# Patient Record
Sex: Female | Born: 1955 | Race: Black or African American | Hispanic: No | Marital: Single | Smoking: Never smoker
Health system: Southern US, Community
[De-identification: ages and names within clinical notes are randomized; demographics above are authoritative.]

## PROBLEM LIST (undated history)

## (undated) DIAGNOSIS — I10 Essential (primary) hypertension: Secondary | ICD-10-CM

## (undated) DIAGNOSIS — H409 Unspecified glaucoma: Secondary | ICD-10-CM

## (undated) DIAGNOSIS — F419 Anxiety disorder, unspecified: Secondary | ICD-10-CM

## (undated) DIAGNOSIS — T7840XA Allergy, unspecified, initial encounter: Secondary | ICD-10-CM

## (undated) DIAGNOSIS — M1990 Unspecified osteoarthritis, unspecified site: Secondary | ICD-10-CM

## (undated) HISTORY — DX: Allergy, unspecified, initial encounter: T78.40XA

## (undated) HISTORY — DX: Unspecified glaucoma: H40.9

## (undated) HISTORY — DX: Anxiety disorder, unspecified: F41.9

## (undated) HISTORY — DX: Essential (primary) hypertension: I10

## (undated) HISTORY — PX: LAPAROSCOPY: SHX197

## (undated) HISTORY — PX: DILATION AND CURETTAGE OF UTERUS: SHX78

---

## 1979-08-15 HISTORY — PX: OTHER SURGICAL HISTORY: SHX169

## 1999-06-29 ENCOUNTER — Emergency Department (HOSPITAL_COMMUNITY): Admission: EM | Admit: 1999-06-29 | Discharge: 1999-06-30 | Payer: Self-pay

## 1999-06-30 ENCOUNTER — Encounter: Payer: Self-pay | Admitting: Emergency Medicine

## 1999-06-30 ENCOUNTER — Ambulatory Visit (HOSPITAL_COMMUNITY): Admission: RE | Admit: 1999-06-30 | Discharge: 1999-06-30 | Payer: Self-pay | Admitting: Emergency Medicine

## 1999-07-09 ENCOUNTER — Other Ambulatory Visit: Admission: RE | Admit: 1999-07-09 | Discharge: 1999-07-09 | Payer: Self-pay | Admitting: Obstetrics & Gynecology

## 2000-04-15 ENCOUNTER — Emergency Department (HOSPITAL_COMMUNITY): Admission: EM | Admit: 2000-04-15 | Discharge: 2000-04-15 | Payer: Self-pay | Admitting: Emergency Medicine

## 2000-04-15 ENCOUNTER — Encounter: Payer: Self-pay | Admitting: Emergency Medicine

## 2000-04-24 ENCOUNTER — Encounter: Payer: Self-pay | Admitting: Emergency Medicine

## 2000-04-24 ENCOUNTER — Emergency Department (HOSPITAL_COMMUNITY): Admission: EM | Admit: 2000-04-24 | Discharge: 2000-04-24 | Payer: Self-pay | Admitting: Emergency Medicine

## 2000-09-20 ENCOUNTER — Other Ambulatory Visit: Admission: RE | Admit: 2000-09-20 | Discharge: 2000-09-20 | Payer: Self-pay | Admitting: Gynecology

## 2000-10-27 ENCOUNTER — Emergency Department (HOSPITAL_COMMUNITY): Admission: EM | Admit: 2000-10-27 | Discharge: 2000-10-27 | Payer: Self-pay | Admitting: Emergency Medicine

## 2000-10-29 ENCOUNTER — Encounter: Payer: Self-pay | Admitting: Family Medicine

## 2000-10-29 ENCOUNTER — Ambulatory Visit (HOSPITAL_COMMUNITY): Admission: RE | Admit: 2000-10-29 | Discharge: 2000-10-29 | Payer: Self-pay | Admitting: Family Medicine

## 2000-11-24 ENCOUNTER — Encounter: Payer: Self-pay | Admitting: Family Medicine

## 2000-11-24 ENCOUNTER — Ambulatory Visit (HOSPITAL_COMMUNITY): Admission: RE | Admit: 2000-11-24 | Discharge: 2000-11-24 | Payer: Self-pay | Admitting: Family Medicine

## 2001-10-30 ENCOUNTER — Encounter: Payer: Self-pay | Admitting: Emergency Medicine

## 2001-10-30 ENCOUNTER — Emergency Department (HOSPITAL_COMMUNITY): Admission: EM | Admit: 2001-10-30 | Discharge: 2001-10-30 | Payer: Self-pay | Admitting: Emergency Medicine

## 2001-11-01 ENCOUNTER — Other Ambulatory Visit: Admission: RE | Admit: 2001-11-01 | Discharge: 2001-11-01 | Payer: Self-pay | Admitting: Obstetrics & Gynecology

## 2001-11-17 ENCOUNTER — Emergency Department (HOSPITAL_COMMUNITY): Admission: EM | Admit: 2001-11-17 | Discharge: 2001-11-17 | Payer: Self-pay | Admitting: Emergency Medicine

## 2003-01-02 ENCOUNTER — Other Ambulatory Visit: Admission: RE | Admit: 2003-01-02 | Discharge: 2003-01-02 | Payer: Self-pay | Admitting: Obstetrics & Gynecology

## 2003-03-29 ENCOUNTER — Emergency Department (HOSPITAL_COMMUNITY): Admission: EM | Admit: 2003-03-29 | Discharge: 2003-03-29 | Payer: Self-pay | Admitting: Emergency Medicine

## 2004-01-18 ENCOUNTER — Other Ambulatory Visit: Admission: RE | Admit: 2004-01-18 | Discharge: 2004-01-18 | Payer: Self-pay | Admitting: Obstetrics & Gynecology

## 2005-01-20 ENCOUNTER — Other Ambulatory Visit: Admission: RE | Admit: 2005-01-20 | Discharge: 2005-01-20 | Payer: Self-pay | Admitting: Obstetrics & Gynecology

## 2006-03-02 ENCOUNTER — Other Ambulatory Visit: Admission: RE | Admit: 2006-03-02 | Discharge: 2006-03-02 | Payer: Self-pay | Admitting: Obstetrics & Gynecology

## 2007-05-23 ENCOUNTER — Emergency Department (HOSPITAL_COMMUNITY): Admission: EM | Admit: 2007-05-23 | Discharge: 2007-05-23 | Payer: Self-pay | Admitting: Emergency Medicine

## 2008-07-19 ENCOUNTER — Inpatient Hospital Stay (HOSPITAL_COMMUNITY): Admission: EM | Admit: 2008-07-19 | Discharge: 2008-07-21 | Payer: Self-pay | Admitting: Emergency Medicine

## 2008-07-26 ENCOUNTER — Ambulatory Visit: Admission: RE | Admit: 2008-07-26 | Discharge: 2008-07-26 | Payer: Self-pay | Admitting: Critical Care Medicine

## 2008-07-26 ENCOUNTER — Ambulatory Visit: Payer: Self-pay | Admitting: Vascular Surgery

## 2008-08-23 ENCOUNTER — Ambulatory Visit (HOSPITAL_COMMUNITY): Admission: RE | Admit: 2008-08-23 | Discharge: 2008-08-23 | Payer: Self-pay | Admitting: Critical Care Medicine

## 2010-04-26 ENCOUNTER — Emergency Department (HOSPITAL_COMMUNITY): Admission: EM | Admit: 2010-04-26 | Discharge: 2010-04-26 | Payer: Self-pay | Admitting: Emergency Medicine

## 2010-07-11 ENCOUNTER — Observation Stay (HOSPITAL_COMMUNITY): Admission: EM | Admit: 2010-07-11 | Discharge: 2010-07-12 | Payer: Self-pay | Admitting: Emergency Medicine

## 2010-07-11 ENCOUNTER — Encounter (INDEPENDENT_AMBULATORY_CARE_PROVIDER_SITE_OTHER): Payer: Self-pay | Admitting: Internal Medicine

## 2010-07-11 ENCOUNTER — Ambulatory Visit: Payer: Self-pay | Admitting: Cardiovascular Disease

## 2011-02-28 LAB — URINALYSIS, ROUTINE W REFLEX MICROSCOPIC
Bilirubin Urine: NEGATIVE
Glucose, UA: NEGATIVE mg/dL
Hgb urine dipstick: NEGATIVE
Ketones, ur: NEGATIVE mg/dL
Nitrite: NEGATIVE
Specific Gravity, Urine: 1.009 (ref 1.005–1.030)
Urobilinogen, UA: 1 mg/dL (ref 0.0–1.0)
pH: 5.5 (ref 5.0–8.0)

## 2011-02-28 LAB — CBC
Hemoglobin: 12.7 g/dL (ref 12.0–15.0)
MCH: 28.2 pg (ref 26.0–34.0)
MCHC: 34.4 g/dL (ref 30.0–36.0)
MCV: 81.9 fL (ref 78.0–100.0)
RBC: 4.51 MIL/uL (ref 3.87–5.11)

## 2011-02-28 LAB — BASIC METABOLIC PANEL
BUN: 8 mg/dL (ref 6–23)
CO2: 29 mEq/L (ref 19–32)
GFR calc Af Amer: >60 mL/min (ref >60)
Glucose, Bld: 90 mg/dL (ref 70–99)

## 2011-02-28 LAB — COMPREHENSIVE METABOLIC PANEL
Albumin: 4.3 g/dL (ref 3.5–5.2)
CO2: 24 mEq/L (ref 19–32)
Calcium: 9.3 mg/dL (ref 8.4–10.5)
Chloride: 101 mEq/L (ref 96–112)
GFR calc Af Amer: >60 mL/min (ref >60)
Potassium: 3.2 mEq/L — ABNORMAL LOW (ref 3.5–5.1)

## 2011-02-28 LAB — DIFFERENTIAL
Basophils Relative: 1 % (ref 0–1)
Eosinophils Absolute: 0.4 10*3/uL (ref 0.0–0.7)
Eosinophils Relative: 7 % — ABNORMAL HIGH (ref 0–5)
Lymphocytes Relative: 56 % — ABNORMAL HIGH (ref 12–46)

## 2011-02-28 LAB — LIPID PANEL: LDL Cholesterol: 142 mg/dL — ABNORMAL HIGH (ref 0–99)

## 2011-02-28 LAB — POCT CARDIAC MARKERS
Myoglobin, poc: 71.5 ng/mL (ref 12–200)
Troponin i, poc: <0.05 ng/mL (ref 0.00–0.09)

## 2011-02-28 LAB — CK TOTAL AND CKMB (NOT AT ARMC)
Relative Index: 1 (ref 0.0–2.5)
Total CK: 191 U/L — ABNORMAL HIGH (ref 7–177)

## 2011-02-28 LAB — RAPID URINE DRUG SCREEN, HOSP PERFORMED
Amphetamines: NOT DETECTED
Barbiturates: NOT DETECTED
Cocaine: NOT DETECTED

## 2011-02-28 LAB — CARDIAC PANEL(CRET KIN+CKTOT+MB+TROPI): Relative Index: 0.8 (ref 0.0–2.5)

## 2011-03-05 ENCOUNTER — Encounter (INDEPENDENT_AMBULATORY_CARE_PROVIDER_SITE_OTHER): Payer: Self-pay | Admitting: *Deleted

## 2011-03-12 NOTE — Letter (Signed)
Summary: Referral - not able to see patient  [facility]  [address] [place], [place] [address]   Phone: [phone]  Fax: [phone]    March 05, 2011   Re:   Nicole Reyes DOB:  Sep 25, 1956 MRN:   [id number]    Dear Dr. Elvina Sidle:  Thank you for your kind referral of the above patient.  We have attempted to schedule the recommended procedure Colonoscopy but have not been able to schedule because:  _X__ The patient was not available by phone and/or has not returned our calls.  ___ The patient declined to schedule the procedure at this time.  We appreciate the referral and hope that we will have the opportunity to treat this patient in the future.    Sincerely,  [facility] [phone]

## 2011-04-28 NOTE — Discharge Summary (Signed)
NAME:  Nicole Reyes, Nicole Reyes          ACCOUNT NO.:  [id number]   MEDICAL RECORD NO.:  [id number]          PATIENT TYPE:  OBV   LOCATION:  5511                         FACILITY:  [facility]   PHYSICIAN:  Hollice Espy, M.D.DATE OF BIRTH:  11-20-1956   DATE OF ADMISSION:  07/19/2008  DATE OF DISCHARGE:  07/21/2008                               DISCHARGE SUMMARY   PRIMARY CARE PHYSICIAN:  Elvera Lennox, PA, over at [facility].   DISCHARGE DIAGNOSES:  1. Pyelonephritis.  2. Acute renal failure, now resolved.  3. Nausea and vomiting, now resolved.  4. History of hypertension.  5. History of depression.  6. Incidentally found hydrosalpinx.   DISCHARGE MEDICATIONS:  1. Ceftin 500 mg p.o. b.i.d. x10 days.  2. Zofran 4 mg p.o. q.6 h. p.r.n.  3. Dilaudid 1 mg p.o. q.4 h. p.r.n. total number 30.  4. The patient will resume her Cymbalta 20 mg p.o. daily.  5. She is advised in terms of her hydrochlorothiazide 20 mg p.o. daily      and lisinopril 20 mg p.o. daily to hold on these medications and      resume on July 23, 2008.   DISCHARGE DIET:  Low-sodium diet.   ACTIVITIES:  Increase activity slowly.  She is advised not to return  back to work until sometime after no earlier than July 25, 2008.   HOSPITAL COURSE:  The patient is a 55 year old African American female  with past medical history of hypertension and depression who presented  with several days of back pain and nausea and vomiting.  When she first  presented to the emergency room, she was found have a white count of 20.  CT scan showed an incidentally found hydrosalpinx, which her OB/GYN felt  a nonspecific finding, but she had a very significant UTI and signs of  pyelonephritis by CT scan.  The patient was started on IV Rocephin and  IV fluids.  She was found to also be an acute renal failure.  By  hospital day #2, the patient was feeling markedly better.  Her renal  failure had resolved.  However, her white  count actually increased up to  25.  Because she was clinically feeling better, I felt that this was  more of a delayed response.  A repeat CBC approximately 12 hours later  showed a mild decrease in her white count from 25 down to 24 and again  she remained afebrile.  By July 21, 2008, the patient was feeling much  better.  Her white count had come down to 16.  She had a brief episode  of back pain early, which was treated with pain medication.  She  currently is feeling her self.  She is able to ambulate comfortably and  would like to go home.  Given her rapid improvement and the fact that  she is felt to be stable, I felt that she is appropriate to be  discharged.  In addition, because of her blood pressure, her medications  have been held.  Her blood pressure has been around 110 without any  medications and I am advising  her to go home, resume a normal diet, and  to resume her antihypertensives after several days.  Plan will be for  the patient to go home on Ceftin for 5 mg p.o. b.i.d. x10 more  days for a total of 13 days of therapy.  The patient's again activity  slowly increased and I am advising her to not resume back works for the  next 3-4 days.  She will follow up with her PCP, Elvera Lennox at [facility]  after she has completed her antibiotic course.  The patient's overall  disposition is improved and she is being discharged to home.      Hollice Espy, M.D.  Electronically Signed     SKK/MEDQ  D:  07/21/2008  T:  07/22/2008  Job:  [id number]   cc:   Elvera Lennox

## 2011-04-28 NOTE — H&P (Signed)
NAME:  Nicole Reyes, Nicole Reyes          ACCOUNT NO.:  [id number]   MEDICAL RECORD NO.:  [id number]          PATIENT TYPE:  OBV   LOCATION:  5511                         FACILITY:  [facility]   PHYSICIAN:  Hollice Espy, M.D.DATE OF BIRTH:  Jul 26, 1956   DATE OF ADMISSION:  07/19/2008  DATE OF DISCHARGE:                              HISTORY & PHYSICAL   ATTENDING PHYSICIAN:  Virginia Rochester, MD   PRIMARY CARE PHYSICIAN:  Hyacinth Meeker, PAC at [place] [facility]   CHIEF COMPLAINT:  Back pain, nausea, vomiting.   HISTORY OF PRESENT ILLNESS:  The patient is a 55 year old African  American female with past medical history of hypertension, depression  and asthma who for the past several days has had problems with worsening  lower back pain, some dysuria and is having severe nausea, vomiting.  It  got to the point where she could not take it anymore and came into the  emergency room for further evaluation. In the emergency room, the  patient was found by blood work to have a white count of 20.6 with a 93%  shift.  She also has a very large urinary tract infection.  She also is  slightly dehydrated with a BUN of 14 and creatinine of 1.2.  A  transvaginal ultrasound was done which showed a probable left  hydrosalpinx and uterine fibroids, but the CT of the abdomen, pelvis  noted left ureter ectasis to the level of ureteral orifice without  definite calculus, related to recent stone passage versus  pyelonephritis. ER spoke with the patient's OB/GYN on call and they  likely said that given her age that the left hydrosalpinx is probably an  incidental finding and her findings can be more consistent with a  pyelonephritis. Currently, the patient was given medication for pain and  nausea, IV fluids and a dose of IV Rocephin.  She is feeling a little  bit better.  She denies any headaches, vision changes or dysphagia.  She  has some mild nausea, but this is improved. No chest pain or shortness  of breath or palpitations.  No wheeze, cough and she denies any  abdominal pain, but does note some lower back pain.  She was complaining  of some dysuria earlier. No focal extremity numbness, weakness or pain  and other overall she feels quite fatigued.  Review of systems otherwise  negative.   PAST MEDICAL HISTORY:  1. Hypertension.  2. Asthma.  3. Depression.   MEDICATIONS:  1. Citalopram.  2. Hydrochlorothiazide.  3. Lisinopril.  4. Advair.  5. Albuterol.  6. Ziac.   ALLERGIES:  NO KNOWN DRUG ALLERGIES.   SOCIAL HISTORY:  No tobacco, alcohol or drug use.   FAMILY HISTORY:  Noncontributory.   PHYSICAL EXAM:  The patient's vitals on admission are temperature 97.3,  heart rate 97, blood pressure 113/62, respirations 18, O2 sat 97% on  room air.  GENERAL: She is alert and x3.  No apparent distress.  HEENT: Normocephalic, atraumatic.  Mucous membranes slightly dry.  She  has no carotid bruits.  HEART:  Regular rate and rhythm, S1-S2.  LUNGS:  Clear to auscultation  bilaterally.  ABDOMEN:  Soft, obese, nontender, positive bowel sounds.  EXTREMITIES:  Show no clubbing, cyanosis or edema.   LAB WORK:  CT and transvaginal ultrasound are as per HPI.  White count  20.6, H&H 13.7 and 41, MCV 81, platelet count 221, 93% shift.  UA notes  large hemoglobin and 100 protein, nitrite positive, large leukocyte  esterase with too numerous to count white cells, 11 to 20 red cells,  many bacteria and few clue cells seen on wet prep.   ASSESSMENT/PLAN:  1. Pyelonephritis.  Will treat with IV Rocephin, pain and nausea      medications p.r.n.  2. Asthma.  Will treat with p.r.n. albuterol.  3. Hypertension.  Hold meds secondary to pyelonephritis and infection      and dehydration.      Hollice Espy, M.D.  Electronically Signed     SKK/MEDQ  D:  07/19/2008  T:  07/19/2008  Job:  [id number]   cc:   Hyacinth Meeker, PAC

## 2011-05-21 ENCOUNTER — Encounter: Payer: Self-pay | Admitting: Internal Medicine

## 2011-06-15 ENCOUNTER — Ambulatory Visit (AMBULATORY_SURGERY_CENTER): Payer: PRIVATE HEALTH INSURANCE | Admitting: *Deleted

## 2011-06-15 VITALS — Ht 65.0 in | Wt 223.4 lb

## 2011-06-15 DIAGNOSIS — Z1211 Encounter for screening for malignant neoplasm of colon: Secondary | ICD-10-CM

## 2011-06-15 MED ORDER — PEG-KCL-NACL-NASULF-NA ASC-C 100 G PO SOLR: ORAL | Status: DC

## 2011-06-29 ENCOUNTER — Ambulatory Visit (AMBULATORY_SURGERY_CENTER): Payer: PRIVATE HEALTH INSURANCE | Admitting: Internal Medicine

## 2011-06-29 ENCOUNTER — Encounter: Payer: Self-pay | Admitting: Internal Medicine

## 2011-06-29 VITALS — BP 142/91 | HR 64 | Temp 98.4°F | Resp 21 | Ht 66.0 in | Wt 215.0 lb

## 2011-06-29 DIAGNOSIS — Z1211 Encounter for screening for malignant neoplasm of colon: Secondary | ICD-10-CM

## 2011-06-29 HISTORY — PX: COLONOSCOPY: SHX174

## 2011-06-29 MED ORDER — SODIUM CHLORIDE 0.9 % IV SOLN: 500.0000 mL | INTRAVENOUS | Status: DC

## 2011-06-29 NOTE — Patient Instructions (Signed)
Your screening colonoscopy was normal. Your next routine preventive colonoscopy should be in about 10 years, or 2022.  In addition to repeating colonoscopy, changing health habits may reduce your risk of having colon or rectal  polyps and possibly, colorectal cancer. You may lower your risk of future polyps and colorectal cancer by adopting healthy habits such as not smoking or using tobacco (if you do), being physically active, losing weight (if overweight), and eating a diet which includes fruits and vegetables and limits red meat.

## 2011-06-30 ENCOUNTER — Telehealth: Payer: Self-pay

## 2011-06-30 NOTE — Telephone Encounter (Signed)
I left a message on her hm a.m. To call if any questions or concerns. MAW

## 2011-09-11 LAB — DIFFERENTIAL
Basophils Absolute: 0
Basophils Relative: 0
Eosinophils Absolute: 0.1
Lymphs Abs: 1
Neutrophils Relative %: 93 — ABNORMAL HIGH

## 2011-09-11 LAB — CBC
Hemoglobin: 12.5
Hemoglobin: 13.1
MCHC: 33.3
MCHC: 33.8
MCV: 81.4
MCV: 82.6
Platelets: 157
Platelets: 221
RBC: 4.49
RBC: 4.59
RBC: 4.75
RDW: 13.4
RDW: 13.5
RDW: 13.5
WBC: 20.6 — ABNORMAL HIGH

## 2011-09-11 LAB — GC/CHLAMYDIA PROBE AMP, GENITAL: GC Probe Amp, Genital: NEGATIVE

## 2011-09-11 LAB — BASIC METABOLIC PANEL
Calcium: 9.1
GFR calc Af Amer: 59 — ABNORMAL LOW
GFR calc non Af Amer: 49 — ABNORMAL LOW
Sodium: 142

## 2011-09-11 LAB — URINALYSIS, ROUTINE W REFLEX MICROSCOPIC
Protein, ur: 100 — AB
Urobilinogen, UA: 0.2

## 2011-09-11 LAB — POCT I-STAT, CHEM 8
BUN: 14
Hemoglobin: 14.6
Sodium: 142
TCO2: 29

## 2011-09-11 LAB — COMPREHENSIVE METABOLIC PANEL
ALT: 22
Alkaline Phosphatase: 104
CO2: 28
Calcium: 9
Chloride: 106
GFR calc non Af Amer: 51 — ABNORMAL LOW
Glucose, Bld: 124 — ABNORMAL HIGH
Sodium: 140
Total Bilirubin: 0.9

## 2011-09-11 LAB — POCT PREGNANCY, URINE: Preg Test, Ur: NEGATIVE

## 2011-09-11 LAB — URINE MICROSCOPIC-ADD ON

## 2011-09-11 LAB — PROTIME-INR: Prothrombin Time: 13.9

## 2011-09-11 LAB — WET PREP, GENITAL

## 2012-01-06 ENCOUNTER — Ambulatory Visit (INDEPENDENT_AMBULATORY_CARE_PROVIDER_SITE_OTHER): Payer: BC Managed Care – PPO

## 2012-01-06 DIAGNOSIS — J158 Pneumonia due to other specified bacteria: Secondary | ICD-10-CM

## 2012-01-06 DIAGNOSIS — J45902 Unspecified asthma with status asthmaticus: Secondary | ICD-10-CM

## 2012-04-23 ENCOUNTER — Other Ambulatory Visit: Payer: Self-pay | Admitting: Physician Assistant

## 2012-05-02 ENCOUNTER — Other Ambulatory Visit: Payer: Self-pay | Admitting: Physician Assistant

## 2012-05-05 ENCOUNTER — Other Ambulatory Visit: Payer: Self-pay | Admitting: Physician Assistant

## 2012-06-06 ENCOUNTER — Ambulatory Visit (INDEPENDENT_AMBULATORY_CARE_PROVIDER_SITE_OTHER): Payer: BC Managed Care – PPO | Admitting: Family Medicine

## 2012-06-06 VITALS — BP 153/81 | HR 76 | Temp 98.2°F | Resp 16 | Ht 65.75 in | Wt 204.4 lb

## 2012-06-06 DIAGNOSIS — I10 Essential (primary) hypertension: Secondary | ICD-10-CM

## 2012-06-06 DIAGNOSIS — J45909 Unspecified asthma, uncomplicated: Secondary | ICD-10-CM

## 2012-06-06 DIAGNOSIS — Z0000 Encounter for general adult medical examination without abnormal findings: Secondary | ICD-10-CM

## 2012-06-06 LAB — POCT CBC
HCT, POC: 41 % (ref 37.7–47.9)
Hemoglobin: 13.4 g/dL (ref 12.2–16.2)
Lymph, poc: 2.3 (ref 0.6–3.4)
MCH, POC: 26.6 pg — AB (ref 27–31.2)
MCHC: 32.7 g/dL (ref 31.8–35.4)
MCV: 81.6 fL (ref 80–97)
RBC: 5.03 M/uL (ref 4.04–5.48)
WBC: 5.3 10*3/uL (ref 4.6–10.2)

## 2012-06-06 MED ORDER — AMLODIPINE BESYLATE 10 MG PO TABS: 10.0000 mg | ORAL_TABLET | Freq: Every day | ORAL | Status: DC

## 2012-06-06 MED ORDER — BECLOMETHASONE DIPROPIONATE 80 MCG/ACT IN AERS: 1.0000 | INHALATION_SPRAY | RESPIRATORY_TRACT | Status: DC | PRN

## 2012-06-06 MED ORDER — ALBUTEROL SULFATE HFA 108 (90 BASE) MCG/ACT IN AERS: INHALATION_SPRAY | RESPIRATORY_TRACT | Status: DC

## 2012-06-06 MED ORDER — PREDNISONE 20 MG PO TABS: ORAL_TABLET | ORAL | Status: DC

## 2012-06-06 NOTE — Progress Notes (Signed)
Patient Name: Nicole Reyes Date of Birth: 20-Mar-1956 Medical Record Number: [id number] Gender: female Date of Encounter: 06/06/2012  History of Present Illness:  Nicole Reyes is a 56 y.o. very pleasant female patient who presents with the following:  Here today for a PE and also to address her asthma and HTN.   Her asthma is flared up right now; it has been worse for about a week.  She has not been on a maintenence inhaler for about 3 weeks, she also ran out of her ventolin about a week ago.  She has not had a fever.  Nicole Reyes has had trouble paying for her inhaled steroids and so she stopped taking it.   When she is well she "rarely" needs her albuterol inhaler.   She has been out of her norvasc for about 2 days- states that her BP was "fine" when she was on the medication.   Her OB-Gyn does her paps and breast exam. Normal colonoscopy last year.  Mammogram yearly as well- looking ok.    TDaP UTD.    There is no problem list on file for this patient.  Past Medical History  Diagnosis Date  . Asthma   . Anxiety   . Hypertension   . Allergy    Past Surgical History  Procedure Date  . Laparoscopy     REMOVED SCARE TISSUE FROM OVARIES  . Dilation and curettage of uterus   . Colonoscopy&endoscopy 1980's    PT UNSURE PLACE&MD WHO DID EXAMS=NORMAL  . Colonoscopy 06/29/2011    Normal   History  Substance Use Topics  . Smoking status: Never Smoker   . Smokeless tobacco: Never Used  . Alcohol Use: No   Family History  Problem Relation Age of Onset  . Colon cancer Neg Hx    No Known Allergies  Medication list has been reviewed and updated.  Prior to Admission medications   Medication Sig Start Date End Date Taking? Authorizing Provider  ALPRAZolam Prudy Feeler) 0.25 MG tablet Take 1 tablet by mouth as needed. 05/29/11  Yes Historical Provider, MD  amLODipine (NORVASC) 10 MG tablet TAKE 1 TABLET DAILY AS DIRECTED. 05/05/12  Yes Chelle S Jeffery, PA-C  aspirin 81  MG tablet Take 81 mg by mouth daily.     Yes Historical Provider, MD  fluticasone (FLONASE) 50 MCG/ACT nasal spray Inhale 1 Inhaler into the lungs as needed. 06/14/11  Yes Historical Provider, MD  VENTOLIN HFA 108 (90 BASE) MCG/ACT inhaler Inhale 2 Inhalers into the lungs 4 (four) times daily as needed. 06/14/11  Yes Historical Provider, MD  zolpidem (AMBIEN) 5 MG tablet Take 1 tablet by mouth At bedtime as needed. 05/29/11  Yes Historical Provider, MD  HYDROcodone-homatropine [place]) 5-1.5 MG/5ML syrup  06/23/11   Historical Provider, MD    Review of Systems:  As per HPI- otherwise negative.   Physical Examination: Filed Vitals:   06/06/12 1310  BP: 153/81  Pulse: 76  Temp: 98.2 F (36.8 C)  Resp: 16   Filed Vitals:   06/06/12 1310  Height: 5' 5.75" (1.67 m)  Weight: 204 lb 6.4 oz (92.715 kg)   Body mass index is 33.24 kg/(m^2). Ideal Body Weight: Weight in (lb) to have BMI = 25: 153.4   GEN: WDWN, NAD, Non-toxic, A & O x 3, obese HEENT: Atraumatic, Normocephalic. Neck supple. No masses, No LAD.  TM and oropharynx wnl, PEERL, EOMI Ears and Nose: No external deformity. CV: RRR, No M/G/R. No JVD. No thrill. No  extra heart sounds. PULM: CTA B, no crackles, rhonchi. No retractions. No resp. distress. No accessory muscle use. Minimal wheezes bilaterlaly.  ABD: S, NT, ND, +BS. No rebound. No HSM. EXTR: No c/c/e NEURO Normal gait.  PSYCH: Normally interactive. Conversant. Not depressed or anxious appearing.  Calm demeanor.    Assessment and Plan: 1. Physical exam, annual  POCT CBC, Comprehensive metabolic panel, TSH, Lipid panel  2. Asthma  albuterol (VENTOLIN HFA) 108 (90 BASE) MCG/ACT inhaler, beclomethasone (QVAR) 80 MCG/ACT inhaler, predniSONE (DELTASONE) 20 MG tablet  3. HTN (hypertension)  amLODipine (NORVASC) 10 MG tablet   Await labs.  Health maintenance services are UTD.   Asthma; likely needs to be on an inhaled steriod chronically.  Rx Qvar 80 for her- also gave coupon.   Will treat with a 3 day prednisone burst for current asthma flare up.   Refilled Norvasc she will restart this medication Nicole Detwiler, MD

## 2012-06-07 ENCOUNTER — Encounter: Payer: Self-pay | Admitting: Family Medicine

## 2012-06-07 LAB — COMPREHENSIVE METABOLIC PANEL WITH GFR
ALT: 23 U/L (ref 0–35)
AST: 31 U/L (ref 0–37)
Albumin: 4.6 g/dL (ref 3.5–5.2)
Alkaline Phosphatase: 145 U/L — ABNORMAL HIGH (ref 39–117)
BUN: 10 mg/dL (ref 6–23)
CO2: 28 meq/L (ref 19–32)
Calcium: 9.8 mg/dL (ref 8.4–10.5)
Chloride: 108 meq/L (ref 96–112)
Creat: 0.93 mg/dL (ref 0.50–1.10)
Glucose, Bld: 89 mg/dL (ref 70–99)
Potassium: 4.3 meq/L (ref 3.5–5.3)
Sodium: 142 meq/L (ref 135–145)
Total Bilirubin: 0.7 mg/dL (ref 0.3–1.2)
Total Protein: 7.5 g/dL (ref 6.0–8.3)

## 2012-06-07 LAB — LIPID PANEL
Cholesterol: 190 mg/dL (ref 0–200)
HDL: 47 mg/dL (ref >39)
Total CHOL/HDL Ratio: 4 Ratio

## 2012-06-07 LAB — TSH: TSH: 1.395 u[IU]/mL (ref 0.350–4.500)

## 2012-06-07 NOTE — Addendum Note (Signed)
Addended by: Abbe Amsterdam C on: 06/07/2012 08:39 PM   Modules accepted: Orders

## 2012-12-17 ENCOUNTER — Telehealth: Payer: Self-pay

## 2012-12-17 NOTE — Telephone Encounter (Signed)
Pt called needing something to be called into pharmacy for cough.  [facility] [address]  Best # for pt [phone]. Please let pt know if can be done.

## 2012-12-19 ENCOUNTER — Ambulatory Visit (INDEPENDENT_AMBULATORY_CARE_PROVIDER_SITE_OTHER): Payer: PRIVATE HEALTH INSURANCE | Admitting: Family Medicine

## 2012-12-19 ENCOUNTER — Ambulatory Visit: Payer: PRIVATE HEALTH INSURANCE

## 2012-12-19 VITALS — BP 138/89 | HR 86 | Temp 98.0°F | Resp 16 | Ht 66.0 in | Wt 208.0 lb

## 2012-12-19 DIAGNOSIS — J4540 Moderate persistent asthma, uncomplicated: Secondary | ICD-10-CM | POA: Insufficient documentation

## 2012-12-19 DIAGNOSIS — R059 Cough, unspecified: Secondary | ICD-10-CM

## 2012-12-19 DIAGNOSIS — R05 Cough: Secondary | ICD-10-CM

## 2012-12-19 DIAGNOSIS — R52 Pain, unspecified: Secondary | ICD-10-CM

## 2012-12-19 DIAGNOSIS — I10 Essential (primary) hypertension: Secondary | ICD-10-CM | POA: Insufficient documentation

## 2012-12-19 DIAGNOSIS — J111 Influenza due to unidentified influenza virus with other respiratory manifestations: Secondary | ICD-10-CM

## 2012-12-19 DIAGNOSIS — R509 Fever, unspecified: Secondary | ICD-10-CM

## 2012-12-19 DIAGNOSIS — J45909 Unspecified asthma, uncomplicated: Secondary | ICD-10-CM

## 2012-12-19 DIAGNOSIS — R55 Syncope and collapse: Secondary | ICD-10-CM

## 2012-12-19 DIAGNOSIS — R110 Nausea: Secondary | ICD-10-CM

## 2012-12-19 LAB — POCT CBC
Hemoglobin: 13.3 g/dL (ref 12.2–16.2)
Lymph, poc: 2 (ref 0.6–3.4)
MCH, POC: 25.9 pg — AB (ref 27–31.2)
MCHC: 31.3 g/dL — AB (ref 31.8–35.4)
MID (cbc): 0.4 (ref 0–0.9)
MPV: 9.4 fL (ref 0–99.8)
POC Granulocyte: 1.6 — AB (ref 2–6.9)
POC MID %: 10.1 %M (ref 0–12)
Platelet Count, POC: 316 10*3/uL (ref 142–424)
RBC: 5.14 M/uL (ref 4.04–5.48)
WBC: 4 10*3/uL — AB (ref 4.6–10.2)

## 2012-12-19 LAB — GLUCOSE, POCT (MANUAL RESULT ENTRY): POC Glucose: 86 mg/dl (ref 70–99)

## 2012-12-19 MED ORDER — ONDANSETRON HCL 8 MG PO TABS: 8.0000 mg | ORAL_TABLET | Freq: Three times a day (TID) | ORAL | Status: DC | PRN

## 2012-12-19 MED ORDER — OSELTAMIVIR PHOSPHATE 75 MG PO CAPS: 75.0000 mg | ORAL_CAPSULE | Freq: Two times a day (BID) | ORAL | Status: DC

## 2012-12-19 MED ORDER — ONDANSETRON 4 MG PO TBDP
8.0000 mg | ORAL_TABLET | Freq: Once | ORAL | Status: AC
  Administered 2012-12-19: 8 mg via ORAL

## 2012-12-19 NOTE — Progress Notes (Signed)
[facility] [address] [place] [address] [phone]  Date:  12/19/2012   Name:  Nicole Reyes   DOB:  04-30-1956   MRN:  [id number]  PCP:  Elvina Sidle, MD    Chief Complaint: Cough, Generalized Body Aches, Fever and Nausea   History of Present Illness:  Nicole Reyes is a 57 y.o. very pleasant female patient who presents with the following:  About one month ago she had a cold that lasted for a few weeks.  She got better, but last week (on Thursday) she had return of cough, chills, body aches, HA.  Last night she coughed a lot and felt that she could not breathe well.  She got up this morning and wanted to go to work but was not sure if she could make it. She laid down in bed and noted onset of right sided/ substernal CP.  She thought it might be heartburn and started to stand up to get some medication- she then passed out for an undetermined amount of time.  She lives alone and is not sure how long she was out.  She woke up on the floor of her room. She hurt her lip when she fell but does not think she otherwise injured her head.  This occurred around 4:45 this morning.    She has been using some aspirin for aches and pains recently.  She does not have any CP at this time.    She has not taken any medication yet this morning and has not eaten either.  She feels nauseated but has not vomited.    She is not aware of any other significant injuries from her fall.    She has a history of HTN and asthma.    She has never passed out in the past.    She has been drinking water over the last couple of days not but eating anything.  She drove herself here today  There is no problem list on file for this patient.   Past Medical History  Diagnosis Date  . Asthma   . Anxiety   . Hypertension   . Allergy     Past Surgical History  Procedure Date  . Laparoscopy     REMOVED SCARE TISSUE FROM OVARIES  . Dilation and curettage of uterus   .  Colonoscopy&endoscopy 1980's    PT UNSURE PLACE&MD WHO DID EXAMS=NORMAL  . Colonoscopy 06/29/2011    Normal    History  Substance Use Topics  . Smoking status: Never Smoker   . Smokeless tobacco: Never Used  . Alcohol Use: No    Family History  Problem Relation Age of Onset  . Colon cancer Neg Hx   . Alcohol abuse Father   . Hypertension Mother   . Asthma Mother   . Hypertension Sister   . Hypertension Brother   . Hypertension Sister   . Hypertension Sister   . Hypertension Sister   . Hypertension Brother     No Known Allergies  Medication list has been reviewed and updated.  Current Outpatient Prescriptions on File Prior to Visit  Medication Sig Dispense Refill  . amLODipine (NORVASC) 10 MG tablet Take 1 tablet (10 mg total) by mouth daily.  30 tablet  11  . aspirin 81 MG tablet Take 81 mg by mouth daily.        Marland Kitchen albuterol (VENTOLIN HFA) 108 (90 BASE) MCG/ACT inhaler Inhale 2 puffs every 4 or 6 hours as needed  18 g  11  . beclomethasone (QVAR) 80 MCG/ACT inhaler Inhale 1 puff into the lungs as needed.  1 Inhaler  12  . fluticasone (FLONASE) 50 MCG/ACT nasal spray Inhale 1 Inhaler into the lungs as needed.      Marland Kitchen HYDROcodone-homatropine (HYCODAN) 5-1.5 MG/5ML syrup       . predniSONE (DELTASONE) 20 MG tablet Take 2 pills daily for 3 days  6 tablet  0   Current Facility-Administered Medications on File Prior to Visit  Medication Dose Route Frequency Provider Last Rate Last Dose  . 0.9 %  sodium chloride infusion  500 mL Intravenous Continuous Iva Boop, MD        Review of Systems:  As per HPI- otherwise negative.  Physical Examination: Filed Vitals:   12/19/12 1409  BP: 138/89  Pulse: 86  Temp: 98 F (36.7 C)  Resp: 16   Filed Vitals:   12/19/12 1409  Height: 5\' 6"  (1.676 m)  Weight: 208 lb (94.348 kg)   Body mass index is 33.57 kg/(m^2). Ideal Body Weight: Weight in (lb) to have BMI = 25: 154.6   GEN: WDWN, NAD, Non-toxic, A & O x 3,  overweight HEENT: Atraumatic, Normocephalic. Neck supple. No masses, No LAD. Bilateral TM wnl, oropharynx normal.  PEERL,EOMI.   Ears and Nose: No external deformity. CV: RRR, No M/G/R. No JVD. No thrill. No extra heart sounds. PULM: CTA B, no wheezes, crackles, rhonchi. No retractions. No resp. distress. No accessory muscle use. ABD: S, ND, +BS. No rebound. No HSM. She has mild epigastric tenderness  EXTR: No c/c/e NEURO Normal gait.  PSYCH: Normally interactive. Conversant. Not depressed or anxious appearing.  Calm demeanor.   [facility] reading (PRIMARY) by  Dr. Patsy Lager. CXR: no active disease, but she does have a peri- cardiac fat pad  CHEST - 2 VIEW  Comparison: None.  Findings: Cardiomediastinal silhouette appears normal. No acute pulmonary disease is noted. Bony thorax is intact.  IMPRESSION: No acute cardiopulmonary abnormality seen.  EKG: NSR, rate 74 BPM  Results for orders placed in visit on 12/19/12  POCT CBC      Component Value Range   WBC 4.0 (*) 4.6 - 10.2 K/uL   Lymph, poc 2.0  0.6 - 3.4   POC LYMPH PERCENT 49.4  10 - 50 %L   MID (cbc) 0.4  0 - 0.9   POC MID % 10.1  0 - 12 %M   POC Granulocyte 1.6 (*) 2 - 6.9   Granulocyte percent 40.5  37 - 80 %G   RBC 5.14  4.04 - 5.48 M/uL   Hemoglobin 13.3  12.2 - 16.2 g/dL   HCT, POC 40.9  81.1 - 47.9 %   MCV 82.6  80 - 97 fL   MCH, POC 25.9 (*) 27 - 31.2 pg   MCHC 31.3 (*) 31.8 - 35.4 g/dL   RDW, POC 91.4     Platelet Count, POC 316  142 - 424 K/uL   MPV 9.4  0 - 99.8 fL  GLUCOSE, POCT (MANUAL RESULT ENTRY)      Component Value Range   POC Glucose 86  70 - 99 mg/dl  POCT INFLUENZA A/B      Component Value Range   Influenza A, POC Positive     Influenza B, POC Negative      Assessment and Plan: 1. Syncope  EKG 12-Lead, POCT CBC, POCT glucose (manual entry), POCT Influenza A/B, ondansetron (ZOFRAN-ODT) disintegrating tablet 8 mg  2.  Cough  DG Chest 2 View, POCT CBC, POCT Influenza A/B  3. Fever  POCT CBC, POCT  Influenza A/B  4. Body aches  POCT CBC, POCT Influenza A/B, ondansetron (ZOFRAN-ODT) disintegrating tablet 8 mg  5. Flu  oseltamivir (TAMIFLU) 75 MG capsule  6. Asthma    7. HTN (hypertension)     Nicole Reyes is ill today with the flu.  However, she also had an episode of CP and then passed out earlier today.  Discussed the likelihood that this was due to GERD and influenza.  However, we cannot be 100% certain that there was not a cardiac issue.  She would like to have further evaluation at the ED.  She plans to have a friend drive her to the ED.  Declines EMS transport  Meds ordered this encounter  Medications  . oseltamivir (TAMIFLU) 75 MG capsule    Sig: Take 1 capsule (75 mg total) by mouth 2 (two) times daily.    Dispense:  10 capsule    Refill:  0  . ondansetron (ZOFRAN-ODT) disintegrating tablet 8 mg    Sig:   . ondansetron (ZOFRAN) 8 MG tablet    Sig: Take 1 tablet (8 mg total) by mouth every 8 (eight) hours as needed for nausea.    Dispense:  15 tablet    Refill:  0     Coretta Leisey, MD

## 2012-12-19 NOTE — Patient Instructions (Addendum)
You have the flu.  Rest and drink plenty of fluids.    Please proceed to the ED of your choice for further evaluation.  You may want to try [facility]  [address], [place], [place] [address]   After you are released from the hospital be sure to take it easy.  Get up slowly from sitting or standing. Have a friend or family member check in on you.  If you are not better in the next 24 hours or if you get worse please seek additional care.  It seems that you were having heart burn- purchase an OTC acid reducer such as pepcid or prilosec. However, if you have return of chest pain seek help right away.

## 2012-12-19 NOTE — Telephone Encounter (Signed)
Called pt, she has to come in for cough medicine. LMOM to let her know.

## 2013-01-22 ENCOUNTER — Telehealth: Payer: Self-pay

## 2013-01-22 NOTE — Telephone Encounter (Signed)
Spoke with pt, she states she has been having this cough and she hears herself wheezing at night and in the day. She coughs all day long especially at night. She has taken nightime cough syrup and mucinex without any relief. She was here and was diagnosed with the flu. She was given Tamiflu but no cough medication. Can we Rx her something. Please advise.

## 2013-01-22 NOTE — Telephone Encounter (Signed)
Pt is needing to talk with someone about her cough and would like to know if something could be called in   Best number [phone]

## 2013-01-23 NOTE — Telephone Encounter (Signed)
Pt was seen over 1 month ago for flu, she needs to RTC if still having symptoms

## 2013-01-24 ENCOUNTER — Ambulatory Visit (INDEPENDENT_AMBULATORY_CARE_PROVIDER_SITE_OTHER): Payer: PRIVATE HEALTH INSURANCE | Admitting: Emergency Medicine

## 2013-01-24 ENCOUNTER — Ambulatory Visit: Payer: PRIVATE HEALTH INSURANCE

## 2013-01-24 VITALS — BP 155/85 | HR 80 | Temp 98.1°F | Resp 16 | Ht 65.0 in | Wt 211.0 lb

## 2013-01-24 DIAGNOSIS — R05 Cough: Secondary | ICD-10-CM

## 2013-01-24 DIAGNOSIS — J45901 Unspecified asthma with (acute) exacerbation: Secondary | ICD-10-CM

## 2013-01-24 MED ORDER — IPRATROPIUM BROMIDE 0.02 % IN SOLN
0.5000 mg | Freq: Once | RESPIRATORY_TRACT | Status: AC
  Administered 2013-01-24: 0.5 mg via RESPIRATORY_TRACT

## 2013-01-24 MED ORDER — ALBUTEROL SULFATE (2.5 MG/3ML) 0.083% IN NEBU
2.5000 mg | INHALATION_SOLUTION | Freq: Once | RESPIRATORY_TRACT | Status: AC
  Administered 2013-01-24: 2.5 mg via RESPIRATORY_TRACT

## 2013-01-24 MED ORDER — ALBUTEROL SULFATE (2.5 MG/3ML) 0.083% IN NEBU: 2.5000 mg | INHALATION_SOLUTION | RESPIRATORY_TRACT | Status: DC | PRN

## 2013-01-24 NOTE — Progress Notes (Signed)
[facility] [address] [place] [address] [phone]  Date:  01/24/2013   Name:  Nicole Reyes   DOB:  23-Jan-1956   MRN:  [id number]  PCP:  Elvina Sidle, MD    Chief Complaint: Cough and Wheezing   History of Present Illness:  Nicole Reyes is a 57 y.o. very pleasant female patient who presents with the following:  Ill with influenza.  Recovered but has residual cough that is productive for mucoid sputum. Occasionally blood tinged.  Moderate wheezing.  No shortness of breath.  No nasal drainage or congestion.  No nausea or vomiting.  No stool change or rash.  No improvement with OTC meds.  Cough present for two weeks.  Patient Active Problem List  Diagnosis  . Asthma  . HTN (hypertension)    Past Medical History  Diagnosis Date  . Asthma   . Anxiety   . Hypertension   . Allergy     Past Surgical History  Procedure Laterality Date  . Laparoscopy      REMOVED SCARE TISSUE FROM OVARIES  . Dilation and curettage of uterus    . Colonoscopy&endoscopy  1980's    PT UNSURE PLACE&MD WHO DID EXAMS=NORMAL  . Colonoscopy  06/29/2011    Normal    History  Substance Use Topics  . Smoking status: Never Smoker   . Smokeless tobacco: Never Used  . Alcohol Use: No    Family History  Problem Relation Age of Onset  . Colon cancer Neg Hx   . Alcohol abuse Father   . Hypertension Mother   . Asthma Mother   . Hypertension Sister   . Hypertension Brother   . Hypertension Sister   . Hypertension Sister   . Hypertension Sister   . Hypertension Brother     No Known Allergies  Medication list has been reviewed and updated.  Current Outpatient Prescriptions on File Prior to Visit  Medication Sig Dispense Refill  . amLODipine (NORVASC) 10 MG tablet Take 1 tablet (10 mg total) by mouth daily.  30 tablet  11  . aspirin 81 MG tablet Take 81 mg by mouth daily.        . beclomethasone (QVAR) 80 MCG/ACT inhaler Inhale 1 puff into the lungs  as needed.  1 Inhaler  12  . ondansetron (ZOFRAN) 8 MG tablet Take 1 tablet (8 mg total) by mouth every 8 (eight) hours as needed for nausea.  15 tablet  0  . albuterol (VENTOLIN HFA) 108 (90 BASE) MCG/ACT inhaler Inhale 2 puffs every 4 or 6 hours as needed  18 g  11  . fluticasone (FLONASE) 50 MCG/ACT nasal spray Inhale 1 Inhaler into the lungs as needed.      Marland Kitchen HYDROcodone-homatropine (HYCODAN) 5-1.5 MG/5ML syrup       . oseltamivir (TAMIFLU) 75 MG capsule Take 1 capsule (75 mg total) by mouth 2 (two) times daily.  10 capsule  0   Current Facility-Administered Medications on File Prior to Visit  Medication Dose Route Frequency Provider Last Rate Last Dose  . 0.9 %  sodium chloride infusion  500 mL Intravenous Continuous Iva Boop, MD        Review of Systems:  As per HPI, otherwise negative.    Physical Examination: Filed Vitals:   01/24/13 1307  BP: 155/85  Pulse: 80  Temp: 98.1 F (36.7 C)  Resp: 16   Filed Vitals:   01/24/13 1307  Height: 5\' 5"  (1.651 m)  Weight: 211 lb (95.709 kg)   Body mass index is 35.11 kg/(m^2). Ideal Body Weight: Weight in (lb) to have BMI = 25: 149.9  GEN: WDWN, NAD, Non-toxic, A & O x 3 HEENT: Atraumatic, Normocephalic. Neck supple. No masses, No LAD. Ears and Nose: No external deformity. CV: RRR, No M/G/R. No JVD. No thrill. No extra heart sounds. PULM: CTA, diffuse mild wheezes, no crackles, rhonchi. No retractions. No resp. distress. No accessory muscle use. ABD: S, NT, ND, +BS. No rebound. No HSM. EXTR: No c/c/e NEURO Normal gait.  PSYCH: Normally interactive. Conversant. Not depressed or anxious appearing.  Calm demeanor.    Assessment and Plan: Exacerbation of asthma Neb cxr Improved with neb.  Continue MDI  Carmelina Dane, MD  [facility] reading (PRIMARY) by  Dr. Dareen Piano.  CXR  negative.

## 2013-01-24 NOTE — Telephone Encounter (Signed)
I have spoken to patient to advise she is coming in now.

## 2013-01-24 NOTE — Telephone Encounter (Signed)
I have called patient, left message for her to call me back.

## 2013-01-27 ENCOUNTER — Ambulatory Visit (INDEPENDENT_AMBULATORY_CARE_PROVIDER_SITE_OTHER): Payer: PRIVATE HEALTH INSURANCE | Admitting: Family Medicine

## 2013-01-27 VITALS — BP 160/92 | HR 90 | Temp 98.5°F | Resp 16 | Ht 64.5 in | Wt 208.0 lb

## 2013-01-27 DIAGNOSIS — R062 Wheezing: Secondary | ICD-10-CM

## 2013-01-27 DIAGNOSIS — J45901 Unspecified asthma with (acute) exacerbation: Secondary | ICD-10-CM

## 2013-01-27 DIAGNOSIS — R05 Cough: Secondary | ICD-10-CM

## 2013-01-27 MED ORDER — ALBUTEROL SULFATE (2.5 MG/3ML) 0.083% IN NEBU
2.5000 mg | INHALATION_SOLUTION | Freq: Once | RESPIRATORY_TRACT | Status: AC
  Administered 2013-01-27: 2.5 mg via RESPIRATORY_TRACT

## 2013-01-27 MED ORDER — ALBUTEROL SULFATE HFA 108 (90 BASE) MCG/ACT IN AERS: 2.0000 | INHALATION_SPRAY | Freq: Four times a day (QID) | RESPIRATORY_TRACT | Status: DC | PRN

## 2013-01-27 MED ORDER — PREDNISONE 20 MG PO TABS: ORAL_TABLET | ORAL | Status: DC

## 2013-01-27 MED ORDER — DOXYCYCLINE HYCLATE 100 MG PO TABS: 100.0000 mg | ORAL_TABLET | Freq: Two times a day (BID) | ORAL | Status: DC

## 2013-01-27 NOTE — Progress Notes (Signed)
[facility] [address] [place] [address] [phone]  Date:  01/27/2013   Name:  Nicole Reyes   DOB:  22-Feb-1956   MRN:  [id number]  PCP:  Elvina Sidle, MD    Chief Complaint: Cough and Wheezing   History of Present Illness:  Nicole Reyes is a 57 y.o. very pleasant female patient who presents with the following:  She was here 3 days ago for illness (also here on 12/19/12 with influenza and an eipsode of syncope).  At her visit on 2/11 she was noted to have a persistent cough and some wheezing, but no SOB.  She was treated with an albuterol neb and got better, negative CXR, sent home to continue her albuterol HFA.    Since her visit she continues to have a cough and wheezing, and she is coughing up blood tinged mucus.  Her cough is getting worse.   She has not noted a fever.  She is still wheezing a lot.  She is on qvar, and albuterol as needed.  She actually ran out of her albuterol inhaler earlier today- last used around 1:30 pm.    She has not noted any other cold type symptoms.  No GI symptoms She notes that prior to being ill with the flu she used just her qvar and her asthama symptoms were quiet  She has never had to be hospitalized or intubated due to her asthma  Patient Active Problem List  Diagnosis  . Asthma  . HTN (hypertension)    Past Medical History  Diagnosis Date  . Asthma   . Anxiety   . Hypertension   . Allergy     Past Surgical History  Procedure Laterality Date  . Laparoscopy      REMOVED SCARE TISSUE FROM OVARIES  . Dilation and curettage of uterus    . Colonoscopy&endoscopy  1980's    PT UNSURE PLACE&MD WHO DID EXAMS=NORMAL  . Colonoscopy  06/29/2011    Normal    History  Substance Use Topics  . Smoking status: Never Smoker   . Smokeless tobacco: Never Used  . Alcohol Use: No    Family History  Problem Relation Age of Onset  . Colon cancer Neg Hx   . Alcohol abuse Father   . Hypertension  Mother   . Asthma Mother   . Hypertension Sister   . Hypertension Brother   . Hypertension Sister   . Hypertension Sister   . Hypertension Sister   . Hypertension Brother     No Known Allergies  Medication list has been reviewed and updated.  Current Outpatient Prescriptions on File Prior to Visit  Medication Sig Dispense Refill  . albuterol (PROVENTIL) (2.5 MG/3ML) 0.083% nebulizer solution Take 3 mLs (2.5 mg total) by nebulization every 4 (four) hours as needed for wheezing.  150 mL  1  . albuterol (VENTOLIN HFA) 108 (90 BASE) MCG/ACT inhaler Inhale 2 puffs every 4 or 6 hours as needed  18 g  11  . amLODipine (NORVASC) 10 MG tablet Take 1 tablet (10 mg total) by mouth daily.  30 tablet  11  . aspirin 81 MG tablet Take 81 mg by mouth daily.        . beclomethasone (QVAR) 80 MCG/ACT inhaler Inhale 1 puff into the lungs as needed.  1 Inhaler  12  . ondansetron (ZOFRAN) 8 MG tablet Take 1 tablet (8 mg total) by mouth every 8 (eight) hours as needed for nausea.  15  tablet  0  . fluticasone (FLONASE) 50 MCG/ACT nasal spray Inhale 1 Inhaler into the lungs as needed.      Marland Kitchen HYDROcodone-homatropine (HYCODAN) 5-1.5 MG/5ML syrup       . oseltamivir (TAMIFLU) 75 MG capsule Take 1 capsule (75 mg total) by mouth 2 (two) times daily.  10 capsule  0   Current Facility-Administered Medications on File Prior to Visit  Medication Dose Route Frequency Provider Last Rate Last Dose  . 0.9 %  sodium chloride infusion  500 mL Intravenous Continuous Iva Boop, MD        Review of Systems:  As per HPI- otherwise negative.   Physical Examination: Filed Vitals:   01/27/13 1615  BP: 160/92  Pulse: 105  Temp: 98.5 F (36.9 C)  Resp: 16   Filed Vitals:   01/27/13 1615  Height: 5' 4.5" (1.638 m)  Weight: 208 lb (94.348 kg)   Body mass index is 35.16 kg/(m^2). Ideal Body Weight: Weight in (lb) to have BMI = 25: 147.6  GEN: WDWN, NAD, Non-toxic, A & O x 3, overweight HEENT: Atraumatic,  Normocephalic. Neck supple. No masses, No LAD. Bilateral TM wnl, oropharynx normal.  PEERL,EOMI.   Ears and Nose: No external deformity. CV: RRR, No M/G/R. No JVD. No thrill. No extra heart sounds. PULM: CTA B, -no crackles, rhonchi but she has wheezes throughout. No retractions. No resp. distress. No accessory muscle use. ABD: S, NT, ND, +BS. No rebound. No HSM. EXTR: No c/c/e NEURO Normal gait.  PSYCH: Normally interactive. Conversant. Not depressed or anxious appearing.  Calm demeanor.   Albuterol neb:  O2 sat to 98%, she felt a bit better and wheezing much reduced   Assessment and Plan: Wheezing - Plan: albuterol (PROVENTIL) (2.5 MG/3ML) 0.083% nebulizer solution 2.5 mg, albuterol (PROVENTIL HFA;VENTOLIN HFA) 108 (90 BASE) MCG/ACT inhaler  Asthma with acute exacerbation - Plan: predniSONE (DELTASONE) 20 MG tablet  Cough - Plan: doxycycline (VIBRA-TABS) 100 MG tablet  Treat as above for asthma exacerbation. She notes that since she had the flu her symtoms have been much worse than they were in the past.  Expect that she will get back to her baseline again.  Encouraged her to follow- up her BP once well  Willadene Mounsey, MD

## 2013-01-27 NOTE — Patient Instructions (Addendum)
You are having an exacerbation of your asthma. We are going to treat you with oral steroid (do not take your zofran while you are on the prednisone) and have you increase your qvar to 2 puffs twice a day for a while.  We will also use an antibiotic (doxycycline) for any bronchitis.   If you are not better in 2 days please call me or come back in.  If you develop any severe distress to to the ER.

## 2013-03-26 ENCOUNTER — Other Ambulatory Visit: Payer: Self-pay | Admitting: Physician Assistant

## 2013-03-27 ENCOUNTER — Ambulatory Visit (INDEPENDENT_AMBULATORY_CARE_PROVIDER_SITE_OTHER): Payer: PRIVATE HEALTH INSURANCE | Admitting: Family Medicine

## 2013-03-27 VITALS — BP 154/90 | HR 90 | Temp 98.1°F | Resp 16 | Ht 65.5 in | Wt 210.0 lb

## 2013-03-27 DIAGNOSIS — Z23 Encounter for immunization: Secondary | ICD-10-CM

## 2013-03-27 DIAGNOSIS — J45901 Unspecified asthma with (acute) exacerbation: Secondary | ICD-10-CM

## 2013-03-27 DIAGNOSIS — Z0000 Encounter for general adult medical examination without abnormal findings: Secondary | ICD-10-CM

## 2013-03-27 DIAGNOSIS — L551 Sunburn of second degree: Secondary | ICD-10-CM

## 2013-03-27 DIAGNOSIS — J45909 Unspecified asthma, uncomplicated: Secondary | ICD-10-CM

## 2013-03-27 DIAGNOSIS — I10 Essential (primary) hypertension: Secondary | ICD-10-CM

## 2013-03-27 DIAGNOSIS — R062 Wheezing: Secondary | ICD-10-CM

## 2013-03-27 LAB — COMPREHENSIVE METABOLIC PANEL
ALT: 20 U/L (ref 0–35)
BUN: 13 mg/dL (ref 6–23)
CO2: 26 mEq/L (ref 19–32)
Calcium: 9.7 mg/dL (ref 8.4–10.5)
Chloride: 107 mEq/L (ref 96–112)
Creat: 0.88 mg/dL (ref 0.50–1.10)
Glucose, Bld: 86 mg/dL (ref 70–99)
Total Bilirubin: 0.5 mg/dL (ref 0.3–1.2)

## 2013-03-27 LAB — POCT CBC
HCT, POC: 38.3 % (ref 37.7–47.9)
Hemoglobin: 12 g/dL — AB (ref 12.2–16.2)
Lymph, poc: 2.1 (ref 0.6–3.4)
MCH, POC: 25.6 pg — AB (ref 27–31.2)
MCHC: 31.3 g/dL — AB (ref 31.8–35.4)
MCV: 81.8 fL (ref 80–97)
POC MID %: 5.7 %M (ref 0–12)
RBC: 4.68 M/uL (ref 4.04–5.48)
WBC: 6.9 10*3/uL (ref 4.6–10.2)

## 2013-03-27 LAB — LIPID PANEL
Cholesterol: 211 mg/dL — ABNORMAL HIGH (ref 0–200)
Total CHOL/HDL Ratio: 4.1 Ratio
Triglycerides: 155 mg/dL — ABNORMAL HIGH (ref <150)
VLDL: 31 mg/dL (ref 0–40)

## 2013-03-27 MED ORDER — METHYLPREDNISOLONE SODIUM SUCC 125 MG IJ SOLR
125.0000 mg | Freq: Once | INTRAMUSCULAR | Status: AC
  Administered 2013-03-27: 125 mg via INTRAMUSCULAR

## 2013-03-27 MED ORDER — ALBUTEROL SULFATE HFA 108 (90 BASE) MCG/ACT IN AERS: 2.0000 | INHALATION_SPRAY | Freq: Four times a day (QID) | RESPIRATORY_TRACT | Status: DC | PRN

## 2013-03-27 MED ORDER — AMLODIPINE BESYLATE 10 MG PO TABS: 10.0000 mg | ORAL_TABLET | Freq: Every day | ORAL | Status: DC

## 2013-03-27 MED ORDER — HYDROXYZINE HCL 25 MG PO TABS: 12.5000 mg | ORAL_TABLET | Freq: Three times a day (TID) | ORAL | Status: DC | PRN

## 2013-03-27 MED ORDER — BECLOMETHASONE DIPROPIONATE 80 MCG/ACT IN AERS: 1.0000 | INHALATION_SPRAY | Freq: Two times a day (BID) | RESPIRATORY_TRACT | Status: DC

## 2013-03-27 MED ORDER — PREDNISONE 20 MG PO TABS: ORAL_TABLET | ORAL | Status: DC

## 2013-03-27 NOTE — Progress Notes (Signed)
Subjective:    Patient ID: Nicole Reyes, female    DOB: 1956-04-02, 57 y.o.   MRN: [id number] Chief Complaint  Patient presents with  . Annual Exam   HPI  Paps done every 3 yrs along with yearly mammograms at Dr. Nita Sells office who she sees ever summer.  Has never had an abnormal pap smear or mammogram.  Had completely normal colonoscopy 2012 so repeat 2022.  TDaP done 2 yrs ago but no prev pneumovax recorded - pt does not remember last one.  Has not eating today.  Is having some rash on her neck and feels really itchy.  Was outside on Sat and when she came in that night she noted it was tender with a lot of bumps.  Then it became very hot and the bumps have gotten much worse.  She has tried topical cortisone w/o any relief.  Past Medical History  Diagnosis Date  . Asthma   . Anxiety   . Hypertension   . Allergy    Current Outpatient Prescriptions on File Prior to Visit  Medication Sig Dispense Refill  . albuterol (PROVENTIL) (2.5 MG/3ML) 0.083% nebulizer solution Take 3 mLs (2.5 mg total) by nebulization every 4 (four) hours as needed for wheezing.  150 mL  1  . aspirin 81 MG tablet Take 81 mg by mouth daily.         No current facility-administered medications on file prior to visit.   No Known Allergies  Review of Systems  Constitutional: Negative.   HENT: Negative.   Eyes: Negative.   Respiratory: Positive for cough and wheezing. Negative for shortness of breath.   Cardiovascular: Negative.   Gastrointestinal: Negative.   Endocrine: Negative.   Genitourinary: Negative.   Musculoskeletal: Negative.   Skin: Positive for color change and rash. Negative for pallor and wound.  Allergic/Immunologic: Negative.   Neurological: Negative.   Hematological: Negative.   Psychiatric/Behavioral: Negative.       BP 154/90  Pulse 90  Temp(Src) 98.1 F (36.7 C) (Oral)  Resp 16  Ht 5' 5.5" (1.664 m)  Wt 210 lb (95.255 kg)  BMI 34.4 kg/m2  SpO2 99% Objective:   Physical  Exam  Constitutional: She is oriented to person, place, and time. She appears well-developed and well-nourished. No distress.  HENT:  Head: Normocephalic and atraumatic.  Right Ear: Tympanic membrane, external ear and ear canal normal.  Left Ear: Tympanic membrane, external ear and ear canal normal.  Nose: Nose normal. No mucosal edema or rhinorrhea.  Mouth/Throat: Uvula is midline, oropharynx is clear and moist and mucous membranes are normal. No posterior oropharyngeal erythema.  Eyes: Conjunctivae and EOM are normal. Pupils are equal, round, and reactive to light. Right eye exhibits no discharge. Left eye exhibits no discharge. No scleral icterus.  Neck: Trachea normal and normal range of motion. Neck supple. No thyromegaly present.  Non-blanching warm erythema around entire neck with vesicles ranging from pinpoint to pencil-eraser, mildly tender  Cardiovascular: Normal rate, regular rhythm, normal heart sounds and intact distal pulses.   Pulmonary/Chest: Effort normal and breath sounds normal. No respiratory distress.  Abdominal: Soft. Bowel sounds are normal. There is no tenderness.  Musculoskeletal: She exhibits no edema.  Lymphadenopathy:    She has no cervical adenopathy.  Neurological: She is alert and oriented to person, place, and time. She has normal reflexes.  Skin: Skin is warm and dry. She is not diaphoretic. No erythema.  Psychiatric: She has a normal mood and affect. Her behavior is  normal.      Assessment & Plan:  HTN (hypertension) - Plan: amLODipine (NORVASC) 10 MG tablet refilled x 1 yr.  Asthma - Plan: beclomethasone (QVAR) 80 MCG/ACT inhaler refilled x 1 yr.   Sunburn, blistering - Plan: start topical aloe. Prn hydroxyzine for itching.  Routine general medical examination at a health care facility - Plan: Pneumococcal polysaccharide vaccine 23-valent greater than or equal to 2yo subcutaneous/IM, POCT CBC, Comprehensive metabolic panel, Lipid panel, TSH  Asthma  with acute exacerbation - Plan: predniSONE (DELTASONE) 20 MG tablet, methylPREDNISolone sodium succinate (SOLU-MEDROL) 125 mg/2 mL injection 125 mg, POCT CBC, Comprehensive metabolic panel, Lipid panel, TSH, albuterol (PROVENTIL HFA;VENTOLIN HFA) 108 (90 BASE) MCG/ACT inhaler  Meds ordered this encounter  Medications  . predniSONE (DELTASONE) 20 MG tablet    Sig: Take 2 pills for 3 days, then 1 pill for 3 days    Dispense:  9 tablet    Refill:  0  . beclomethasone (QVAR) 80 MCG/ACT inhaler    Sig: Inhale 1 puff into the lungs 2 (two) times daily.    Dispense:  1 Inhaler    Refill:  12  . amLODipine (NORVASC) 10 MG tablet    Sig: Take 1 tablet (10 mg total) by mouth daily.    Dispense:  30 tablet    Refill:  11  . albuterol (PROVENTIL HFA;VENTOLIN HFA) 108 (90 BASE) MCG/ACT inhaler    Sig: Inhale 2 puffs into the lungs every 6 (six) hours as needed for wheezing.    Dispense:  1 Inhaler    Refill:  11    May dispense pro-air or albuterol inhaler preferred by her insurance plan  . hydrOXYzine (ATARAX/VISTARIL) 25 MG tablet    Sig: Take 0.5-1 tablets (12.5-25 mg total) by mouth every 8 (eight) hours as needed for itching.    Dispense:  30 tablet    Refill:  0  . methylPREDNISolone sodium succinate (SOLU-MEDROL) 125 mg/2 mL injection 125 mg    Sig:

## 2013-06-13 ENCOUNTER — Encounter: Payer: Self-pay | Admitting: Family Medicine

## 2013-06-26 ENCOUNTER — Ambulatory Visit (INDEPENDENT_AMBULATORY_CARE_PROVIDER_SITE_OTHER): Payer: PRIVATE HEALTH INSURANCE | Admitting: Family Medicine

## 2013-06-26 VITALS — BP 130/80 | HR 67 | Temp 98.0°F | Resp 16 | Ht 65.5 in | Wt 202.4 lb

## 2013-06-26 DIAGNOSIS — D649 Anemia, unspecified: Secondary | ICD-10-CM

## 2013-06-26 DIAGNOSIS — R0982 Postnasal drip: Secondary | ICD-10-CM

## 2013-06-26 LAB — POCT CBC
Hemoglobin: 12.9 g/dL (ref 12.2–16.2)
MCH, POC: 25.9 pg — AB (ref 27–31.2)
MCHC: 31.2 g/dL — AB (ref 31.8–35.4)
MID (cbc): 0.3 (ref 0–0.9)
MPV: 9.6 fL (ref 0–99.8)
POC Granulocyte: 5 (ref 2–6.9)
POC MID %: 4.7 %M (ref 0–12)
Platelet Count, POC: 377 10*3/uL (ref 142–424)
RBC: 4.99 M/uL (ref 4.04–5.48)
WBC: 7.4 10*3/uL (ref 4.6–10.2)

## 2013-06-26 MED ORDER — IPRATROPIUM BROMIDE 0.03 % NA SOLN: 2.0000 | Freq: Four times a day (QID) | NASAL | Status: DC

## 2013-06-26 NOTE — Progress Notes (Signed)
[facility] [address] [place] [address] [phone]  Date:  06/26/2013   Name:  Nicole Reyes   DOB:  04-06-1956   MRN:  [id number]  PCP:  Elvina Sidle, MD    Chief Complaint: Follow-up   History of Present Illness:  Nicole Reyes is a 57 y.o. very pleasant female patient who presents with the following:  She came here to recheck her liver function- however this was already done in April of this year.  She has noted some headaches for a couple of weeks, and that she has some slight sinus pain and congestion.  She also notes PND that makes her sick to her stomach.   She has noted some hot flashes, but is not sure about fever.  No diarrhea  She donates blood regularly.  However, twice this summer she has been told that her blood count is too low to donate- that her "iron is low."  She did take some supplemental iron for a little while but has not stopped She last successfully donated in April of this year.   She has no menstrual bleeding- post menopausal.   No other unusual bleeding.    Patient Active Problem List   Diagnosis Date Noted  . Asthma 12/19/2012  . HTN (hypertension) 12/19/2012    Past Medical History  Diagnosis Date  . Asthma   . Anxiety   . Hypertension   . Allergy     Past Surgical History  Procedure Laterality Date  . Laparoscopy      REMOVED SCARE TISSUE FROM OVARIES  . Dilation and curettage of uterus    . Colonoscopy&endoscopy  1980's    PT UNSURE PLACE&MD WHO DID EXAMS=NORMAL  . Colonoscopy  06/29/2011    Normal    History  Substance Use Topics  . Smoking status: Never Smoker   . Smokeless tobacco: Never Used  . Alcohol Use: No    Family History  Problem Relation Age of Onset  . Colon cancer Neg Hx   . Alcohol abuse Father   . Hypertension Mother   . Asthma Mother   . Hypertension Sister   . Hypertension Brother   . Hypertension Sister   . Hypertension Sister   . Hypertension Sister   .  Hypertension Brother     No Known Allergies  Medication list has been reviewed and updated.  Current Outpatient Prescriptions on File Prior to Visit  Medication Sig Dispense Refill  . albuterol (PROVENTIL HFA;VENTOLIN HFA) 108 (90 BASE) MCG/ACT inhaler Inhale 2 puffs into the lungs every 6 (six) hours as needed for wheezing.  1 Inhaler  11  . albuterol (PROVENTIL) (2.5 MG/3ML) 0.083% nebulizer solution Take 3 mLs (2.5 mg total) by nebulization every 4 (four) hours as needed for wheezing.  150 mL  1  . amLODipine (NORVASC) 10 MG tablet Take 1 tablet (10 mg total) by mouth daily.  30 tablet  11  . aspirin 81 MG tablet Take 81 mg by mouth daily.        . beclomethasone (QVAR) 80 MCG/ACT inhaler Inhale 1 puff into the lungs 2 (two) times daily.  1 Inhaler  12  . hydrOXYzine (ATARAX/VISTARIL) 25 MG tablet Take 0.5-1 tablets (12.5-25 mg total) by mouth every 8 (eight) hours as needed for itching.  30 tablet  0  . predniSONE (DELTASONE) 20 MG tablet Take 2 pills for 3 days, then 1 pill for 3 days  9 tablet  0   No current  facility-administered medications on file prior to visit.    Review of Systems:  As per HPI- otherwise negative.   Physical Examination: Filed Vitals:   06/26/13 1231  BP: 130/80  Pulse: 67  Temp: 98 F (36.7 C)  Resp: 16   Filed Vitals:   06/26/13 1231  Height: 5' 5.5" (1.664 m)  Weight: 202 lb 6.4 oz (91.808 kg)   Body mass index is 33.16 kg/(m^2). Ideal Body Weight: Weight in (lb) to have BMI = 25: 152.2  GEN: WDWN, NAD, Non-toxic, A & O x 3, overweight, looks well HEENT: Atraumatic, Normocephalic. Neck supple. No masses, No LAD.  Bilateral TM wnl, oropharynx normal.  PEERL,EOMI.   Nasal cavity with some mucus and congestion, sinuses negative  Ears and Nose: No external deformity. CV: RRR, No M/G/R. No JVD. No thrill. No extra heart sounds. PULM: CTA B, no wheezes, crackles, rhonchi. No retractions. No resp. distress. No accessory muscle use. ABD: S, NT,  ND EXTR: No c/c/e NEURO Normal gait.  PSYCH: Normally interactive. Conversant. Not depressed or anxious appearing.  Calm demeanor.   Results for orders placed in visit on 06/26/13  POCT CBC      Result Value Range   WBC 7.4  4.6 - 10.2 K/uL   Lymph, poc 2.1  0.6 - 3.4   POC LYMPH PERCENT 28.2  10 - 50 %L   MID (cbc) 0.3  0 - 0.9   POC MID % 4.7  0 - 12 %M   POC Granulocyte 5.0  2 - 6.9   Granulocyte percent 67.1  37 - 80 %G   RBC 4.99  4.04 - 5.48 M/uL   Hemoglobin 12.9  12.2 - 16.2 g/dL   HCT, POC 96.0  45.4 - 47.9 %   MCV 83.0  80 - 97 fL   MCH, POC 25.9 (*) 27 - 31.2 pg   MCHC 31.2 (*) 31.8 - 35.4 g/dL   RDW, POC 09.8     Platelet Count, POC 377  142 - 424 K/uL   MPV 9.6  0 - 99.8 fL     Assessment and Plan: Anemia - Plan: POCT CBC  Post-nasal drip - Plan: ipratropium (ATROVENT) 0.03 % nasal spray  No currently anemic. Colonoscopy was 3 years ago and normal.  Reassurance.  atrovent nasal spray for symptomatic PND. Let me know if sx not better  Signed Abbe Amsterdam, MD

## 2013-06-26 NOTE — Patient Instructions (Addendum)
Let me know if the nasal spray does not resolve your nasal symptoms.   Take care!

## 2013-07-20 ENCOUNTER — Encounter: Payer: Self-pay | Admitting: Family Medicine

## 2013-07-20 ENCOUNTER — Ambulatory Visit (INDEPENDENT_AMBULATORY_CARE_PROVIDER_SITE_OTHER): Payer: PRIVATE HEALTH INSURANCE | Admitting: Family Medicine

## 2013-07-20 VITALS — BP 142/84 | HR 81 | Temp 97.9°F | Resp 18 | Ht 65.5 in | Wt 200.4 lb

## 2013-07-20 DIAGNOSIS — N898 Other specified noninflammatory disorders of vagina: Secondary | ICD-10-CM

## 2013-07-20 DIAGNOSIS — L293 Anogenital pruritus, unspecified: Secondary | ICD-10-CM

## 2013-07-20 DIAGNOSIS — B3731 Acute candidiasis of vulva and vagina: Secondary | ICD-10-CM

## 2013-07-20 DIAGNOSIS — B373 Candidiasis of vulva and vagina: Secondary | ICD-10-CM

## 2013-07-20 LAB — POCT WET PREP WITH KOH
KOH Prep POC: NEGATIVE
Trichomonas, UA: NEGATIVE
Yeast Wet Prep HPF POC: NEGATIVE

## 2013-07-20 MED ORDER — METRONIDAZOLE 500 MG PO TABS: 500.0000 mg | ORAL_TABLET | Freq: Two times a day (BID) | ORAL | Status: DC

## 2013-07-20 MED ORDER — NYSTATIN 100000 UNIT/GM EX CREA: TOPICAL_CREAM | Freq: Two times a day (BID) | CUTANEOUS | Status: DC

## 2013-07-20 NOTE — Patient Instructions (Signed)
Bacterial Vaginosis Bacterial vaginosis (BV) is a vaginal infection where the normal balance of bacteria in the vagina is disrupted. The normal balance is then replaced by an overgrowth of certain bacteria. There are several different kinds of bacteria that can cause BV. BV is the most common vaginal infection in women of childbearing age. CAUSES   The cause of BV is not fully understood. BV develops when there is an increase or imbalance of harmful bacteria.  Some activities or behaviors can upset the normal balance of bacteria in the vagina and put women at increased risk including:  Having a new sex partner or multiple sex partners.  Douching.  Using an intrauterine device (IUD) for contraception.  It is not clear what role sexual activity plays in the development of BV. However, women that have never had sexual intercourse are rarely infected with BV. Women do not get BV from toilet seats, bedding, swimming pools or from touching objects around them.  SYMPTOMS   Grey vaginal discharge.  A fish-like odor with discharge, especially after sexual intercourse.  Itching or burning of the vagina and vulva.  Burning or pain with urination.  Some women have no signs or symptoms at all. DIAGNOSIS  Your caregiver must examine the vagina for signs of BV. Your caregiver will perform lab tests and look at the sample of vaginal fluid through a microscope. They will look for bacteria and abnormal cells (clue cells), a pH test higher than 4.5, and a positive amine test all associated with BV.  RISKS AND COMPLICATIONS   Pelvic inflammatory disease (PID).  Infections following gynecology surgery.  Developing HIV.  Developing herpes virus. TREATMENT  Sometimes BV will clear up without treatment. However, all women with symptoms of BV should be treated to avoid complications, especially if gynecology surgery is planned. Female partners generally do not need to be treated. However, BV may spread  between female sex partners so treatment is helpful in preventing a recurrence of BV.   BV may be treated with antibiotics. The antibiotics come in either pill or vaginal cream forms. Either can be used with nonpregnant or pregnant women, but the recommended dosages differ. These antibiotics are not harmful to the baby.  BV can recur after treatment. If this happens, a second round of antibiotics will often be prescribed.  Treatment is important for pregnant women. If not treated, BV can cause a premature delivery, especially for a pregnant woman who had a premature birth in the past. All pregnant women who have symptoms of BV should be checked and treated.  For chronic reoccurrence of BV, treatment with a type of prescribed gel vaginally twice a week is helpful. HOME CARE INSTRUCTIONS   Finish all medication as directed by your caregiver.  Do not have sex until treatment is completed.  Tell your sexual partner that you have a vaginal infection. They should see their caregiver and be treated if they have problems, such as a mild rash or itching.  Practice safe sex. Use condoms. Only have 1 sex partner. PREVENTION  Basic prevention steps can help reduce the risk of upsetting the natural balance of bacteria in the vagina and developing BV:  Do not have sexual intercourse (be abstinent).  Do not douche.  Use all of the medicine prescribed for treatment of BV, even if the signs and symptoms go away.  Tell your sex partner if you have BV. That way, they can be treated, if needed, to prevent reoccurrence. SEEK MEDICAL CARE IF:     Your symptoms are not improving after 3 days of treatment.  You have increased discharge, pain, or fever. MAKE SURE YOU:   Understand these instructions.  Will watch your condition.  Will get help right away if you are not doing well or get worse. FOR MORE INFORMATION  Division of STD Prevention (DSTDP), Centers for Disease Control and Prevention:  www.cdc.gov/std American [employer] Association (ASHA): www.ashastd.org  Document Released: 11/30/2005 Document Revised: 02/22/2012 Document Reviewed: 05/23/2009 [employer] Patient Information 2014 [employer].  

## 2013-07-20 NOTE — Progress Notes (Signed)
[facility]:  Office Visit  Chief Complaint:  Chief Complaint  Patient presents with  . Rash    x 2 days     HPI: Nicole Reyes is a 57 y.o. female who complains of  2-3 day history of vaginal itching  Tried monistat cream without releif More swelling and pain, itching and burning Only on outside, does work out When she cahnges her clothes she is bleeding  She has been scratching it with her nails and has been having some bleeding on her panties   Past Medical History  Diagnosis Date  . Asthma   . Anxiety   . Hypertension   . Allergy    Past Surgical History  Procedure Laterality Date  . Laparoscopy      REMOVED SCARE TISSUE FROM OVARIES  . Dilation and curettage of uterus    . Colonoscopy&endoscopy  1980's    PT UNSURE PLACE&MD WHO DID EXAMS=NORMAL  . Colonoscopy  06/29/2011    Normal   History   Social History  . Marital Status: Single    Spouse Name: N/A    Number of Children: N/A  . Years of Education: N/A   Social History Main Topics  . Smoking status: Never Smoker   . Smokeless tobacco: Never Used  . Alcohol Use: No  . Drug Use: No  . Sexually Active: Yes   Other Topics Concern  . None   Social History Narrative  . None   Family History  Problem Relation Age of Onset  . Colon cancer Neg Hx   . Alcohol abuse Father   . Hypertension Mother   . Asthma Mother   . Hypertension Sister   . Hypertension Brother   . Hypertension Sister   . Hypertension Sister   . Hypertension Sister   . Hypertension Brother    No Known Allergies Prior to Admission medications   Medication Sig Start Date End Date Taking? Authorizing Provider  albuterol (PROVENTIL HFA;VENTOLIN HFA) 108 (90 BASE) MCG/ACT inhaler Inhale 2 puffs into the lungs every 6 (six) hours as needed for wheezing. 03/27/13  Yes Sherren Mocha, MD  albuterol (PROVENTIL) (2.5 MG/3ML) 0.083% nebulizer solution Take 3 mLs (2.5 mg total) by nebulization every 4 (four) hours as  needed for wheezing. 01/24/13  Yes Phillips Odor, MD  amLODipine (NORVASC) 10 MG tablet Take 1 tablet (10 mg total) by mouth daily. 03/27/13  Yes Sherren Mocha, MD  aspirin 81 MG tablet Take 81 mg by mouth daily.     Yes Historical Provider, MD  beclomethasone (QVAR) 80 MCG/ACT inhaler Inhale 1 puff into the lungs 2 (two) times daily. 03/27/13 03/27/14 Yes Sherren Mocha, MD  hydrOXYzine (ATARAX/VISTARIL) 25 MG tablet Take 0.5-1 tablets (12.5-25 mg total) by mouth every 8 (eight) hours as needed for itching. 03/27/13  Yes Sherren Mocha, MD  ipratropium (ATROVENT) 0.03 % nasal spray Place 2 sprays into the nose 4 (four) times daily. 06/26/13  Yes Gwenlyn Found Copland, MD     ROS: The patient denies fevers, chills, night sweats, unintentional weight loss, chest pain, palpitations, wheezing, dyspnea on exertion, nausea, vomiting, abdominal pain, dysuria, hematuria, melena, numbness, weakness, or tingling.   All other systems have been reviewed and were otherwise negative with the exception of those mentioned in the HPI and as above.    PHYSICAL EXAM: Filed Vitals:   07/20/13 0907  BP: 142/84  Pulse: 81  Temp: 97.9 F (36.6 C)  Resp: 18  Filed Vitals:   07/20/13 0907  Height: 5' 5.5" (1.664 m)  Weight: 200 lb 6.4 oz (90.901 kg)   Body mass index is 32.83 kg/(m^2).  General: Alert, no acute distress HEENT:  Normocephalic, atraumatic, oropharynx patent.  Cardiovascular:  Regular rate and rhythm, no rubs murmurs or gallops.  No Carotid bruits, radial pulse intact. No pedal edema.  Respiratory: Clear to auscultation bilaterally.  No wheezes, rales, or rhonchi.  No cyanosis, no use of accessory musculature GI: No organomegaly, abdomen is soft and non-tender, positive bowel sounds.  No masses. Skin: No rashes. Neurologic: Facial musculature symmetric. Psychiatric: Patient is appropriate throughout our interaction. Lymphatic: No cervical lymphadenopathy Musculoskeletal: Gait intact. + labial swelling  and erythema and excoriated areas. Tender. No HSV lesions CMT normal Cervix nl, no masses, no CMT + thin white dc  LABS: Results for orders placed in visit on 07/20/13  POCT WET PREP WITH KOH      Result Value Range   Trichomonas, UA Negative     Clue Cells Wet Prep HPF POC 6-8     Epithelial Wet Prep HPF POC 10-15     Yeast Wet Prep HPF POC neg     Bacteria Wet Prep HPF POC 2+     RBC Wet Prep HPF POC 1-5     WBC Wet Prep HPF POC 12-18     KOH Prep POC Negative       EKG/XRAY:   Primary read interpreted by Dr. Conley Rolls at [facility].    ASSESSMENT/PLAN: Encounter Diagnoses  Name Primary?  . Vaginal itching Yes  . Candida vaginitis    Rx Flagyl 500 mg BID x 7 days Rx Nystatin cream F/u rn    Kazue Cerro PHUONG, DO 07/20/2013 10:05 AM

## 2013-07-26 ENCOUNTER — Ambulatory Visit (INDEPENDENT_AMBULATORY_CARE_PROVIDER_SITE_OTHER): Payer: PRIVATE HEALTH INSURANCE | Admitting: Family Medicine

## 2013-07-26 VITALS — BP 152/95 | HR 86 | Temp 98.3°F | Resp 18 | Ht 65.5 in | Wt 203.0 lb

## 2013-07-26 DIAGNOSIS — N898 Other specified noninflammatory disorders of vagina: Secondary | ICD-10-CM

## 2013-07-26 DIAGNOSIS — L293 Anogenital pruritus, unspecified: Secondary | ICD-10-CM

## 2013-07-26 LAB — POCT WET PREP WITH KOH: Yeast Wet Prep HPF POC: NEGATIVE

## 2013-07-26 MED ORDER — PREDNISONE 20 MG PO TABS: ORAL_TABLET | ORAL | Status: DC

## 2013-07-26 NOTE — Patient Instructions (Addendum)
Use the oral prednisone.  Do not place anything on your rash, and try to keep the area dry and cool. Treat the skin very gently.  Please come and see me tomorrow for a recheck    Please fast track for a recheck 07/27/13

## 2013-07-26 NOTE — Progress Notes (Signed)
[facility] [address], [place] [address] [phone]  Date:  07/26/2013   Name:  Nicole Reyes   DOB:  06-29-1956   MRN:  [id number]  PCP:  Nicole Sidle, MD    Chief Complaint: Follow-up   History of Present Illness:  Nicole Reyes is a 57 y.o. very pleasant female patient who presents with the following:  Here to follow-up vaginal itching today.  She was here about one week ago and was treated with flagyl and nystatin cream.  She thought she was starting to feel better, but noted that her underwear seemed to be very wet and that the area is weeping.  She feels swollen, itchy, and miserable.  She also has some areas of dermatitis on her shoulders bilaterally No SOB, CP, oral lesions or lip/ tongue swelling.  She does not heave dysuria, but the urine hurts her skin.   She does have some brownish vaginal discharge.   She has not taken her BP medication yet today She does not regularly use her albuterol.  She has used a couple of different kinds of vaginal creams to relieve her sx.  She has not been camping, used the bathroom in the woods or otherwise been exposed to any allergens that she is aware of    Patient Active Problem List   Diagnosis Date Noted  . Asthma 12/19/2012  . HTN (hypertension) 12/19/2012    Past Medical History  Diagnosis Date  . Asthma   . Anxiety   . Hypertension   . Allergy     Past Surgical History  Procedure Laterality Date  . Laparoscopy      REMOVED SCARE TISSUE FROM OVARIES  . Dilation and curettage of uterus    . Colonoscopy&endoscopy  1980's    PT UNSURE PLACE&MD WHO DID EXAMS=NORMAL  . Colonoscopy  06/29/2011    Normal    History  Substance Use Topics  . Smoking status: Never Smoker   . Smokeless tobacco: Never Used  . Alcohol Use: No    Family History  Problem Relation Age of Onset  . Colon cancer Neg Hx   . Alcohol abuse Father   . Hypertension Mother   . Asthma Mother   .  Hypertension Sister   . Hypertension Brother   . Hypertension Sister   . Hypertension Sister   . Hypertension Sister   . Hypertension Brother     No Known Allergies  Medication list has been reviewed and updated.  Current Outpatient Prescriptions on File Prior to Visit  Medication Sig Dispense Refill  . albuterol (PROVENTIL HFA;VENTOLIN HFA) 108 (90 BASE) MCG/ACT inhaler Inhale 2 puffs into the lungs every 6 (six) hours as needed for wheezing.  1 Inhaler  11  . albuterol (PROVENTIL) (2.5 MG/3ML) 0.083% nebulizer solution Take 3 mLs (2.5 mg total) by nebulization every 4 (four) hours as needed for wheezing.  150 mL  1  . amLODipine (NORVASC) 10 MG tablet Take 1 tablet (10 mg total) by mouth daily.  30 tablet  11  . aspirin 81 MG tablet Take 81 mg by mouth daily.        . beclomethasone (QVAR) 80 MCG/ACT inhaler Inhale 1 puff into the lungs 2 (two) times daily.  1 Inhaler  12  . ipratropium (ATROVENT) 0.03 % nasal spray Place 2 sprays into the nose 4 (four) times daily.  30 mL  6  . metroNIDAZOLE (FLAGYL) 500 MG tablet Take 1 tablet (500 mg total) by  mouth 2 (two) times daily.  14 tablet  0  . nystatin cream (MYCOSTATIN) Apply topically 2 (two) times daily.  30 g  0  . hydrOXYzine (ATARAX/VISTARIL) 25 MG tablet Take 0.5-1 tablets (12.5-25 mg total) by mouth every 8 (eight) hours as needed for itching.  30 tablet  0   No current facility-administered medications on file prior to visit.    Review of Systems:  As per HPI- otherwise negative.   Physical Examination: Filed Vitals:   07/26/13 1704  BP: 162/88  Pulse: 86  Temp: 98.3 F (36.8 C)  Resp: 18   Filed Vitals:   07/26/13 1704  Height: 5' 5.5" (1.664 m)  Weight: 203 lb (92.08 kg)   Body mass index is 33.26 kg/(m^2). Ideal Body Weight: Weight in (lb) to have BMI = 25: 152.2  GEN: WDWN, NAD, Non-toxic, A & O x 3, overweight HEENT: Atraumatic, Normocephalic. Neck supple. No masses, No LAD.  Bilateral TM wnl, oropharynx  normal.  PEERL,EOMI.   Ears and Nose: No external deformity. CV: RRR, No M/G/R. No JVD. No thrill. No extra heart sounds. PULM: CTA B, no wheezes, crackles, rhonchi. No retractions. No resp. distress. No accessory muscle use. ABD: S, NT, ND. No rebound. No HSM. EXTR: No c/c/e NEURO Normal gait.  PSYCH: Normally interactive. Conversant. Not depressed or anxious appearing.  Calm demeanor.  Skin: she has an irritated, vesicular rash on her right shoulder, less so on the left.  It appears consistent with a contact or rhus dermatitis.  GU: no lesions or vesicles. She has a diffuse, allergic appearing dermatitis over her labia, inguinal folds and mons pubis.  The skin is slightly weepy.   Vaginal canal is normal, no discharge, no CMT, no adnexal masses or tenderness.    Results for orders placed in visit on 07/26/13  POCT WET PREP WITH KOH      Result Value Range   Trichomonas, UA Negative     Clue Cells Wet Prep HPF POC 0-3     Epithelial Wet Prep HPF POC 6-8     Yeast Wet Prep HPF POC neg     Bacteria Wet Prep HPF POC trace     RBC Wet Prep HPF POC neg     WBC Wet Prep HPF POC 8-12     KOH Prep POC Negative    GLUCOSE, POCT (MANUAL RESULT ENTRY)      Result Value Range   POC Glucose 99  70 - 99 mg/dl    Assessment and Plan: Vaginal itching - Plan: POCT Wet Prep with KOH, GC/Chlamydia Probe Amp, POCT glucose (manual entry), predniSONE (DELTASONE) 20 MG tablet  Nicole Reyes is here today with severe groin and labia irritation.  Suspect may be due to one of her topical treatments.  Plan to have her stop all topical treatments, and keep the area cool and dry.  Will use oral prednisone, and benadryl as needed.  She will recheck tomorrow.    Signed Abbe Amsterdam, MD

## 2013-07-27 ENCOUNTER — Ambulatory Visit (INDEPENDENT_AMBULATORY_CARE_PROVIDER_SITE_OTHER): Payer: PRIVATE HEALTH INSURANCE | Admitting: Family Medicine

## 2013-07-27 VITALS — BP 126/76 | HR 70 | Temp 97.9°F | Resp 18 | Ht 65.5 in | Wt 203.0 lb

## 2013-07-27 DIAGNOSIS — N898 Other specified noninflammatory disorders of vagina: Secondary | ICD-10-CM

## 2013-07-27 DIAGNOSIS — L293 Anogenital pruritus, unspecified: Secondary | ICD-10-CM

## 2013-07-27 LAB — GC/CHLAMYDIA PROBE AMP: GC Probe RNA: NEGATIVE

## 2013-07-27 NOTE — Progress Notes (Signed)
[facility] [address], [place] [address] [phone]  Date:  07/27/2013   Name:  Nicole Reyes   DOB:  Apr 25, 1956   MRN:  [id number]  PCP:  Elvina Sidle, MD    Chief Complaint: Follow-up   History of Present Illness:  Eleasha Cataldo is a 57 y.o. very pleasant female patient who presents with the following:  Here to recheck a groin dermatitis- see OV from yesterday. She has stopped all topicals and is taking prednisone.  She feels much better- her itching is nearly gone and she was able to sleep last night.  Overall she is much better.     Patient Active Problem List   Diagnosis Date Noted  . Asthma 12/19/2012  . HTN (hypertension) 12/19/2012    Past Medical History  Diagnosis Date  . Asthma   . Anxiety   . Hypertension   . Allergy     Past Surgical History  Procedure Laterality Date  . Laparoscopy      REMOVED SCARE TISSUE FROM OVARIES  . Dilation and curettage of uterus    . Colonoscopy&endoscopy  1980's    PT UNSURE PLACE&MD WHO DID EXAMS=NORMAL  . Colonoscopy  06/29/2011    Normal    History  Substance Use Topics  . Smoking status: Never Smoker   . Smokeless tobacco: Never Used  . Alcohol Use: No    Family History  Problem Relation Age of Onset  . Colon cancer Neg Hx   . Alcohol abuse Father   . Hypertension Mother   . Asthma Mother   . Hypertension Sister   . Hypertension Brother   . Hypertension Sister   . Hypertension Sister   . Hypertension Sister   . Hypertension Brother     No Known Allergies  Medication list has been reviewed and updated.  Current Outpatient Prescriptions on File Prior to Visit  Medication Sig Dispense Refill  . albuterol (PROVENTIL HFA;VENTOLIN HFA) 108 (90 BASE) MCG/ACT inhaler Inhale 2 puffs into the lungs every 6 (six) hours as needed for wheezing.  1 Inhaler  11  . albuterol (PROVENTIL) (2.5 MG/3ML) 0.083% nebulizer solution Take 3 mLs (2.5 mg total) by nebulization every 4  (four) hours as needed for wheezing.  150 mL  1  . amLODipine (NORVASC) 10 MG tablet Take 1 tablet (10 mg total) by mouth daily.  30 tablet  11  . aspirin 81 MG tablet Take 81 mg by mouth daily.        . beclomethasone (QVAR) 80 MCG/ACT inhaler Inhale 1 puff into the lungs 2 (two) times daily.  1 Inhaler  12  . hydrOXYzine (ATARAX/VISTARIL) 25 MG tablet Take 0.5-1 tablets (12.5-25 mg total) by mouth every 8 (eight) hours as needed for itching.  30 tablet  0  . ipratropium (ATROVENT) 0.03 % nasal spray Place 2 sprays into the nose 4 (four) times daily.  30 mL  6  . predniSONE (DELTASONE) 20 MG tablet Take 2 pills a day for 5 days, then 1 pill a day for 5 days  15 tablet  0   No current facility-administered medications on file prior to visit.    Review of Systems:  As per HPI- otherwise negative.   Physical Examination: Filed Vitals:   07/27/13 1622  BP: 126/76  Pulse: 101  Temp: 97.9 F (36.6 C)  Resp: 18   Filed Vitals:   07/27/13 1622  Height: 5' 5.5" (1.664 m)  Weight: 203 lb (92.08 kg)  Body mass index is 33.26 kg/(m^2). Ideal Body Weight: Weight in (lb) to have BMI = 25: 152.2  GEN: WDWN, NAD, Non-toxic, A & O x 3 HEENT: Atraumatic, Normocephalic. Neck supple. No masses, No LAD. Ears and Nose: No external deformity. CV: RRR, No M/G/R. No JVD. No thrill. No extra heart sounds. PULM: CTA B, no wheezes, crackles, rhonchi. No retractions. No resp. distress. No accessory muscle use. ABD: S, NT, ND, +BS. No rebound. No HSM. EXTR: No c/c/e NEURO Normal gait.  PSYCH: Normally interactive. Conversant. Not depressed or anxious appearing.  Calm demeanor.  Gu: external vaginal swelling, irritation and weeping is much improved.   Assessment and Plan: Vaginal itching  Much improved.  Continue to avoid all topical medications and finish prednisone.  Let me know if any problems   Signed Abbe Amsterdam, MD

## 2013-08-09 ENCOUNTER — Ambulatory Visit (INDEPENDENT_AMBULATORY_CARE_PROVIDER_SITE_OTHER): Payer: PRIVATE HEALTH INSURANCE | Admitting: Family Medicine

## 2013-08-09 VITALS — BP 130/80 | HR 86 | Temp 97.7°F | Resp 18 | Ht 65.0 in | Wt 200.2 lb

## 2013-08-09 DIAGNOSIS — L738 Other specified follicular disorders: Secondary | ICD-10-CM

## 2013-08-09 DIAGNOSIS — L739 Follicular disorder, unspecified: Secondary | ICD-10-CM

## 2013-08-09 MED ORDER — DOXYCYCLINE HYCLATE 100 MG PO TABS: 100.0000 mg | ORAL_TABLET | Freq: Two times a day (BID) | ORAL | Status: DC

## 2013-08-09 NOTE — Patient Instructions (Signed)
We are going to treat you for folliculitis with an antibiotic.  If you are not better in the next few days please let me know- Sooner if worse.   I will perform a culture of one of the wounds and let you know what it says

## 2013-08-09 NOTE — Progress Notes (Addendum)
[facility] [address] [place] [address] [phone]  Date:  08/09/2013   Name:  Nicole Reyes   DOB:  02-May-1956   MRN:  [id number]  PCP:  Elvina Sidle, MD    Chief Complaint: Rash   History of Present Illness:  Nicole Reyes is a 57 y.o. very pleasant female patient who presents with the following:  Seen here about 10 days ago for a severe dermatitis of her groin area.  She responded well to prednisone.  Hjowever, about 4 days ago she noted onset of itchy bumps over her groin and especially on her buttocks.  She otherwise has felt well, no fever or chills.  No vaginal discharge, she is not SA and has not been in a long time.  Does not feel there is any risk of STI  Patient Active Problem List   Diagnosis Date Noted  . Asthma 12/19/2012  . HTN (hypertension) 12/19/2012    Past Medical History  Diagnosis Date  . Asthma   . Anxiety   . Hypertension   . Allergy     Past Surgical History  Procedure Laterality Date  . Laparoscopy      REMOVED SCARE TISSUE FROM OVARIES  . Dilation and curettage of uterus    . Colonoscopy&endoscopy  1980's    PT UNSURE PLACE&MD WHO DID EXAMS=NORMAL  . Colonoscopy  06/29/2011    Normal    History  Substance Use Topics  . Smoking status: Never Smoker   . Smokeless tobacco: Never Used  . Alcohol Use: No    Family History  Problem Relation Age of Onset  . Colon cancer Neg Hx   . Alcohol abuse Father   . Hypertension Mother   . Asthma Mother   . Hypertension Sister   . Hypertension Brother   . Hypertension Sister   . Hypertension Sister   . Hypertension Sister   . Hypertension Brother     No Known Allergies  Medication list has been reviewed and updated.  Current Outpatient Prescriptions on File Prior to Visit  Medication Sig Dispense Refill  . albuterol (PROVENTIL HFA;VENTOLIN HFA) 108 (90 BASE) MCG/ACT inhaler Inhale 2 puffs into the lungs every 6 (six) hours as needed for  wheezing.  1 Inhaler  11  . albuterol (PROVENTIL) (2.5 MG/3ML) 0.083% nebulizer solution Take 3 mLs (2.5 mg total) by nebulization every 4 (four) hours as needed for wheezing.  150 mL  1  . amLODipine (NORVASC) 10 MG tablet Take 1 tablet (10 mg total) by mouth daily.  30 tablet  11  . aspirin 81 MG tablet Take 81 mg by mouth daily.        . beclomethasone (QVAR) 80 MCG/ACT inhaler Inhale 1 puff into the lungs 2 (two) times daily.  1 Inhaler  12  . ipratropium (ATROVENT) 0.03 % nasal spray Place 2 sprays into the nose 4 (four) times daily.  30 mL  6  . hydrOXYzine (ATARAX/VISTARIL) 25 MG tablet Take 0.5-1 tablets (12.5-25 mg total) by mouth every 8 (eight) hours as needed for itching.  30 tablet  0  . predniSONE (DELTASONE) 20 MG tablet Take 2 pills a day for 5 days, then 1 pill a day for 5 days  15 tablet  0   No current facility-administered medications on file prior to visit.    Review of Systems:  As per HPI- otherwise negative.   Physical Examination: Filed Vitals:   08/09/13 1612  BP: 130/80  Pulse:  86  Temp: 97.7 F (36.5 C)  Resp: 18   Filed Vitals:   08/09/13 1612  Height: 5\' 5"  (1.651 m)  Weight: 200 lb 3.2 oz (90.81 kg)   Body mass index is 33.31 kg/(m^2). Ideal Body Weight: Weight in (lb) to have BMI = 25: 149.9  GEN: WDWN, NAD, Non-toxic, A & O x 3, overweight, looks well HEENT: Atraumatic, Normocephalic. Neck supple. No masses, No LAD. Ears and Nose: No external deformity. CV: RRR, No M/G/R. No JVD. No thrill. No extra heart sounds. PULM: CTA B, no wheezes, crackles, rhonchi. No retractions. No resp. distress. No accessory muscle use. ABD: S, NT, ND. No rebound. No HSM. EXTR: No c/c/e NEURO Normal gait.  PSYCH: Normally interactive. Conversant. Not depressed or anxious appearing.  Calm demeanor.  She has a follicular rash with pustules in various stages of healing over her inner thighs and buttocks bilaterally.  No ulcerated lesions to suggest HSV.   Prepped  one pustule with alcohol, unroofed with scalpel and collected culture of pus  Assessment and Plan: Folliculitis - Plan: doxycycline (VIBRA-TABS) 100 MG tablet, Wound culture  Folliculitis, perhaps secondary to disruption of epidermis from her recent severe dermatitis.  Treat with doxy while we await culture.  If worse or not better please let me know  Signed Abbe Amsterdam, MD  08/16/13- called to check on her and LMOM.  Culture grew MSSA.  Doxycycline should clear this up.  Let me know if not better.

## 2013-08-12 LAB — WOUND CULTURE
Gram Stain: NONE SEEN
Gram Stain: NONE SEEN

## 2013-11-28 ENCOUNTER — Other Ambulatory Visit: Payer: Self-pay | Admitting: Physician Assistant

## 2013-11-29 NOTE — Telephone Encounter (Signed)
Dr Patsy Lager, you seem to be pt's main provider now. Pt was originally Rxd Triam cream by Dr Perrin Maltese in 2012 for eczema, but I don't see any recent discussions of this recently although she has been treated by you for skin conditions recently. Do you want to RF or pt RTC?

## 2014-02-10 ENCOUNTER — Ambulatory Visit (INDEPENDENT_AMBULATORY_CARE_PROVIDER_SITE_OTHER): Payer: 59 | Admitting: Family Medicine

## 2014-02-10 VITALS — BP 132/78 | HR 83 | Temp 97.8°F | Resp 16 | Ht 65.5 in | Wt 202.8 lb

## 2014-02-10 DIAGNOSIS — E7800 Pure hypercholesterolemia, unspecified: Secondary | ICD-10-CM | POA: Insufficient documentation

## 2014-02-10 DIAGNOSIS — J45909 Unspecified asthma, uncomplicated: Secondary | ICD-10-CM

## 2014-02-10 DIAGNOSIS — Z131 Encounter for screening for diabetes mellitus: Secondary | ICD-10-CM

## 2014-02-10 DIAGNOSIS — I10 Essential (primary) hypertension: Secondary | ICD-10-CM

## 2014-02-10 LAB — POCT GLYCOSYLATED HEMOGLOBIN (HGB A1C): HEMOGLOBIN A1C: 4.7

## 2014-02-10 LAB — GLUCOSE, POCT (MANUAL RESULT ENTRY): POC Glucose: 77 mg/dl (ref 70–99)

## 2014-02-10 NOTE — Progress Notes (Signed)
Subjective:    Patient ID: Nicole Reyes, female    DOB: 30-Oct-1956, 58 y.o.   MRN: [id number]  HPI This chart was scribed for Wardell Honour, MD by Thea Alken, ED Scribe. This patient was seen in room 12 and the patient's care was started at 3:15 PM.  HPI Comments: Nicole Reyes is a 58 y.o. female who presents to the [facility] requesting blood work. Pt does not smoke or drink. She denies using tobacco. Pt reports that she has been going to the gym for exercise. Pt is 8lbs down from her last physical.  Pt has h/o allergy, asthma, HTN. Pt uses albuterol in the morning and afternoon. Pt uses qvar twice a day. Pt has received flu shot this year.   Pt reports that she is single with no children and that she lives alone. Pt works for [employer] and has been there for 26 years. Pt reports her mother has HTN and asthma. Pt father passed away at age 24-70 from MI. Pt reports that his stroke was at age 72/69 and MI was at 61. She reports that her father had multiples strokes. Pt reports her sisters and brothers have HTN. Pt reports a sister with asthma.   Pt denies SOB, chest pain, and leg swelling. She reports knee pain but states that she has knee problems and that she has had knee surgery in the past.    Pt last physical was last 03/27/13 with Dr. Brigitte Pulse.    Past Medical History  Diagnosis Date  . Asthma   . Anxiety   . Hypertension   . Allergy    No Known Allergies Prior to Admission medications   Medication Sig Start Date End Date Taking? Authorizing Provider  albuterol (PROVENTIL HFA;VENTOLIN HFA) 108 (90 BASE) MCG/ACT inhaler Inhale 2 puffs into the lungs every 6 (six) hours as needed for wheezing. 03/27/13  Yes Shawnee Knapp, MD  albuterol (PROVENTIL) (2.5 MG/3ML) 0.083% nebulizer solution Take 3 mLs (2.5 mg total) by nebulization every 4 (four) hours as needed for wheezing. 01/24/13  Yes Ellison Carwin, MD  amLODipine (NORVASC) 10 MG tablet Take 1 tablet  (10 mg total) by mouth daily. 03/27/13  Yes Shawnee Knapp, MD  aspirin 81 MG tablet Take 81 mg by mouth daily.     Yes Historical Provider, MD  beclomethasone (QVAR) 80 MCG/ACT inhaler Inhale 1 puff into the lungs 2 (two) times daily. 03/27/13 03/27/14 Yes Shawnee Knapp, MD  hydrOXYzine (ATARAX/VISTARIL) 25 MG tablet Take 0.5-1 tablets (12.5-25 mg total) by mouth every 8 (eight) hours as needed for itching. 03/27/13  Yes Shawnee Knapp, MD  ipratropium (ATROVENT) 0.03 % nasal spray Place 2 sprays into the nose 4 (four) times daily. 06/26/13  Yes Jessica C Copland, MD  triamcinolone cream (KENALOG) 0.1 % APPLY TO AFFECTED AREA TWICE A DAY. (NOT TO FACE, GENITALS, OR AXILLES) 11/28/13  Yes Gay Filler Copland, MD  doxycycline (VIBRA-TABS) 100 MG tablet Take 1 tablet (100 mg total) by mouth 2 (two) times daily. 08/09/13   Darreld Mclean, MD    Review of Systems  Constitutional: Negative for fever and chills.  Respiratory: Negative for chest tightness and shortness of breath.   Cardiovascular: Negative for chest pain and leg swelling.  Genitourinary: Negative for menstrual problem.  Musculoskeletal: Negative for myalgias.  All other systems reviewed and are negative.       Objective:   Physical Exam  Nursing note and vitals  reviewed. Constitutional: She is oriented to person, place, and time. She appears well-developed and well-nourished. No distress.  HENT:  Head: Normocephalic and atraumatic.  Right Ear: Hearing, tympanic membrane, external ear and ear canal normal.  Left Ear: Hearing, tympanic membrane, external ear and ear canal normal.  Mouth/Throat: Oropharynx is clear and moist.  Eyes: EOM are normal.  Neck: Neck supple. Carotid bruit is not present. No mass and no thyromegaly present.  Cardiovascular: Normal rate.   Pulmonary/Chest: Effort normal.  Musculoskeletal: Normal range of motion.  Lymphadenopathy:    She has no cervical adenopathy.  Neurological: She is alert and oriented to person,  place, and time.  Skin: Skin is warm and dry.  Psychiatric: She has a normal mood and affect. Her behavior is normal.   Filed Vitals:   02/10/14 1501  BP: 132/78  Pulse: 83  Temp: 97.8 F (36.6 C)  Resp: 16    Results for orders placed in visit on 02/10/14  POCT GLYCOSYLATED HEMOGLOBIN (HGB A1C)      Result Value Ref Range   Hemoglobin A1C 4.7    GLUCOSE, POCT (MANUAL RESULT ENTRY)      Result Value Ref Range   POC Glucose 77  70 - 99 mg/dl       Assessment & Plan:    1. HTN (hypertension)   2. Asthma   3. Pure hypercholesterolemia   4. Screening for diabetes mellitus    1. HTN: controlled; no change in management.   2.  Asthma: moderately controlled with QVAR, Albuterol. S/p flu vaccine at [facility]. 3.  Hypercholesterolemia: stable; weight loss in past year.  Repeat labs. 4.  Screening DM:  Normal glucose and HgbA1c.  Form completed for work.   I personally performed the services described in this documentation, which was scribed in my presence.  The recorded information has been reviewed and is accurate.  Reginia Forts, M.D.  [facility] [address] [place], [place]  [address] [phone] phone [phone] fax

## 2014-02-11 LAB — LIPID PANEL
CHOL/HDL RATIO: 3.7 ratio
CHOLESTEROL: 221 mg/dL — AB (ref 0–200)
HDL: 59 mg/dL (ref >39)
LDL Cholesterol: 151 mg/dL — ABNORMAL HIGH (ref 0–99)
TRIGLYCERIDES: 54 mg/dL (ref <150)
VLDL: 11 mg/dL (ref 0–40)

## 2014-02-12 ENCOUNTER — Telehealth: Payer: Self-pay | Admitting: Family Medicine

## 2014-02-12 NOTE — Telephone Encounter (Signed)
Called and left vm for patient to give [place] a call back to schedule CPE with Dr. Lorelei Pont in 3-6 months

## 2014-02-15 ENCOUNTER — Encounter: Payer: Self-pay | Admitting: *Deleted

## 2014-04-09 ENCOUNTER — Encounter: Payer: Self-pay | Admitting: Family Medicine

## 2014-04-09 ENCOUNTER — Ambulatory Visit (INDEPENDENT_AMBULATORY_CARE_PROVIDER_SITE_OTHER): Payer: PRIVATE HEALTH INSURANCE | Admitting: Family Medicine

## 2014-04-09 VITALS — BP 134/88 | HR 79 | Temp 98.1°F | Resp 16 | Ht 65.5 in | Wt 201.0 lb

## 2014-04-09 DIAGNOSIS — R062 Wheezing: Secondary | ICD-10-CM

## 2014-04-09 DIAGNOSIS — I10 Essential (primary) hypertension: Secondary | ICD-10-CM

## 2014-04-09 DIAGNOSIS — Z0000 Encounter for general adult medical examination without abnormal findings: Secondary | ICD-10-CM

## 2014-04-09 DIAGNOSIS — J309 Allergic rhinitis, unspecified: Secondary | ICD-10-CM

## 2014-04-09 DIAGNOSIS — J45909 Unspecified asthma, uncomplicated: Secondary | ICD-10-CM

## 2014-04-09 LAB — CBC
HEMATOCRIT: 39.4 % (ref 36.0–46.0)
Hemoglobin: 13.5 g/dL (ref 12.0–15.0)
MCH: 26.2 pg (ref 26.0–34.0)
MCHC: 34.3 g/dL (ref 30.0–36.0)
MCV: 76.5 fL — ABNORMAL LOW (ref 78.0–100.0)
Platelets: 345 10*3/uL (ref 150–400)
RBC: 5.15 MIL/uL — ABNORMAL HIGH (ref 3.87–5.11)
RDW: 13.4 % (ref 11.5–15.5)
WBC: 7.2 10*3/uL (ref 4.0–10.5)

## 2014-04-09 LAB — COMPREHENSIVE METABOLIC PANEL
ALBUMIN: 4.5 g/dL (ref 3.5–5.2)
ALT: 22 U/L (ref 0–35)
AST: 32 U/L (ref 0–37)
Alkaline Phosphatase: 114 U/L (ref 39–117)
BUN: 18 mg/dL (ref 6–23)
CHLORIDE: 106 meq/L (ref 96–112)
CO2: 23 mEq/L (ref 19–32)
Calcium: 9.6 mg/dL (ref 8.4–10.5)
Creat: 0.84 mg/dL (ref 0.50–1.10)
Glucose, Bld: 85 mg/dL (ref 70–99)
POTASSIUM: 4.1 meq/L (ref 3.5–5.3)
SODIUM: 139 meq/L (ref 135–145)
TOTAL PROTEIN: 7.7 g/dL (ref 6.0–8.3)
Total Bilirubin: 0.5 mg/dL (ref 0.2–1.2)

## 2014-04-09 LAB — TSH: TSH: 1.162 u[IU]/mL (ref 0.350–4.500)

## 2014-04-09 MED ORDER — ALBUTEROL SULFATE HFA 108 (90 BASE) MCG/ACT IN AERS: 2.0000 | INHALATION_SPRAY | Freq: Four times a day (QID) | RESPIRATORY_TRACT | Status: DC | PRN

## 2014-04-09 MED ORDER — AMLODIPINE BESYLATE 10 MG PO TABS: 10.0000 mg | ORAL_TABLET | Freq: Every day | ORAL | Status: DC

## 2014-04-09 MED ORDER — BECLOMETHASONE DIPROPIONATE 80 MCG/ACT IN AERS: 1.0000 | INHALATION_SPRAY | Freq: Two times a day (BID) | RESPIRATORY_TRACT | Status: DC

## 2014-04-09 MED ORDER — FLUTICASONE PROPIONATE 50 MCG/ACT NA SUSP: 2.0000 | Freq: Every day | NASAL | Status: DC

## 2014-04-09 NOTE — Patient Instructions (Signed)
I will be in touch with the rest of your labs.  Try the flonase spray for your allergies as needed.  Come back and see [place] at your convenience for x-rays of your knees.

## 2014-04-09 NOTE — Progress Notes (Signed)
[facility] [address], [place] [address] [phone]  Date:  04/09/2014   Name:  Nicole Reyes   DOB:  06-14-1956   MRN:  [id number]  PCP:  Lamar Blinks, MD    Chief Complaint: Annual Exam   History of Present Illness:  Nicole Reyes is a 58 y.o. very pleasant female patient who presents with the following:  Here today for a CPE.  History of asthma, HTN, elevated cholesterol, obesity. Last cholesterol panel in February of this year showed mild elevation.    She does not check her BP at home.   Last colonoscopy was in 2012.   She sees OB- Dr. Deatra Ina- for her mammogram and pap.   She does exercise a few times a week- she enjoys doing cardio and "a little weights."  She is frustrated by difficulty with weight loss.   She does use zyrtec for her allergies.  Her asthma is under good control with Qvar and she rarely needs her albuterol.    Notes some pain in her left knee more over the last couple of month.  No recent injury.  Her knees feel better now that the weather is improved.    Wt Readings from Last 3 Encounters:  04/09/14 201 lb (91.173 kg)  02/10/14 202 lb 12.8 oz (91.989 kg)  08/09/13 200 lb 3.2 oz (90.81 kg)    Patient Active Problem List   Diagnosis Date Noted  . Pure hypercholesterolemia 02/10/2014  . Asthma 12/19/2012  . HTN (hypertension) 12/19/2012    Past Medical History  Diagnosis Date  . Asthma   . Anxiety   . Hypertension   . Allergy     Past Surgical History  Procedure Laterality Date  . Laparoscopy      REMOVED SCARE TISSUE FROM OVARIES  . Dilation and curettage of uterus    . Colonoscopy&endoscopy  1980's    PT UNSURE PLACE&MD WHO DID EXAMS=NORMAL  . Colonoscopy  06/29/2011    Normal    History  Substance Use Topics  . Smoking status: Never Smoker   . Smokeless tobacco: Never Used  . Alcohol Use: No    Family History  Problem Relation Age of Onset  . Colon cancer Neg Hx   . Alcohol abuse  Father   . Heart disease Father 75    AMI age 65  . Stroke Father 61    CVA  . Hypertension Mother   . Asthma Mother   . Hypertension Sister   . Hypertension Brother   . Hypertension Sister   . Hypertension Sister   . Hypertension Sister   . Hypertension Brother     No Known Allergies  Medication list has been reviewed and updated.  Current Outpatient Prescriptions on File Prior to Visit  Medication Sig Dispense Refill  . albuterol (PROVENTIL HFA;VENTOLIN HFA) 108 (90 BASE) MCG/ACT inhaler Inhale 2 puffs into the lungs every 6 (six) hours as needed for wheezing.  1 Inhaler  11  . albuterol (PROVENTIL) (2.5 MG/3ML) 0.083% nebulizer solution Take 3 mLs (2.5 mg total) by nebulization every 4 (four) hours as needed for wheezing.  150 mL  1  . amLODipine (NORVASC) 10 MG tablet Take 1 tablet (10 mg total) by mouth daily.  30 tablet  11  . aspirin 81 MG tablet Take 81 mg by mouth daily.        . beclomethasone (QVAR) 80 MCG/ACT inhaler Inhale 1 puff into the lungs 2 (two) times daily.  1 Inhaler  12  . ipratropium (ATROVENT) 0.03 % nasal spray Place 2 sprays into the nose 4 (four) times daily.  30 mL  6  . triamcinolone cream (KENALOG) 0.1 % APPLY TO AFFECTED AREA TWICE A DAY. (NOT TO FACE, GENITALS, OR AXILLES)  45 g  1   No current facility-administered medications on file prior to visit.    Review of Systems:  As per HPI- otherwise negative.   Physical Examination: Filed Vitals:   04/09/14 0833  BP: 143/90  Pulse: 79  Temp: 98.1 F (36.7 C)  Resp: 16   Filed Vitals:   04/09/14 0833  Height: 5' 5.5" (1.664 m)  Weight: 201 lb (91.173 kg)   Body mass index is 32.93 kg/(m^2). Ideal Body Weight: Weight in (lb) to have BMI = 25: 152.2  GEN: WDWN, NAD, Non-toxic, A & O x 3, obese, looks well HEENT: Atraumatic, Normocephalic. Neck supple. No masses, No LAD.  Bilateral TM wnl, oropharynx normal.  PEERL,EOMI.   Ears and Nose: No external deformity. CV: RRR, No M/G/R. No  JVD. No thrill. No extra heart sounds. PULM: CTA B, no wheezes, crackles, rhonchi. No retractions. No resp. distress. No accessory muscle use. ABD: S, NT, ND, +BS. No rebound. No HSM. EXTR: No c/c/e NEURO Normal gait.  PSYCH: Normally interactive. Conversant. Not depressed or anxious appearing.  Calm demeanor.  Both knees show crepitus, no effusion, heat or swelling    Assessment and Plan: Physical exam - Plan: CBC, Comprehensive metabolic panel, TSH  HTN (hypertension) - Plan: amLODipine (NORVASC) 10 MG tablet  Asthma - Plan: beclomethasone (QVAR) 80 MCG/ACT inhaler, DISCONTINUED: beclomethasone (QVAR) 80 MCG/ACT inhaler  Wheezing - Plan: albuterol (PROVENTIL HFA;VENTOLIN HFA) 108 (90 BASE) MCG/ACT inhaler  Allergic rhinitis - Plan: fluticasone (FLONASE) 50 MCG/ACT nasal spray   Labs pending as above.   BP is controlled encouraged continued exercise, more emphasis on lifting weights to help with weight control.   Asthma controlled Health maintenance UTD  Signed Lamar Blinks, MD

## 2014-04-26 ENCOUNTER — Other Ambulatory Visit: Payer: Self-pay | Admitting: Family Medicine

## 2014-05-13 ENCOUNTER — Ambulatory Visit (INDEPENDENT_AMBULATORY_CARE_PROVIDER_SITE_OTHER): Payer: PRIVATE HEALTH INSURANCE | Admitting: Emergency Medicine

## 2014-05-13 DIAGNOSIS — L255 Unspecified contact dermatitis due to plants, except food: Secondary | ICD-10-CM

## 2014-05-13 MED ORDER — METHYLPREDNISOLONE ACETATE 80 MG/ML IJ SUSP
80.0000 mg | Freq: Once | INTRAMUSCULAR | Status: AC
  Administered 2014-05-13: 80 mg via INTRAMUSCULAR

## 2014-05-13 MED ORDER — TRIAMCINOLONE ACETONIDE 0.1 % EX CREA: 1.0000 "application " | TOPICAL_CREAM | Freq: Two times a day (BID) | CUTANEOUS | Status: DC

## 2014-05-13 MED ORDER — ALUM SULFATE-CA ACETATE EX PACK: 1.0000 | PACK | Freq: Three times a day (TID) | CUTANEOUS | Status: DC

## 2014-05-13 NOTE — Progress Notes (Signed)
[facility] [address], [place] [address] [phone]  Date:  05/13/2014   Name:  Nicole Reyes   DOB:  25-Dec-1955   MRN:  [id number]  PCP:  Lamar Blinks, MD    Chief Complaint: No chief complaint on file.   History of Present Illness:  Nicole Reyes is a 58 y.o. very pleasant female patient who presents with the following:  Was at [facility] and removed some vegetation and now she has a rash on her left arm that is quite extensive, pruritic and laden with vesicles and bullae.  No respiratory symptoms or difficulty swallowing.  No cough or fever.  No improvement with over the counter medications or other home remedies. Denies other complaint or health concern today.   Patient Active Problem List   Diagnosis Date Noted  . Pure hypercholesterolemia 02/10/2014  . Asthma 12/19/2012  . HTN (hypertension) 12/19/2012    Past Medical History  Diagnosis Date  . Asthma   . Anxiety   . Hypertension   . Allergy     Past Surgical History  Procedure Laterality Date  . Laparoscopy      REMOVED SCARE TISSUE FROM OVARIES  . Dilation and curettage of uterus    . Colonoscopy&endoscopy  1980's    PT UNSURE PLACE&MD WHO DID EXAMS=NORMAL  . Colonoscopy  06/29/2011    Normal    History  Substance Use Topics  . Smoking status: Never Smoker   . Smokeless tobacco: Never Used  . Alcohol Use: No    Family History  Problem Relation Age of Onset  . Colon cancer Neg Hx   . Alcohol abuse Father   . Heart disease Father 54    AMI age 42  . Stroke Father 53    CVA  . Hypertension Mother   . Asthma Mother   . Hypertension Sister   . Hypertension Brother   . Hypertension Sister   . Hypertension Sister   . Hypertension Sister   . Hypertension Brother     No Known Allergies  Medication list has been reviewed and updated.  Current Outpatient Prescriptions on File Prior to Visit  Medication Sig Dispense Refill  . albuterol (PROVENTIL  HFA;VENTOLIN HFA) 108 (90 BASE) MCG/ACT inhaler Inhale 2 puffs into the lungs every 6 (six) hours as needed for wheezing.  3 Inhaler  3  . albuterol (PROVENTIL) (2.5 MG/3ML) 0.083% nebulizer solution Take 3 mLs (2.5 mg total) by nebulization every 4 (four) hours as needed for wheezing.  150 mL  1  . amLODipine (NORVASC) 10 MG tablet Take 1 tablet (10 mg total) by mouth daily.  90 tablet  3  . aspirin 81 MG tablet Take 81 mg by mouth daily.        . beclomethasone (QVAR) 80 MCG/ACT inhaler Inhale 1 puff into the lungs 2 (two) times daily.  3 Inhaler  3  . fluticasone (FLONASE) 50 MCG/ACT nasal spray Place 2 sprays into both nostrils daily.  16 g  6  . ipratropium (ATROVENT) 0.03 % nasal spray Place 2 sprays into the nose 4 (four) times daily.  30 mL  6  . triamcinolone cream (KENALOG) 0.1 % APPLY TO AFFECTED AREA TWICE A DAY. (NOT TO FACE, GENITALS, OR AXILLES)  45 g  1   No current facility-administered medications on file prior to visit.    Review of Systems:  As per HPI, otherwise negative.    Physical Examination: There were no vitals filed for this  visit. There were no vitals filed for this visit. There is no weight on file to calculate BMI. Ideal Body Weight:     GEN: WDWN, NAD, Non-toxic, Alert & Oriented x 3 HEENT: Atraumatic, Normocephalic.  Ears and Nose: No external deformity. EXTR: No clubbing/cyanosis/edema NEURO: Normal gait.  PSYCH: Normally interactive. Conversant. Not depressed or anxious appearing.  Calm demeanor.  SKIN:  Extensive rash on left arm.  Vesicles and bullae.    Assessment and Plan: Poison ivy Depo medrol Benadryl Tac [address]  Signed,  Ellison Carwin, MD

## 2014-05-13 NOTE — Patient Instructions (Signed)
Poison Ivy Poison ivy is a inflammation of the skin (contact dermatitis) caused by touching the allergens on the leaves of the ivy plant following previous exposure to the plant. The rash usually appears 48 hours after exposure. The rash is usually bumps (papules) or blisters (vesicles) in a linear pattern. Depending on your own sensitivity, the rash may simply cause redness and itching, or it may also progress to blisters which may break open. These must be well cared for to prevent secondary bacterial (germ) infection, followed by scarring. Keep any open areas dry, clean, dressed, and covered with an antibacterial ointment if needed. The eyes may also get puffy. The puffiness is worst in the morning and gets better as the day progresses. This dermatitis usually heals without scarring, within 2 to 3 weeks without treatment. HOME CARE INSTRUCTIONS  Thoroughly wash with soap and water as soon as you have been exposed to poison ivy. You have about one half hour to remove the plant resin before it will cause the rash. This washing will destroy the oil or antigen on the skin that is causing, or will cause, the rash. Be sure to wash under your fingernails as any plant resin there will continue to spread the rash. Do not rub skin vigorously when washing affected area. Poison ivy cannot spread if no oil from the plant remains on your body. A rash that has progressed to weeping sores will not spread the rash unless you have not washed thoroughly. It is also important to wash any clothes you have been wearing as these may carry active allergens. The rash will return if you wear the unwashed clothing, even several days later. Avoidance of the plant in the future is the best measure. Poison ivy plant can be recognized by the number of leaves. Generally, poison ivy has three leaves with flowering branches on a single stem. Diphenhydramine may be purchased over the counter and used as needed for itching. Do not drive with  this medication if it makes you drowsy.Ask your caregiver about medication for children. SEEK MEDICAL CARE IF:  Open sores develop.  Redness spreads beyond area of rash.  You notice purulent (pus-like) discharge.  You have increased pain.  Other signs of infection develop (such as fever). Document Released: 11/27/2000 Document Revised: 02/22/2012 Document Reviewed: 10/16/2009 Exit[employer] Patient Information 2014 [employer].  

## 2014-10-03 LAB — HM MAMMOGRAPHY

## 2014-10-20 ENCOUNTER — Ambulatory Visit (INDEPENDENT_AMBULATORY_CARE_PROVIDER_SITE_OTHER): Payer: PRIVATE HEALTH INSURANCE | Admitting: Family Medicine

## 2014-10-20 VITALS — BP 120/80 | HR 72 | Temp 97.9°F | Resp 16 | Ht 66.5 in | Wt 203.0 lb

## 2014-10-20 DIAGNOSIS — J45901 Unspecified asthma with (acute) exacerbation: Secondary | ICD-10-CM

## 2014-10-20 MED ORDER — PREDNISONE 20 MG PO TABS: ORAL_TABLET | ORAL | Status: DC

## 2014-10-20 MED ORDER — BUDESONIDE-FORMOTEROL FUMARATE 80-4.5 MCG/ACT IN AERO: 2.0000 | INHALATION_SPRAY | Freq: Two times a day (BID) | RESPIRATORY_TRACT | Status: DC

## 2014-10-20 MED ORDER — IPRATROPIUM BROMIDE 0.02 % IN SOLN
0.5000 mg | Freq: Once | RESPIRATORY_TRACT | Status: AC
  Administered 2014-10-20: 0.5 mg via RESPIRATORY_TRACT

## 2014-10-20 MED ORDER — ALBUTEROL SULFATE (2.5 MG/3ML) 0.083% IN NEBU
2.5000 mg | INHALATION_SOLUTION | Freq: Once | RESPIRATORY_TRACT | Status: AC
  Administered 2014-10-20: 2.5 mg via RESPIRATORY_TRACT

## 2014-10-20 NOTE — Progress Notes (Signed)
Subjective:    Patient ID: Nicole Reyes, female    DOB: 1956/10/23, 58 y.o.   MRN: [id number] Patient Active Problem List   Diagnosis Date Noted  . Pure hypercholesterolemia 02/10/2014  . Asthma 12/19/2012  . HTN (hypertension) 12/19/2012   Prior to Admission medications   Medication Sig Start Date End Date Taking? Authorizing Provider  albuterol (PROVENTIL HFA;VENTOLIN HFA) 108 (90 BASE) MCG/ACT inhaler Inhale 2 puffs into the lungs every 6 (six) hours as needed for wheezing. 04/09/14  Yes Gay Filler Copland, MD  albuterol (PROVENTIL) (2.5 MG/3ML) 0.083% nebulizer solution Take 3 mLs (2.5 mg total) by nebulization every 4 (four) hours as needed for wheezing. 01/24/13  Yes Roselee Culver, MD  amLODipine (NORVASC) 10 MG tablet Take 1 tablet (10 mg total) by mouth daily. 04/09/14  Yes Gay Filler Copland, MD  aspirin 81 MG tablet Take 81 mg by mouth daily.     Yes Historical Provider, MD  beclomethasone (QVAR) 80 MCG/ACT inhaler Inhale 1 puff into the lungs 2 (two) times daily. 04/09/14 04/22/15 Yes Jessica C Copland, MD  fluticasone (FLONASE) 50 MCG/ACT nasal spray Place 2 sprays into both nostrils daily. 04/09/14  Yes Jessica C Copland, MD  ipratropium (ATROVENT) 0.03 % nasal spray Place 2 sprays into the nose 4 (four) times daily. 06/26/13  Yes Gay Filler Copland, MD  triamcinolone cream (KENALOG) 0.1 % Apply 1 application topically 2 (two) times daily. 05/13/14  Yes Roselee Culver, MD  budesonide-formoterol [facility]) 80-4.5 MCG/ACT inhaler Inhale 2 puffs into the lungs 2 (two) times daily. 10/20/14   Ezekiel Slocumb, PA-C  predniSONE (DELTASONE) 20 MG tablet Take 3 PO QAM x3days, 2 PO QAM x3days, 1 PO QAM x3days 10/20/14   Bennett Scrape V, PA-C  triamcinolone cream (KENALOG) 0.1 % APPLY TO AFFECTED AREA TWICE A DAY. (NOT TO FACE, GENITALS, OR AXILLES) 11/28/13   Darreld Mclean, MD   No Known Allergies  HPI  This is a 58 year old female with PMH asthma who is presenting today with  asthma problems for 2 days. She has been coughing and wheezing. At baseline she uses qvar BID and albuterol BID. She has increased her albuterol usage to QID. She is having night symptoms currently but does not have night symptoms at baseline. She reports she uses albuterol BID mostly out of habit and not for symptoms. Triggers for her asthma are weather changes. Her last asthma exacerbation was in 01/15. She has never been hospitalized for her asthma. She reports she is also having some nasal congestion and right ear pain.   Review of Systems  Constitutional: Negative for fever and chills.  HENT: Positive for congestion and ear pain. Negative for postnasal drip, sinus pressure and sore throat.   Eyes: Negative.   Respiratory: Positive for cough, shortness of breath and wheezing.   Skin: Negative.   Allergic/Immunologic: Positive for environmental allergies.      Objective:   Physical Exam  Constitutional: She is oriented to person, place, and time. She appears well-developed and well-nourished. No distress.  HENT:  Head: Normocephalic and atraumatic.  Right Ear: Hearing, external ear and ear canal normal. A middle ear effusion is present.  Left Ear: Hearing, tympanic membrane, external ear and ear canal normal.  No middle ear effusion.  Nose: Nose normal.  Mouth/Throat: Uvula is midline, oropharynx is clear and moist and mucous membranes are normal.  Eyes: Conjunctivae and lids are normal. Right eye exhibits no discharge. Left eye exhibits no  discharge. No scleral icterus.  Cardiovascular: Normal rate, regular rhythm, normal heart sounds and normal pulses.   No murmur heard. Pulmonary/Chest: Effort normal. She has decreased breath sounds. She has wheezes. She has no rhonchi. She has no rales.  Decreased wheezing and increased breath sounds after duoneb treatment  Lymphadenopathy:       Head (right side): No submental, no submandibular, no tonsillar and no occipital adenopathy present.        Head (left side): No submental, no submandibular, no tonsillar and no occipital adenopathy present.    She has no cervical adenopathy.  Neurological: She is alert and oriented to person, place, and time.  Skin: Skin is warm, dry and intact. No lesion and no rash noted.  Psychiatric: She has a normal mood and affect. Her speech is normal and behavior is normal. Thought content normal.      Assessment & Plan:  1. Asthma with acute exacerbation, unspecified asthma severity Will treat acute asthma exacerbation with a tapering dose of prednisone. Patient is not well controlled on current asthma regimen due to BID use of proair. She will start symbicort BID and only use proair PRN. She will return with any issues.   - albuterol (PROVENTIL) (2.5 MG/3ML) 0.083% nebulizer solution 2.5 mg; Take 3 mLs (2.5 mg total) by nebulization once. - ipratropium (ATROVENT) nebulizer solution 0.5 mg; Take 2.5 mLs (0.5 mg total) by nebulization once. - budesonide-formoterol (SYMBICORT) 80-4.5 MCG/ACT inhaler; Inhale 2 puffs into the lungs 2 (two) times daily.  Dispense: 1 Inhaler; Refill: 3 - predniSONE (DELTASONE) 20 MG tablet; Take 3 PO QAM x3days, 2 PO QAM x3days, 1 PO QAM x3days  Dispense: 18 tablet; Refill: 0   Benjaman Pott. Drenda Freeze, MHS [facility]  10/20/2014

## 2014-10-20 NOTE — Patient Instructions (Signed)
Use the symbicort twice a day. Only use the proair when you need it. Take the prednisone until you finish it. If you have any problems let me know.

## 2014-10-23 NOTE — Progress Notes (Signed)
Patient discussed with Ms. Bush. Agree with assessment and plan of care per her note.   

## 2014-11-09 ENCOUNTER — Ambulatory Visit (INDEPENDENT_AMBULATORY_CARE_PROVIDER_SITE_OTHER): Payer: PRIVATE HEALTH INSURANCE | Admitting: Emergency Medicine

## 2014-11-09 VITALS — BP 124/72 | HR 81 | Temp 97.6°F | Resp 18 | Ht 66.0 in | Wt 205.0 lb

## 2014-11-09 DIAGNOSIS — T7840XA Allergy, unspecified, initial encounter: Secondary | ICD-10-CM

## 2014-11-09 MED ORDER — METHYLPREDNISOLONE SODIUM SUCC 125 MG IJ SOLR
125.0000 mg | Freq: Once | INTRAMUSCULAR | Status: AC
  Administered 2014-11-09: 125 mg via INTRAMUSCULAR

## 2014-11-09 MED ORDER — CETIRIZINE HCL 10 MG PO TABS
10.0000 mg | ORAL_TABLET | Freq: Once | ORAL | Status: AC
  Administered 2014-11-09: 10 mg via ORAL

## 2014-11-09 MED ORDER — RANITIDINE HCL 150 MG PO TABS
150.0000 mg | ORAL_TABLET | Freq: Once | ORAL | Status: AC
  Administered 2014-11-09: 150 mg via ORAL

## 2014-11-09 MED ORDER — DIPHENHYDRAMINE HCL 50 MG/ML IJ SOLN
50.0000 mg | Freq: Once | INTRAMUSCULAR | Status: AC
  Administered 2014-11-09: 50 mg via INTRAMUSCULAR

## 2014-11-09 MED ORDER — PREDNISONE 20 MG PO TABS: ORAL_TABLET | ORAL | Status: DC

## 2014-11-09 NOTE — Progress Notes (Signed)
Subjective:    Patient ID: Nicole Reyes, female    DOB: 1956/05/26, 58 y.o.   MRN: [id number]   PCP: Lamar Blinks, MD  Chief Complaint  Patient presents with  . Shortness of Breath    x2 days   . Allergic Reaction  . Facial Swelling    with itching     No Known Allergies  Patient Active Problem List   Diagnosis Date Noted  . Pure hypercholesterolemia 02/10/2014  . Asthma 12/19/2012  . HTN (hypertension) 12/19/2012    Prior to Admission medications   Medication Sig Start Date End Date Taking? Authorizing Provider  albuterol (PROVENTIL HFA;VENTOLIN HFA) 108 (90 BASE) MCG/ACT inhaler Inhale 2 puffs into the lungs every 6 (six) hours as needed for wheezing. 04/09/14  Yes Gay Filler Copland, MD  albuterol (PROVENTIL) (2.5 MG/3ML) 0.083% nebulizer solution Take 3 mLs (2.5 mg total) by nebulization every 4 (four) hours as needed for wheezing. 01/24/13  Yes Roselee Culver, MD  amLODipine (NORVASC) 10 MG tablet Take 1 tablet (10 mg total) by mouth daily. 04/09/14  Yes Gay Filler Copland, MD  aspirin 81 MG tablet Take 81 mg by mouth daily.     Yes Historical Provider, MD  budesonide-formoterol (SYMBICORT) 80-4.5 MCG/ACT inhaler Inhale 2 puffs into the lungs 2 (two) times daily. 10/20/14  Yes Ezekiel Slocumb, PA-C    Medical, Surgical, Family and Social History reviewed and updated.  HPI  This 58 y.o. female presents for evaluation of facial swelling and SOB. I was called to the room urgently.  Since yesterday, she has had swelling of the face, such that her eyes were swollen shut. This morning about 2 am she awoke feeling SOB, so came in this morning.  Symptoms began 4 days ago when she noticed two red bumps on her cheeks.  She changed from Qvar to Symbicort for management of her asthma on 10/20/2014, and recently completed a course of oral prednisone for an acute flare.  She stopped the Symbicort, but symptoms have progressed.  She notes that the day prior to the onset of  swelling, she used a new skin care product (Neutrogena facial wipes) for an itchy area on her LEFT cheek. She had a similar reaction on the upper back when she used Noxema on a sunburn several years ago.  No itching in the throat. No swelling of the tongue or mouth.  No sore throat. No HA, dizziness.   Review of Systems As above.    Objective:   Physical Exam  Constitutional: She is oriented to person, place, and time. She appears well-developed and well-nourished. She is active and cooperative. No distress.  BP 124/72 mmHg  Pulse 81  Temp(Src) 97.6 F (36.4 C) (Oral)  Resp 18  Ht 5\' 6"  (1.676 m)  Wt 205 lb (92.987 kg)  BMI 33.10 kg/m2  SpO2 100%  She was placed on 2L O2 by [place]. Benadryl 50 mg IM. Oral Zantac 150 mg and Zyrtec 10 mg.   HENT:  Head: Normocephalic and atraumatic.  Mouth/Throat: Uvula is midline and mucous membranes are normal. No oropharyngeal exudate, posterior oropharyngeal edema, posterior oropharyngeal erythema or tonsillar abscesses.    Eyes: Conjunctivae are normal.  Pulmonary/Chest: Effort normal and breath sounds normal.  Neurological: She is alert and oriented to person, place, and time.  Skin: Skin is warm and dry.  Face is swollen bilaterally, only below the eyes and sparing the nose and mouth. Mild crusting on both cheeks, but no discrete lesions.  Psychiatric: She has a normal mood and affect. Her speech is normal and behavior is normal.   Shortness of breath resolved promptly with O2 and Benadryl.       Assessment & Plan:  1. Allergic reaction, initial encounter Stop the offending product. Supportive skin care. OTC oral antihistamine and H2 blocker. Resume Symbicort as her symptoms improve (prednisone will maintain her asthma in the meantime).  - diphenhydrAMINE (BENADRYL) injection 50 mg; Inject 1 mL (50 mg total) into the muscle once. - ranitidine (ZANTAC) tablet 150 mg; Take 1 tablet (150 mg total) by mouth once. - cetirizine (ZYRTEC)  tablet 10 mg; Take 1 tablet (10 mg total) by mouth once. - methylPREDNISolone sodium succinate (SOLU-MEDROL) 125 mg/2 mL injection 125 mg; Inject 2 mLs (125 mg total) into the muscle once. - predniSONE (DELTASONE) 20 MG tablet; Take 3 PO QAM x3days, 2 PO QAM x3days, 1 PO QAM x3days  Dispense: 18 tablet; Refill: 0  Patient Instructions  Stop the Neutrogena wipes. That is the most likely culprit of this reaction. Use Aquaphor or Cetaphil or Eucerin moisturizer twice daily on your face to moisturize the very dry skin. As your skin clears, resume the Symbicort. The prednisone should control your asthma in the meantime. Please take an OTC antihistamine (Claritin, Zyrtec or Allegra) each day. Please take an OTC H2 blocker (Zyrtec, Pepcid) twice daily until your symptoms are resolved. If you have ANY symptoms of your mouth, lips, throat or shortness of breath, please return or go to the Emergency Department.    Discussed with Dr. Ouida Sills.  Fara Chute, PA-C Physician Assistant-Certified [facility]

## 2014-11-09 NOTE — Patient Instructions (Signed)
Stop the Neutrogena wipes. That is the most likely culprit of this reaction. Use Aquaphor or Cetaphil or Eucerin moisturizer twice daily on your face to moisturize the very dry skin. As your skin clears, resume the Symbicort. The prednisone should control your asthma in the meantime. Please take an OTC antihistamine (Claritin, Zyrtec or Allegra) each day. Please take an OTC H2 blocker (Zyrtec, Pepcid) twice daily until your symptoms are resolved. If you have ANY symptoms of your mouth, lips, throat or shortness of breath, please return or go to the Emergency Department.

## 2015-01-25 ENCOUNTER — Ambulatory Visit (INDEPENDENT_AMBULATORY_CARE_PROVIDER_SITE_OTHER): Payer: Managed Care, Other (non HMO) | Admitting: Family Medicine

## 2015-01-25 VITALS — BP 136/80 | HR 70 | Temp 97.9°F | Resp 16 | Ht 67.0 in | Wt 211.0 lb

## 2015-01-25 DIAGNOSIS — Z130 Encounter for screening for diseases of the blood and blood-forming organs and certain disorders involving the immune mechanism: Secondary | ICD-10-CM

## 2015-01-25 DIAGNOSIS — Z13220 Encounter for screening for lipoid disorders: Secondary | ICD-10-CM

## 2015-01-25 DIAGNOSIS — E669 Obesity, unspecified: Secondary | ICD-10-CM | POA: Insufficient documentation

## 2015-01-25 DIAGNOSIS — Z1329 Encounter for screening for other suspected endocrine disorder: Secondary | ICD-10-CM

## 2015-01-25 DIAGNOSIS — Z131 Encounter for screening for diabetes mellitus: Secondary | ICD-10-CM

## 2015-01-25 DIAGNOSIS — Z0000 Encounter for general adult medical examination without abnormal findings: Secondary | ICD-10-CM

## 2015-01-25 LAB — LIPID PANEL
CHOL/HDL RATIO: 3.5 ratio
Cholesterol: 223 mg/dL — ABNORMAL HIGH (ref 0–200)
HDL: 64 mg/dL (ref >39)
LDL Cholesterol: 145 mg/dL — ABNORMAL HIGH (ref 0–99)
TRIGLYCERIDES: 72 mg/dL (ref <150)
VLDL: 14 mg/dL (ref 0–40)

## 2015-01-25 LAB — CBC
HCT: 35.5 % — ABNORMAL LOW (ref 36.0–46.0)
HEMOGLOBIN: 11.9 g/dL — AB (ref 12.0–15.0)
MCH: 24 pg — AB (ref 26.0–34.0)
MCHC: 33.5 g/dL (ref 30.0–36.0)
MCV: 71.6 fL — ABNORMAL LOW (ref 78.0–100.0)
PLATELETS: 501 10*3/uL — AB (ref 150–400)
RBC: 4.96 MIL/uL (ref 3.87–5.11)
RDW: 16.1 % — ABNORMAL HIGH (ref 11.5–15.5)
WBC: 5.8 10*3/uL (ref 4.0–10.5)

## 2015-01-25 LAB — COMPREHENSIVE METABOLIC PANEL
ALBUMIN: 4.2 g/dL (ref 3.5–5.2)
ALK PHOS: 109 U/L (ref 39–117)
ALT: 22 U/L (ref 0–35)
AST: 31 U/L (ref 0–37)
BUN: 13 mg/dL (ref 6–23)
CO2: 25 mEq/L (ref 19–32)
CREATININE: 0.77 mg/dL (ref 0.50–1.10)
Calcium: 9.3 mg/dL (ref 8.4–10.5)
Chloride: 107 mEq/L (ref 96–112)
GLUCOSE: 85 mg/dL (ref 70–99)
Potassium: 4.2 mEq/L (ref 3.5–5.3)
Sodium: 139 mEq/L (ref 135–145)
TOTAL PROTEIN: 7.5 g/dL (ref 6.0–8.3)
Total Bilirubin: 0.5 mg/dL (ref 0.2–1.2)

## 2015-01-25 LAB — FERRITIN: Ferritin: 14 ng/mL (ref 10–291)

## 2015-01-25 LAB — TSH: TSH: 1.379 u[IU]/mL (ref 0.350–4.500)

## 2015-01-25 NOTE — Patient Instructions (Signed)
Good to see you today- I will be in touch with your labs and will complete and send in your form asap.  Please do work on your weight- if you could work toward a goal of reaching 200 lbs over the next 6 months that would be wonderful.  I will check your thyroid to make sure this is not contributing to weight gain  Wt Readings from Last 3 Encounters:  01/25/15 211 lb (95.709 kg)  11/09/14 205 lb (92.987 kg)  10/20/14 203 lb (92.08 kg)

## 2015-01-25 NOTE — Progress Notes (Signed)
[facility] [address] [place] [address] [phone]  Date:  01/25/2015   Name:  Nicole Reyes   DOB:  1956-07-20   MRN:  [id number]  PCP:  Lamar Blinks, MD    Chief Complaint: Employment Physical   History of Present Illness:  Nicole Reyes is a 59 y.o. very pleasant female patient who presents with the following:  Here today for a CPE and form for her job.   She is fasting today for labs.   Her asthma is under good control.  She started symbicort in the fall- she feels that his is helping control her sx.   She rarely needs heralbuterol.    She is on norvasc for her HTN  Also one of her brohters recently died of an MI.    She sees OBG for her breast exam and pap- her last pap was done 2 years ago, mammogram is UTD.   Patient Active Problem List   Diagnosis Date Noted  . Pure hypercholesterolemia 02/10/2014  . Asthma 12/19/2012  . HTN (hypertension) 12/19/2012    Past Medical History  Diagnosis Date  . Asthma   . Anxiety   . Hypertension   . Allergy     Past Surgical History  Procedure Laterality Date  . Laparoscopy      REMOVED SCARE TISSUE FROM OVARIES  . Dilation and curettage of uterus    . Colonoscopy&endoscopy  1980's    PT UNSURE PLACE&MD WHO DID EXAMS=NORMAL  . Colonoscopy  06/29/2011    Normal    History  Substance Use Topics  . Smoking status: Never Smoker   . Smokeless tobacco: Never Used  . Alcohol Use: No    Family History  Problem Relation Age of Onset  . Colon cancer Neg Hx   . Alcohol abuse Father   . Heart disease Father 9    AMI age 40  . Stroke Father 39    CVA  . Hypertension Mother   . Asthma Mother   . Hypertension Sister   . Hypertension Brother   . Hypertension Sister   . Hypertension Sister   . Hypertension Sister   . Hypertension Brother     No Known Allergies  Medication list has been reviewed and updated.  Current Outpatient Prescriptions on File Prior to Visit   Medication Sig Dispense Refill  . albuterol (PROVENTIL HFA;VENTOLIN HFA) 108 (90 BASE) MCG/ACT inhaler Inhale 2 puffs into the lungs every 6 (six) hours as needed for wheezing. 3 Inhaler 3  . albuterol (PROVENTIL) (2.5 MG/3ML) 0.083% nebulizer solution Take 3 mLs (2.5 mg total) by nebulization every 4 (four) hours as needed for wheezing. 150 mL 1  . amLODipine (NORVASC) 10 MG tablet Take 1 tablet (10 mg total) by mouth daily. 90 tablet 3  . aspirin 81 MG tablet Take 81 mg by mouth daily.      . budesonide-formoterol (SYMBICORT) 80-4.5 MCG/ACT inhaler Inhale 2 puffs into the lungs 2 (two) times daily. 1 Inhaler 3   No current facility-administered medications on file prior to visit.    Review of Systems:  As per HPI- otherwise negative.   Physical Examination: Filed Vitals:   01/25/15 1240  Pulse: 70  Temp: 97.9 F (36.6 C)  Resp: 16   Filed Vitals:   01/25/15 1240  Height: 5\' 7"  (1.702 m)  Weight: 211 lb (95.709 kg)   Body mass index is 33.04 kg/(m^2). Ideal Body Weight: Weight in (lb) to have BMI =  25: 159.3  GEN: WDWN, NAD, Non-toxic, A & O x 3, obese, looks well HEENT: Atraumatic, Normocephalic. Neck supple. No masses, No LAD.  Bilateral TM wnl, oropharynx normal.  PEERL,EOMI.   Ears and Nose: No external deformity. CV: RRR, No M/G/R. No JVD. No thrill. No extra heart sounds. PULM: CTA B, no wheezes, crackles, rhonchi. No retractions. No resp. distress. No accessory muscle use. ABD: S, NT, ND. No rebound. No HSM. EXTR: No c/c/e NEURO Normal gait.  PSYCH: Normally interactive. Conversant. Not depressed or anxious appearing.  Calm demeanor.    Assessment and Plan: Screening for hyperlipidemia - Plan: Lipid panel  Screening for deficiency anemia - Plan: CBC, Ferritin  Screening for diabetes mellitus - Plan: Comprehensive metabolic panel, Hemoglobin A1c  Physical exam  Screening for hypothyroidism - Plan: TSH  Obesity  Await her labs and will follow-up with  her.  Also with labs will further discuss her cardiovascular risk factors/ risk reduction and consider stress test. Encouraged weight loss and she will work on it  Fax in form to her job  Signed Lamar Blinks, MD

## 2015-01-26 ENCOUNTER — Encounter: Payer: Self-pay | Admitting: Family Medicine

## 2015-01-26 LAB — HEMOGLOBIN A1C
Hgb A1c MFr Bld: 5.2 % (ref <5.7)
MEAN PLASMA GLUCOSE: 103 mg/dL (ref <117)

## 2015-02-19 ENCOUNTER — Other Ambulatory Visit: Payer: Self-pay | Admitting: Physician Assistant

## 2015-02-20 ENCOUNTER — Ambulatory Visit (INDEPENDENT_AMBULATORY_CARE_PROVIDER_SITE_OTHER): Payer: Managed Care, Other (non HMO) | Admitting: Family Medicine

## 2015-02-20 ENCOUNTER — Ambulatory Visit (INDEPENDENT_AMBULATORY_CARE_PROVIDER_SITE_OTHER): Payer: Managed Care, Other (non HMO)

## 2015-02-20 VITALS — BP 144/82 | HR 74 | Temp 97.7°F | Resp 18 | Ht 66.0 in | Wt 213.8 lb

## 2015-02-20 DIAGNOSIS — M25562 Pain in left knee: Secondary | ICD-10-CM

## 2015-02-20 DIAGNOSIS — G5603 Carpal tunnel syndrome, bilateral upper limbs: Secondary | ICD-10-CM

## 2015-02-20 DIAGNOSIS — G5602 Carpal tunnel syndrome, left upper limb: Secondary | ICD-10-CM | POA: Diagnosis not present

## 2015-02-20 DIAGNOSIS — G5601 Carpal tunnel syndrome, right upper limb: Secondary | ICD-10-CM

## 2015-02-20 MED ORDER — MELOXICAM 15 MG PO TABS: 15.0000 mg | ORAL_TABLET | Freq: Every day | ORAL | Status: DC

## 2015-02-20 MED ORDER — TRAMADOL HCL 50 MG PO TABS: 50.0000 mg | ORAL_TABLET | Freq: Three times a day (TID) | ORAL | Status: DC | PRN

## 2015-02-20 NOTE — Patient Instructions (Addendum)
Please wear your wrist braces as much as you can.  Use the mobic daily for 1-2 weeks for inflammation and pain Use the tramadol for more severe pain at night- this can make you feel sleepy, do not use when you need to drive  I will get you set up to see orthopedics about your knee  If your hands are not better soon please call me and we can take the next step

## 2015-02-20 NOTE — Progress Notes (Signed)
[facility] [address] [place] [address] [phone]  Date:  02/20/2015   Name:  Nicole Reyes   DOB:  1956/09/25   MRN:  [id number]  PCP:  Lamar Blinks, MD    Chief Complaint: Arm Pain and Knee Pain   History of Present Illness:  Nicole Reyes is a 59 y.o. very pleasant female patient who presents with the following:  Here today with a throbbing pain in both hands from the elbows down.  The left hand is worse than the right.  She has noted this for a month or so.  The numbness is more in the 4th and 5th fingers.  She is more numb in the am, and they can even wake her up at night due to pain.   She is not doing anything different with her hands that she is aware of.  Her hands also feel somewhat weak.  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    Also she has left knee pain.  She may use aleve as needed.  She has noted some problem with her knee for years but worse lately. It does not really seem to swell.  It can click and pop.  She had a knee scope for what sounds like a meniscal tear 20 years ago.  Today her knee felt like it might give way on her.   She did not fall but this worried her.    She is menopausal    Patient Active Problem List   Diagnosis Date Noted  . Obesity 01/25/2015  . Pure hypercholesterolemia 02/10/2014  . Asthma 12/19/2012  . HTN (hypertension) 12/19/2012    Past Medical History  Diagnosis Date  . Asthma   . Anxiety   . Hypertension   . Allergy     Past Surgical History  Procedure Laterality Date  . Laparoscopy      REMOVED SCARE TISSUE FROM OVARIES  . Dilation and curettage of uterus    . Colonoscopy&endoscopy  1980's    PT UNSURE PLACE&MD WHO DID EXAMS=NORMAL  . Colonoscopy  06/29/2011    Normal    History  Substance Use Topics  . Smoking status: Never Smoker   . Smokeless tobacco: Never Used   . Alcohol Use: No    Family History  Problem Relation Age of Onset  . Colon cancer Neg Hx   . Alcohol abuse Father   . Heart disease Father 38    AMI age 74  . Stroke Father 37    CVA  . Hypertension Mother   . Asthma Mother   . Hypertension Sister   . Hypertension Brother   . Hypertension Sister   . Hypertension Sister   . Hypertension Sister   . Hypertension Brother     No Known Allergies  Medication list has been reviewed and updated.  Current Outpatient Prescriptions on File Prior to Visit  Medication Sig Dispense Refill  . albuterol (PROVENTIL HFA;VENTOLIN HFA) 108 (90 BASE) MCG/ACT inhaler Inhale 2 puffs into the lungs every 6 (six) hours as needed for wheezing. 3 Inhaler 3  . albuterol (PROVENTIL) (2.5 MG/3ML) 0.083% nebulizer solution Take 3 mLs (2.5 mg total) by nebulization every 4 (four) hours as needed for wheezing. 150 mL 1  . amLODipine (NORVASC) 10 MG tablet Take 1 tablet (10 mg total) by mouth daily. 90 tablet  3  . aspirin 81 MG tablet Take 81 mg by mouth daily.      . SYMBICORT 80-4.5 MCG/ACT inhaler INHALE 2 PUFFS INTO THE LUNGS 2 (TWO) TIMES DAILY. 10.2 Inhaler 1   No current facility-administered medications on file prior to visit.    Review of Systems:  As per HPI- otherwise negative.   Physical Examination: Filed Vitals:   02/20/15 1642  BP: 144/82  Pulse: 74  Temp: 97.7 F (36.5 C)  Resp: 18   Filed Vitals:   02/20/15 1642  Height: 5\' 6"  (1.676 m)  Weight: 213 lb 12.8 oz (96.979 kg)   Body mass index is 34.52 kg/(m^2). Ideal Body Weight: Weight in (lb) to have BMI = 25: 154.6   GEN: WDWN, NAD, Non-toxic, A & O x 3, obese, looks well HEENT: Atraumatic, Normocephalic. Neck supple. No masses, No LAD. Ears and Nose: No external deformity. CV: RRR, No M/G/R. No JVD. No thrill. No extra heart sounds. PULM: CTA B, no wheezes, crackles, rhonchi. No retractions. No resp. distress. No accessory muscle use. EXTR: No c/c/e NEURO Normal  gait.  PSYCH: Normally interactive. Conversant. Not depressed or anxious appearing.  Calm demeanor.  She notes pain and "zinging" in her fingers with compression of the median nerve bilaterally, but left more than right.  Hands show normal strength, wrists with normal ROM, elbows wnl.  No swelling, heat or redness.  Left knee: crepitus, tenderness over the bottom of the patella.  Joint is stable, no effusion  [facility] reading (PRIMARY) by  Dr. Lorelei Pont. Left knee: moderate medial compartment and subpatellar OA, spurring LEFT KNEE - COMPLETE 4+ VIEW  COMPARISON: 04/26/2010  FINDINGS: Prominent medial compartmental articular space narrowing with mild lateral compartmental articular space narrowing. Degenerative patellofemoral arthropathy with spurring and mild loss of articular space, difficult to quantify given the somewhat off-axis sunrise view. Marginal spurring in both the medial and lateral compartments. No definite knee effusion.  IMPRESSION: 1. 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 Placed in bilateral wrist splints and encouraged her to use these at night and during the day when she can.  mobic daily, tramadol at bedtime as needed for more severe pain She will see if she can take a couple of days off work, or at least do lighter work.   If not better I will refer for further testing and care Referral to ortho for degenerative knee pain  Signed Lamar Blinks, MD

## 2015-03-31 ENCOUNTER — Other Ambulatory Visit: Payer: Self-pay | Admitting: Physician Assistant

## 2015-04-01 ENCOUNTER — Telehealth: Payer: Self-pay

## 2015-04-01 NOTE — Telephone Encounter (Signed)
Pt was recently prescribed SYMBICORT, but wants to know if there is a cheaper alternative.

## 2015-04-01 NOTE — Telephone Encounter (Signed)
Please advise 

## 2015-04-01 NOTE — Telephone Encounter (Signed)
The patient should contact the prescription benefit manager (usually the number is on the back of the insurance card) or the customer service department of her insurance to request a prescription drug formulary for her plan. She may be able to access it online.  From that list, we can select a preferred product. Alternatives are Advair and Dulera.

## 2015-04-02 NOTE — Telephone Encounter (Signed)
lMom to cb.

## 2015-04-02 NOTE — Telephone Encounter (Signed)
Gave pt message.

## 2015-04-26 ENCOUNTER — Other Ambulatory Visit: Payer: Self-pay | Admitting: Family Medicine

## 2015-05-24 ENCOUNTER — Telehealth: Payer: Self-pay | Admitting: *Deleted

## 2015-05-24 NOTE — Telephone Encounter (Signed)
Phoned Dr. Kelby Fam office and [facility] and request for [facility] in med rec for patient's most recent mammo results.  Will update health maintenance once received.

## 2015-05-24 NOTE — Telephone Encounter (Signed)
Mammo results were received via fax from Dr. Kelby Fam office, report dated 10/04/2014 - no mammographic evidence of malignancy.  Health maintenance updated and mammo abstracted.

## 2015-05-25 ENCOUNTER — Ambulatory Visit (INDEPENDENT_AMBULATORY_CARE_PROVIDER_SITE_OTHER): Payer: Managed Care, Other (non HMO) | Admitting: Physician Assistant

## 2015-05-25 VITALS — BP 134/80 | HR 84 | Temp 98.1°F | Resp 18 | Ht 65.0 in | Wt 212.0 lb

## 2015-05-25 DIAGNOSIS — J45901 Unspecified asthma with (acute) exacerbation: Secondary | ICD-10-CM

## 2015-05-25 DIAGNOSIS — L509 Urticaria, unspecified: Secondary | ICD-10-CM

## 2015-05-25 MED ORDER — ALBUTEROL SULFATE (2.5 MG/3ML) 0.083% IN NEBU
2.5000 mg | INHALATION_SOLUTION | Freq: Once | RESPIRATORY_TRACT | Status: AC
  Administered 2015-05-25: 2.5 mg via RESPIRATORY_TRACT

## 2015-05-25 MED ORDER — METHYLPREDNISOLONE ACETATE 80 MG/ML IJ SUSP
80.0000 mg | Freq: Once | INTRAMUSCULAR | Status: AC
  Administered 2015-05-25: 80 mg via INTRAMUSCULAR

## 2015-05-25 MED ORDER — HYDROXYZINE HCL 25 MG PO TABS: 12.5000 mg | ORAL_TABLET | Freq: Three times a day (TID) | ORAL | Status: DC | PRN

## 2015-05-25 MED ORDER — IPRATROPIUM BROMIDE 0.02 % IN SOLN
0.5000 mg | Freq: Once | RESPIRATORY_TRACT | Status: AC
  Administered 2015-05-25: 0.5 mg via RESPIRATORY_TRACT

## 2015-05-25 MED ORDER — ALBUTEROL SULFATE 108 (90 BASE) MCG/ACT IN AEPB: 2.0000 | INHALATION_SPRAY | RESPIRATORY_TRACT | Status: DC | PRN

## 2015-05-25 MED ORDER — BUDESONIDE-FORMOTEROL FUMARATE 80-4.5 MCG/ACT IN AERO: INHALATION_SPRAY | RESPIRATORY_TRACT | Status: DC

## 2015-05-25 NOTE — Progress Notes (Signed)
Subjective:    Patient ID: Pam Vanalstine, female    DOB: 30-May-1956, 59 y.o.   MRN: [id number]  HPI Patient presents for wheezing, cough, and rash that have been present for past 3 days. Wheezing and cough have gotten gradually worse and have kept her up for the past 2 nights. Cough is productive with yellow thick mucus. Endorses chest tightness as well. Has been out of symbicort and albuterol for past 2 weeks and request refills. Did not get sooner as she could not afford it at the time. Denies fever, rhinorrhea, N/V. States that she just feels terrible. Diagnosed with asthma when she was in her 53s.   Rash is located around her neck and has been present for 3 days. Rash itches and has not spread. Bumps have not changed in color. Denies new perfume, lotion, detergent, or soap. Recalls wearing costume jewelry earrings on Wednesday that dangled onto neck. Wore earrings to gym and sweated a lot. Benadryl helped with itching.  NKDA.  Review of Systems  Constitutional: Positive for fatigue. Negative for fever and diaphoresis.  HENT: Negative for congestion, rhinorrhea, sinus pressure, sneezing and sore throat.   Respiratory: Positive for cough, chest tightness, shortness of breath and wheezing. Negative for apnea.   Cardiovascular: Negative for chest pain and palpitations.  Gastrointestinal: Negative for nausea and vomiting.  Skin: Positive for rash.  Neurological: Negative for dizziness and headaches.       Objective:   Physical Exam  Constitutional: She is oriented to person, place, and time. She appears well-developed and well-nourished. No distress.  Blood pressure 134/80, pulse 84, temperature 98.1 F (36.7 C), temperature source Oral, resp. rate 18, height 5\' 5"  (1.651 m), weight 212 lb (96.163 kg), SpO2 97 %.  HENT:  Head: Normocephalic and atraumatic.  Right Ear: External ear normal.  Left Ear: External ear normal.  Eyes: Conjunctivae are normal. Right eye exhibits no  discharge. Left eye exhibits no discharge. No scleral icterus.  Neck: Neck supple.  Cardiovascular: Normal rate, regular rhythm and normal heart sounds.  Exam reveals no gallop and no friction rub.   No murmur heard. Pulmonary/Chest: Effort normal. No apnea and no tachypnea. No respiratory distress. She has no decreased breath sounds. She has wheezes (improved with DuoNeb x2) in the right upper field, the right middle field, the left upper field, the left middle field and the left lower field. She has no rhonchi. She has no rales. She exhibits no tenderness.  Lymphadenopathy:    She has no cervical adenopathy.  Neurological: She is alert and oriented to person, place, and time.  Skin: Skin is warm and dry. Rash (fine hives around neck) noted. She is not diaphoretic.  Psychiatric: She has a normal mood and affect. Her behavior is normal. Judgment and thought content normal.      Assessment & Plan:  1. Asthma with acute exacerbation, unspecified asthma severity Trigger avoidance. Discussed importance of maintenance medication. Discussed difference with respiclick and she would still like to use free trial. Has rx for original albuterol if does not like or unable to use respiclick. - albuterol (PROVENTIL) (2.5 MG/3ML) 0.083% nebulizer solution 2.5 mg; Take 3 mLs (2.5 mg total) by nebulization once. - ipratropium (ATROVENT) nebulizer solution 0.5 mg; Take 2.5 mLs (0.5 mg total) by nebulization once. - methylPREDNISolone acetate (DEPO-MEDROL) injection 80 mg; Inject 1 mL (80 mg total) into the muscle once. - budesonide-formoterol (SYMBICORT) 80-4.5 MCG/ACT inhaler; INHALE 2 PUFFS INTO THE LUNGS 2 (TWO) TIMES DAILY.  Dispense: 10.2 Inhaler; Refill: 1 - Albuterol Sulfate (PROAIR RESPICLICK) 481 (90 BASE) MCG/ACT AEPB; Inhale 2 puffs into the lungs every 4 (four) hours as needed.  Dispense: 1 each; Refill: 3  2. Hives - hydrOXYzine (ATARAX/VISTARIL) 25 MG tablet; Take 0.5-1 tablets (12.5-25 mg total)  by mouth every 8 (eight) hours as needed for itching.  Dispense: 30 tablet; Refill: 0 - methylPREDNISolone acetate (DEPO-MEDROL) injection 80 mg; Inject 1 mL (80 mg total) into the muscle once.   Alveta Heimlich PA-C  [facility] 05/25/2015 9:24 AM

## 2015-05-25 NOTE — Patient Instructions (Signed)

## 2015-05-30 ENCOUNTER — Other Ambulatory Visit: Payer: Self-pay | Admitting: Family Medicine

## 2015-06-01 NOTE — Telephone Encounter (Signed)
Pt has history of ezcema. Can we refill? This has not been discussed recently.

## 2015-07-25 ENCOUNTER — Other Ambulatory Visit: Payer: Self-pay | Admitting: Family Medicine

## 2015-07-25 ENCOUNTER — Ambulatory Visit (INDEPENDENT_AMBULATORY_CARE_PROVIDER_SITE_OTHER): Payer: Managed Care, Other (non HMO) | Admitting: Family Medicine

## 2015-07-25 VITALS — BP 142/84 | HR 69 | Temp 98.1°F | Resp 16 | Ht 68.0 in | Wt 214.4 lb

## 2015-07-25 DIAGNOSIS — F430 Acute stress reaction: Secondary | ICD-10-CM

## 2015-07-25 DIAGNOSIS — J4531 Mild persistent asthma with (acute) exacerbation: Secondary | ICD-10-CM

## 2015-07-25 DIAGNOSIS — J45901 Unspecified asthma with (acute) exacerbation: Secondary | ICD-10-CM | POA: Diagnosis not present

## 2015-07-25 DIAGNOSIS — J0100 Acute maxillary sinusitis, unspecified: Secondary | ICD-10-CM

## 2015-07-25 DIAGNOSIS — G4700 Insomnia, unspecified: Secondary | ICD-10-CM | POA: Diagnosis not present

## 2015-07-25 MED ORDER — ALBUTEROL SULFATE (2.5 MG/3ML) 0.083% IN NEBU: 2.5000 mg | INHALATION_SOLUTION | RESPIRATORY_TRACT | Status: DC | PRN

## 2015-07-25 MED ORDER — ALBUTEROL SULFATE 108 (90 BASE) MCG/ACT IN AEPB: 2.0000 | INHALATION_SPRAY | RESPIRATORY_TRACT | Status: DC | PRN

## 2015-07-25 MED ORDER — PREDNISONE 20 MG PO TABS: ORAL_TABLET | ORAL | Status: DC

## 2015-07-25 MED ORDER — TRAZODONE HCL 50 MG PO TABS: 50.0000 mg | ORAL_TABLET | Freq: Every evening | ORAL | Status: DC | PRN

## 2015-07-25 MED ORDER — AMOXICILLIN-POT CLAVULANATE 875-125 MG PO TABS: 1.0000 | ORAL_TABLET | Freq: Two times a day (BID) | ORAL | Status: DC

## 2015-07-25 NOTE — Patient Instructions (Signed)
1. Increase Proair rescue inhaler to 2 puffs four times daily for the next week and then as needed.   Insomnia Insomnia is frequent trouble falling and/or staying asleep. Insomnia can be a long term problem or a short term problem. Both are common. Insomnia can be a short term problem when the wakefulness is related to a certain stress or worry. Long term insomnia is often related to ongoing stress during waking hours and/or poor sleeping habits. Overtime, sleep deprivation itself can make the problem worse. Every little thing feels more severe because you are overtired and your ability to cope is decreased. CAUSES   Stress, anxiety, and depression.  Poor sleeping habits.  Distractions such as TV in the bedroom.  Naps close to bedtime.  Engaging in emotionally charged conversations before bed.  Technical reading before sleep.  Alcohol and other sedatives. They may make the problem worse. They can hurt normal sleep patterns and normal dream activity.  Stimulants such as caffeine for several hours prior to bedtime.  Pain syndromes and shortness of breath can cause insomnia.  Exercise late at night.  Changing time zones may cause sleeping problems (jet lag). It is sometimes helpful to have someone observe your sleeping patterns. They should look for periods of not breathing during the night (sleep apnea). They should also look to see how long those periods last. If you live alone or observers are uncertain, you can also be observed at a sleep clinic where your sleep patterns will be professionally monitored. Sleep apnea requires a checkup and treatment. Give your caregivers your medical history. Give your caregivers observations your family has made about your sleep.  SYMPTOMS   Not feeling rested in the morning.  Anxiety and restlessness at bedtime.  Difficulty falling and staying asleep. TREATMENT   Your caregiver may prescribe treatment for an underlying medical disorders. Your  caregiver can give advice or help if you are using alcohol or other drugs for self-medication. Treatment of underlying problems will usually eliminate insomnia problems.  Medications can be prescribed for short time use. They are generally not recommended for lengthy use.  Over-the-counter sleep medicines are not recommended for lengthy use. They can be habit forming.  You can promote easier sleeping by making lifestyle changes such as:  Using relaxation techniques that help with breathing and reduce muscle tension.  Exercising earlier in the day.  Changing your diet and the time of your last meal. No night time snacks.  Establish a regular time to go to bed.  Counseling can help with stressful problems and worry.  Soothing music and white noise may be helpful if there are background noises you cannot remove.  Stop tedious detailed work at least one hour before bedtime. HOME CARE INSTRUCTIONS   Keep a diary. Inform your caregiver about your progress. This includes any medication side effects. See your caregiver regularly. Take note of:  Times when you are asleep.  Times when you are awake during the night.  The quality of your sleep.  How you feel the next day. This information will help your caregiver care for you.  Get out of bed if you are still awake after 15 minutes. Read or do some quiet activity. Keep the lights down. Wait until you feel sleepy and go back to bed.  Keep regular sleeping and waking hours. Avoid naps.  Exercise regularly.  Avoid distractions at bedtime. Distractions include watching television or engaging in any intense or detailed activity like attempting to balance the household  checkbook.  Develop a bedtime ritual. Keep a familiar routine of bathing, brushing your teeth, climbing into bed at the same time each night, listening to soothing music. Routines increase the success of falling to sleep faster.  Use relaxation techniques. This can be using  breathing and muscle tension release routines. It can also include visualizing peaceful scenes. You can also help control troubling or intruding thoughts by keeping your mind occupied with boring or repetitive thoughts like the old concept of counting sheep. You can make it more creative like imagining planting one beautiful flower after another in your backyard garden.  During your day, work to eliminate stress. When this is not possible use some of the previous suggestions to help reduce the anxiety that accompanies stressful situations. MAKE SURE YOU:   Understand these instructions.  Will watch your condition.  Will get help right away if you are not doing well or get worse. Document Released: 11/27/2000 Document Revised: 02/22/2012 Document Reviewed: 12/28/2007 [facility] Patient Information 2015 [place], [place]. This information is not intended to replace advice given to you by your health care provider. Make sure you discuss any questions you have with your health care provider.

## 2015-07-25 NOTE — Progress Notes (Signed)
Patient ID: Nicole Reyes, female   DOB: 11/23/1956, 59 y.o.   MRN: [id number]    Subjective:  This chart was scribed for Reginia Forts, MD by [facility], medical scribe at [facility].The patient was seen in exam room 05 and the patient's care was started at 5:44 PM.   Patient ID: Nicole Reyes, female    DOB: [birth year], 59 y.o.   MRN: [id number]  07/25/2015  Nasal Congestion; Cough; Fatigue; and Depression  HPI HPI Comments: Nicole Reyes is a 59 y.o. female who presents to [facility] complaining of  Sinus pressure, chest congestion and a cough ongoing for three weeks. One episode of a headache two weeks ago. One episode of chills two days ago. She does have a history of asthma but has never been hospitalized due to her asthma. She has been taking symbicort twice a day; + some shortness of breath with activity, tried mucinex DM. Also using her resuce inhaler twice a day. She has been able to work. She denies fever, sore throat, ear pain, itchy eyes, eye discharge, rhinorrhea, nausea, vomiting, and diarrhea.   Depression: Sister has a history of depression. Stressed due to her job at [employer], been there for 28 years. Stress is building up. Personal life is good and doing things for enjoyment like the gym and church. Not able to sleep for two months, tried benadryl with a little relief. Sleeping three hours a night then works 6 AM to 4 PM. Tried Ambien in the past which did help.  Review of Systems  Constitutional: Negative for fever, chills, diaphoresis and fatigue.  HENT: Positive for congestion and sinus pressure. Negative for ear pain, postnasal drip, rhinorrhea, sore throat and trouble swallowing.   Eyes: Negative for discharge, redness and itching.  Respiratory: Positive for cough and wheezing. Negative for shortness of breath.   Cardiovascular: Negative for chest pain, palpitations and leg swelling.  Gastrointestinal:  Negative for nausea, vomiting, abdominal pain, diarrhea and constipation.  Neurological: Negative for dizziness and headaches.  Psychiatric/Behavioral: Positive for sleep disturbance and dysphoric mood. Negative for suicidal ideas and self-injury. The patient is not nervous/anxious.     Past Medical History  Diagnosis Date  . Asthma   . Anxiety   . Hypertension   . Allergy    Past Surgical History  Procedure Laterality Date  . Laparoscopy      REMOVED SCARE TISSUE FROM OVARIES  . Dilation and curettage of uterus    . Colonoscopy&endoscopy  1980's    PT UNSURE PLACE&MD WHO DID EXAMS=NORMAL  . Colonoscopy  06/29/2011    Normal   No Known Allergies Social History   Social History  . Marital Status: Single    Spouse Name: n/a  . Number of Children: 0  . Years of Education: N/A   Occupational History  . employed Museum/gallery curator   Social History Main Topics  . Smoking status: Never Smoker   . Smokeless tobacco: Never Used  . Alcohol Use: No  . Drug Use: No  . Sexual Activity: Yes    Birth Control/ Protection: Post-menopausal   Other Topics Concern  . 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      Tobacco: none      Alcohol: none  Exercise:  Four days per week.        Objective:    BP 142/84 mmHg  Pulse 69  Temp(Src) 98.1 F (36.7 C) (Oral)  Resp 16  Ht 5\' 8"  (1.727 m)  Wt 214 lb 6.4 oz (97.251 kg)  BMI 32.61 kg/m2  SpO2 98% Physical Exam  Constitutional: She is oriented to person, place, and time. She appears well-developed and well-nourished. No distress.  HENT:  Head: Normocephalic and atraumatic.  Right Ear: External ear normal.  Left Ear: External ear normal.  Nose: Mucosal edema and rhinorrhea present. Right sinus exhibits no maxillary sinus tenderness and no frontal sinus tenderness. Left sinus exhibits no maxillary sinus  tenderness and no frontal sinus tenderness.  Mouth/Throat: Uvula is midline and mucous membranes are normal. Posterior oropharyngeal erythema present.  Nares and throat are red and swollen.   Eyes: Conjunctivae and EOM are normal. Pupils are equal, round, and reactive to light.  Neck: Normal range of motion. Neck supple. Carotid bruit is not present. No thyromegaly present.  Cardiovascular: Normal rate, regular rhythm, normal heart sounds and intact distal pulses.  Exam reveals no gallop and no friction rub.   No murmur heard. Pulmonary/Chest: Effort normal. No respiratory distress. She has wheezes. She has no rales.  Wheezing on forced expiration throughout.  Lymphadenopathy:    She has no cervical adenopathy.  Neurological: She is alert and oriented to person, place, and time. No cranial nerve deficit.  Skin: Skin is warm and dry. No rash noted. She is not diaphoretic. No erythema. No pallor.  Psychiatric: She has a normal mood and affect. Her behavior is normal.   Results for orders placed or performed in visit on 05/24/15  HM MAMMOGRAPHY  Result Value Ref Range   HM Mammogram no mammographic evidence of malignancy       PEAK FLOWS:  250, 260, 260 (predicted 412)   Assessment & Plan:   1. Acute maxillary sinusitis, recurrence not specified   2. Asthma with acute exacerbation, mild persistent   3. Insomnia   4. Stress reaction   5. Asthma with acute exacerbation, unspecified asthma severity     1. Acute maxillary sinusitis: New. Rx for Augmentin provided.  Continue Mucinex DM. 2.  Asthma exacerbation: New.  Rx for Prednisone provided.  Use Albuterol qid scheduled for one week and then PRN.  Continue Symbicort.   3. Insomnia: New. Rx for Trazodone provided; suffered with amnesia with Ambien thus prefer to avoid reuse.  4.  Stress reaction:  New.  Works stressors at this current time.  Desires treating insomnia and then reevaluating depressive symptoms.     Meds ordered this  encounter  Medications  . traZODone (DESYREL) 50 MG tablet    Sig: Take 1-2 tablets (50-100 mg total) by mouth at bedtime as needed for sleep.    Dispense:  45 tablet    Refill:  3  . amoxicillin-clavulanate (AUGMENTIN) 875-125 MG per tablet    Sig: Take 1 tablet by mouth 2 (two) times daily.    Dispense:  20 tablet    Refill:  0  . Albuterol Sulfate (PROAIR RESPICLICK) 409 (90 BASE) MCG/ACT AEPB    Sig: Inhale 2 puffs into the lungs every 4 (four) hours as needed.    Dispense:  1 each    Refill:  3  . predniSONE (DELTASONE) 20 MG tablet    Sig: Two tablets daily x 5 days then one tablet daily x 5 days    Dispense:  15 tablet    Refill:  0  . albuterol (PROVENTIL) (2.5 MG/3ML) 0.083% nebulizer solution    Sig: Take 3 mLs (2.5 mg total) by nebulization every 4 (four) hours as needed for wheezing.    Dispense:  75 mL    Refill:  1    No Follow-up on file.  I personally performed the services described in this documentation, which was scribed in my presence. The recorded information has been reviewed and considered.  Deleah Tison Elayne Guerin, M.D. [facility] [address] [place], [place]  [phone] phone [phone] fax

## 2015-07-27 MED ORDER — AMLODIPINE BESYLATE 10 MG PO TABS: 10.0000 mg | ORAL_TABLET | Freq: Every day | ORAL | Status: DC

## 2015-07-27 NOTE — Addendum Note (Signed)
Addended by: Kem Boroughs D on: 07/27/2015 10:41 AM   Modules accepted: Orders

## 2015-08-01 IMAGING — CR DG KNEE COMPLETE 4+V*L*
4 series · 4 of 4 positions shown · non-contrast
Comparison: 04/26/2010

CLINICAL DATA: Left knee pain.  Remote history of arthroscopy.

EXAM:
LEFT KNEE - COMPLETE 4+ VIEW

[AP]
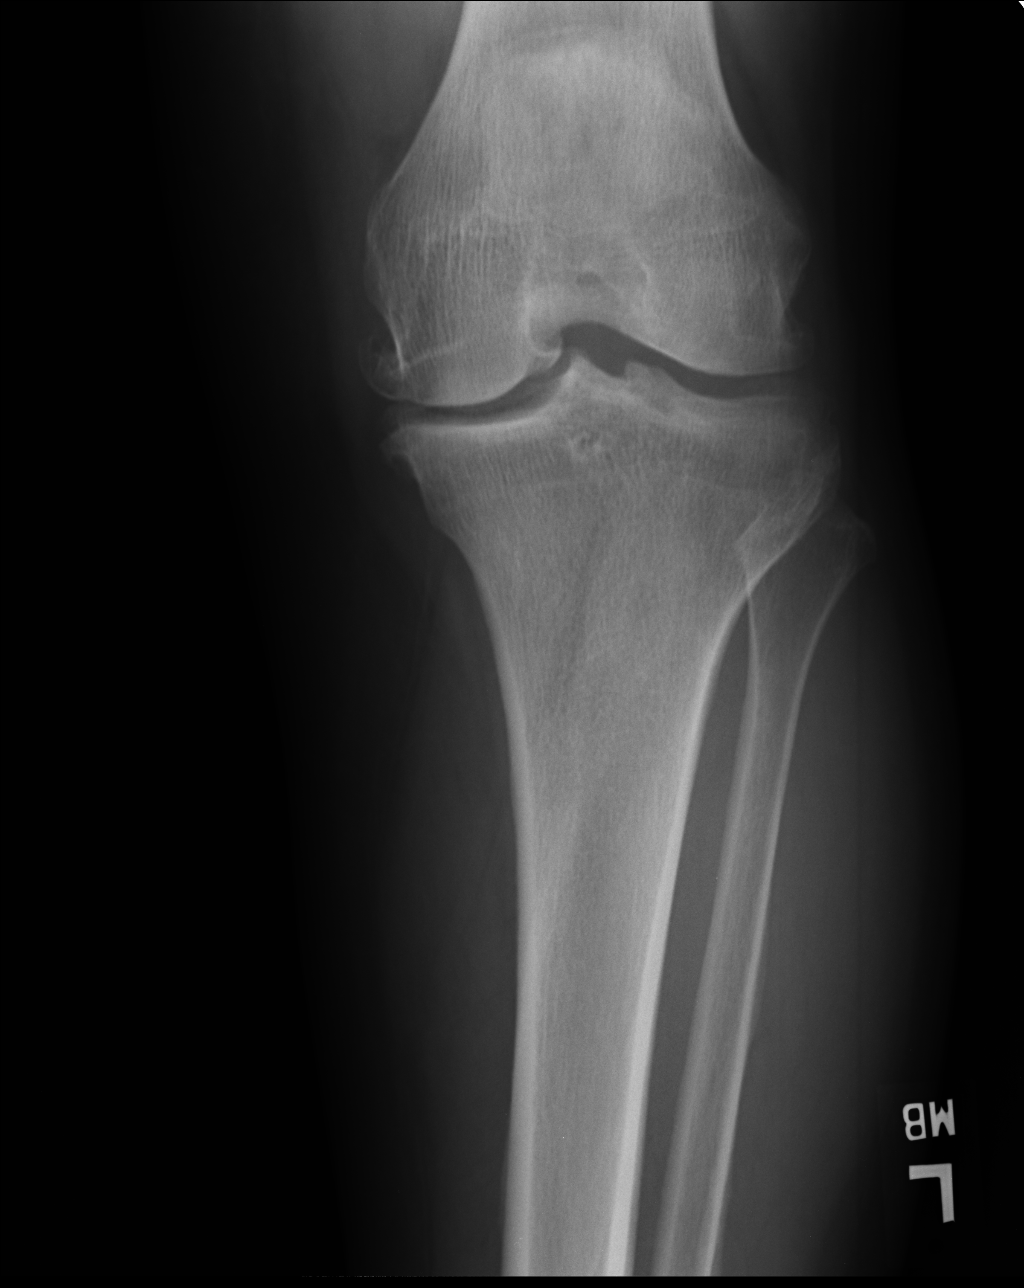

[ap int rot]
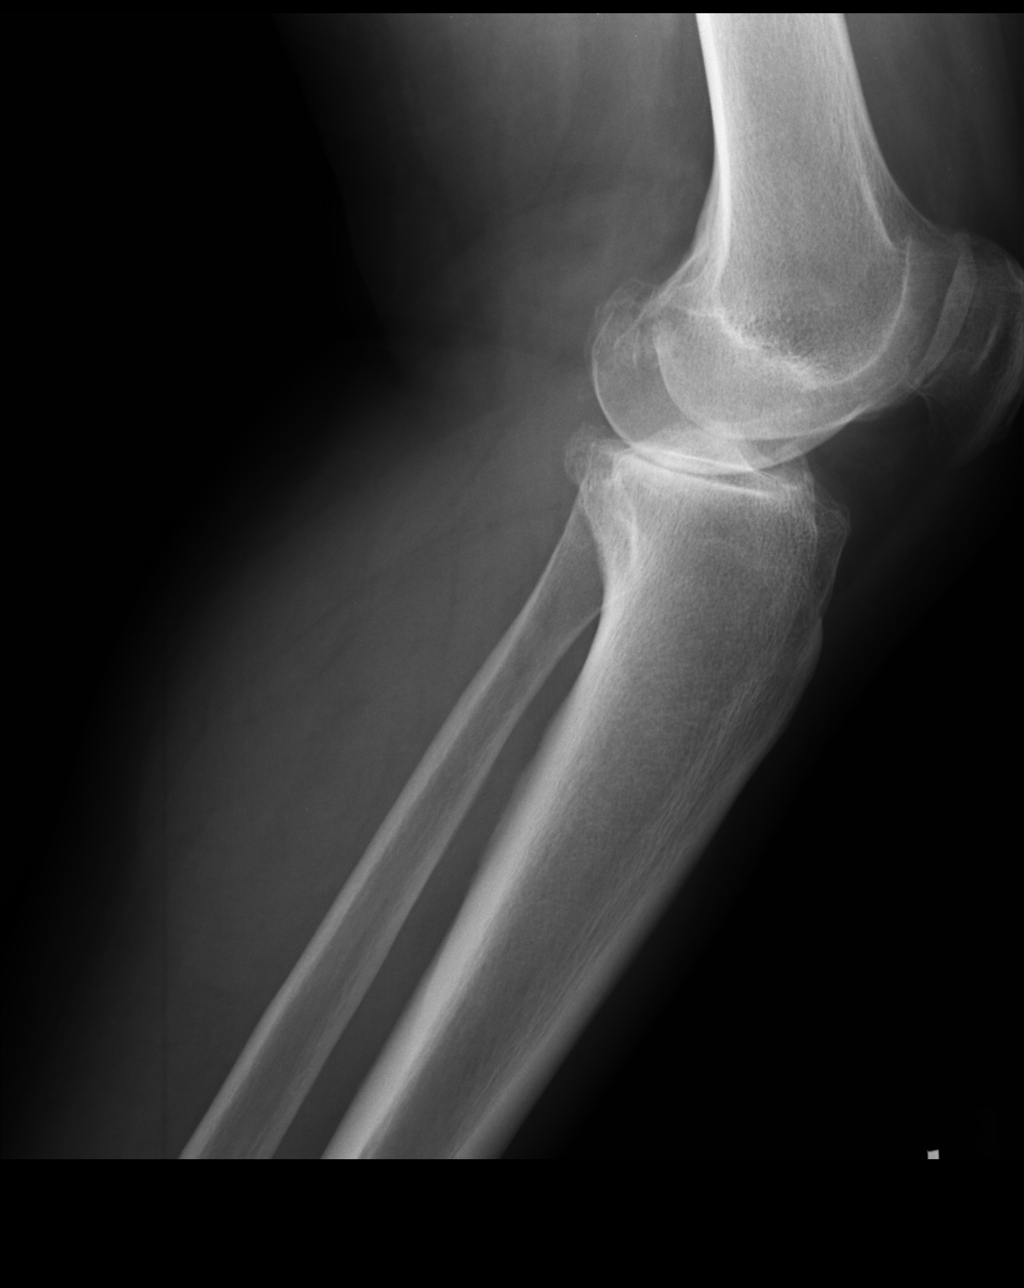

[lateral (1 of 2)]
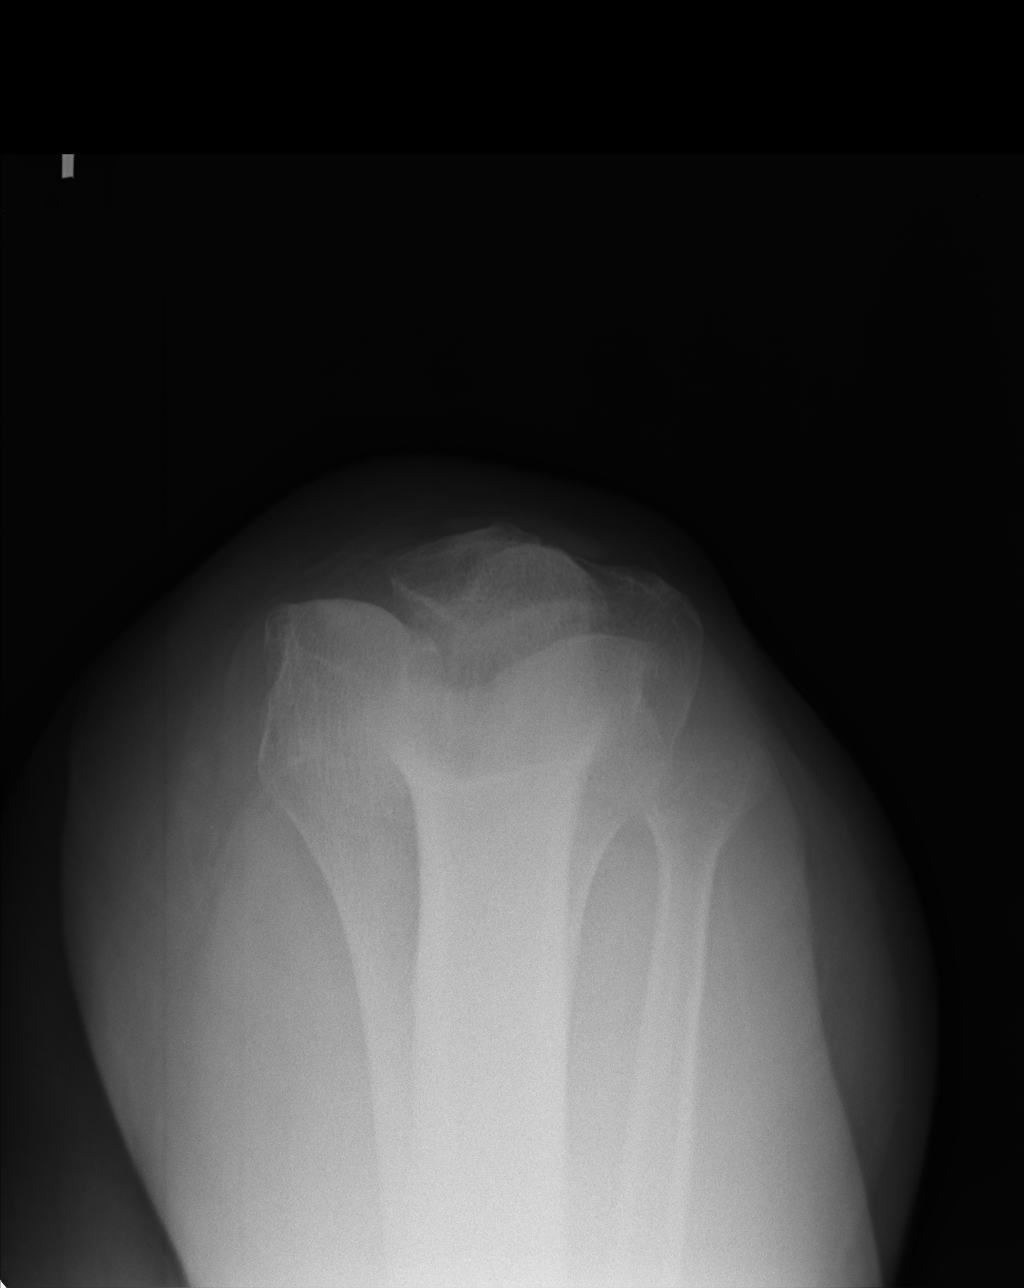

[lateral (2 of 2)]
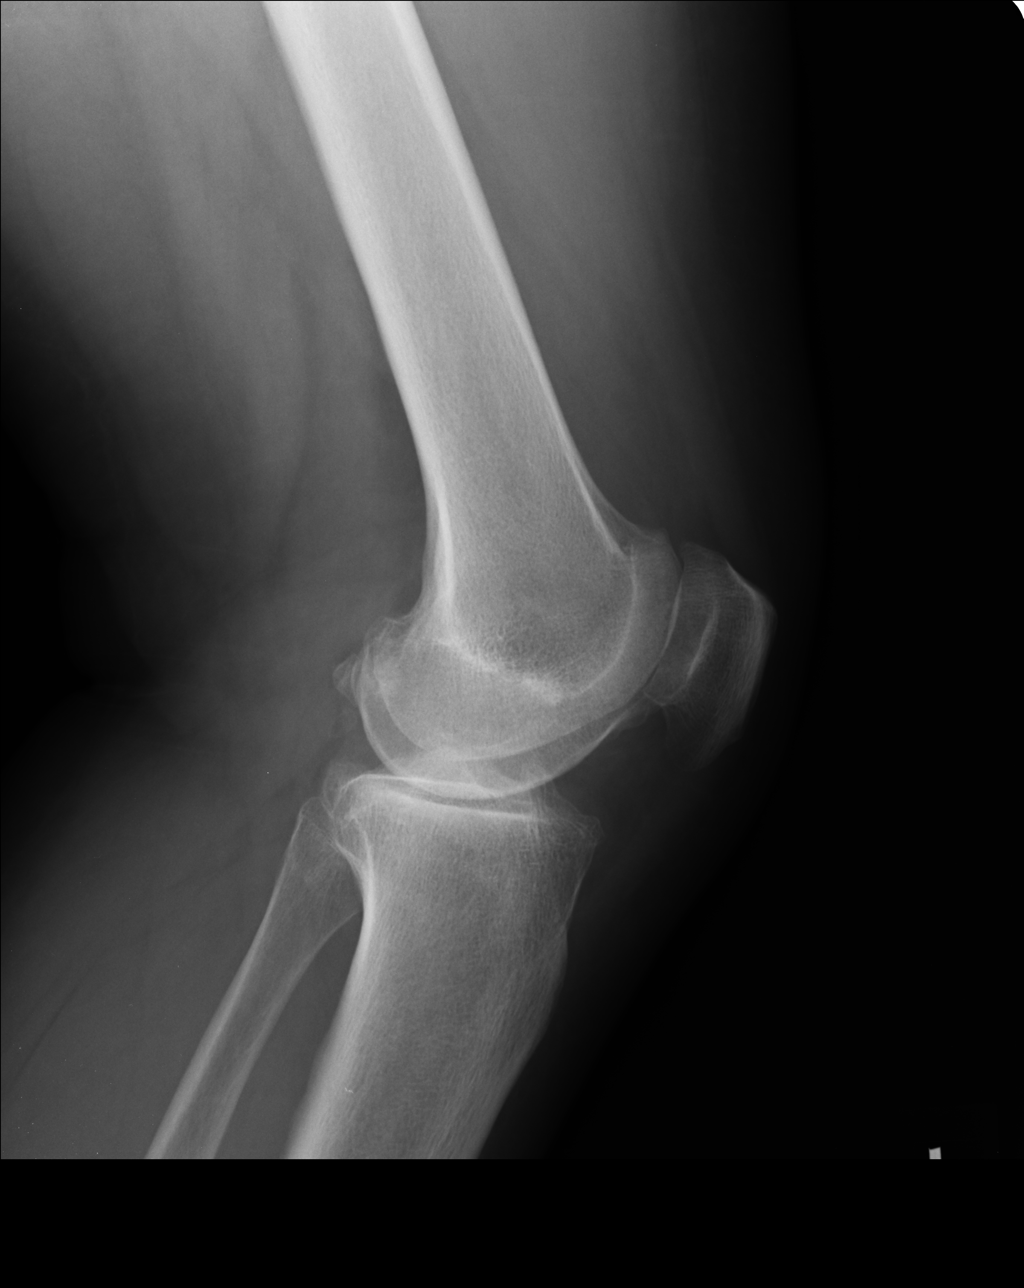

[4 of 4 positions shown; findings below may reference images not displayed]

FINDINGS: Prominent medial compartmental articular space narrowing with mild
lateral compartmental articular space narrowing. Degenerative
patellofemoral arthropathy with spurring and mild loss of articular
space, difficult to quantify given the somewhat off-axis sunrise
view. Marginal spurring in both the medial and lateral compartments.
No definite knee effusion.
IMPRESSION: 1. Osteoarthritis most severely involving the medial compartment,
progressive.

## 2015-08-07 ENCOUNTER — Other Ambulatory Visit: Payer: Self-pay | Admitting: Physician Assistant

## 2015-09-01 ENCOUNTER — Other Ambulatory Visit: Payer: Self-pay | Admitting: Family Medicine

## 2015-09-15 ENCOUNTER — Ambulatory Visit (INDEPENDENT_AMBULATORY_CARE_PROVIDER_SITE_OTHER): Payer: Managed Care, Other (non HMO) | Admitting: Family Medicine

## 2015-09-15 ENCOUNTER — Ambulatory Visit (INDEPENDENT_AMBULATORY_CARE_PROVIDER_SITE_OTHER): Payer: Managed Care, Other (non HMO)

## 2015-09-15 VITALS — BP 132/80 | HR 64 | Temp 98.0°F | Resp 16 | Ht 68.0 in | Wt 211.0 lb

## 2015-09-15 DIAGNOSIS — Z23 Encounter for immunization: Secondary | ICD-10-CM | POA: Diagnosis not present

## 2015-09-15 DIAGNOSIS — G5603 Carpal tunnel syndrome, bilateral upper limbs: Secondary | ICD-10-CM

## 2015-09-15 DIAGNOSIS — M25552 Pain in left hip: Secondary | ICD-10-CM

## 2015-09-15 MED ORDER — OMEPRAZOLE 40 MG PO CPDR: 40.0000 mg | DELAYED_RELEASE_CAPSULE | Freq: Every day | ORAL | Status: DC

## 2015-09-15 MED ORDER — PREDNISONE 20 MG PO TABS: ORAL_TABLET | ORAL | Status: DC

## 2015-09-15 MED ORDER — TRAMADOL HCL 50 MG PO TABS: 50.0000 mg | ORAL_TABLET | Freq: Three times a day (TID) | ORAL | Status: DC | PRN

## 2015-09-15 NOTE — Patient Instructions (Signed)
Carpal Tunnel Syndrome °Carpal tunnel syndrome is a disorder of the nervous system in the wrist that causes pain, hand weakness, and/or loss of feeling. Carpal tunnel syndrome is caused by the compression, stretching, or irritation of the median nerve at the wrist joint. Athletes who experience carpal tunnel syndrome may notice a decrease in their performance to the condition, especially for sports that require strong hand or wrist action.  °SYMPTOMS  °· Tingling, numbness, or burning pain in the hand or fingers. °· Inability to sleep due to pain in the hand. °· Sharp pains that shoot from the wrist up the arm or to the fingers, especially at night. °· Morning stiffness or cramping of the hand. °· Thumb weakness, resulting in difficulty holding objects or making a fist. °· Shiny, dry skin on the hand. °· Reduced performance in any sport requiring a strong grip. °CAUSES  °· Median nerve damage at the wrist is caused by pressure due to swelling, inflammation, or scarred tissue. °· Sources of pressure include: °¨ Repetitive gripping or squeezing that causes inflammation of the tendon sheaths. °¨ Scarring or shortening of the ligament that covers the median nerve. °¨ Traumatic injury to the wrist or forearm such as fracture, sprain, or dislocation. °¨ Prolonged hyperextension (wrist bent backward) or hyperflexion (wrist bent downward) of the wrist. °RISK INCREASES WITH: °· Diabetes mellitus. °· Menopause or amenorrhea. °· Rheumatoid arthritis. °· Raynaud disease. °· Pregnancy. °· Gout. °· Kidney disease. °· Ganglion cyst. °· Repetitive hand or wrist action. °· Hypothyroidism (underactive thyroid gland). °· Repetitive jolting or shaking of the hands or wrist. °· Prolonged forceful weight-bearing on the hands. °PREVENTION °· Bracing the hand and wrist straight during activities that involve repetitive grasping. °· For activities that require prolonged extension of the wrist (bending towards the top of the forearm)  periodically change the position of your wrists. °· Learn and use proper technique in activities that result in the wrist position in neutral to slight extension. °· Avoid bending the wrist into full extension or flexion (up or down). °· Keep the wrist in a straight (neutral) position. To keep the wrist in this position, wear a splint. °· Avoid repetitive hand and wrist motions. °· When possible avoid prolonged grasping of items (steering wheel of a car, a pen, a vacuum cleaner, or a rake). °· Loosen your grip for activities that require prolonged grasping of items. °· Place keyboards and writing surfaces at the correct height as to decrease strain on the wrist and hand. °· Alternate work tasks to avoid prolonged wrist flexion. °· Avoid pinching activities (needlework and writing) as they may irritate your carpal tunnel syndrome. °· If these activities are necessary, complete them for shorter periods of time. °· When writing, use a felt tip or rollerball pen and/or build up the grip on a pen to decrease the forces required for writing. °PROGNOSIS  °Carpal tunnel syndrome is usually curable with appropriate conservative treatment and sometimes resolves spontaneously. For some cases, surgery is necessary, especially if muscle wasting or nerve changes have developed.  °RELATED COMPLICATIONS  °· Permanent numbness and a weak thumb or fingers in the affected hand. °· Permanent paralysis of a portion of the hand and fingers. °TREATMENT  °Treatment initially consists of stopping activities that aggravate the symptoms as well as medication and ice to reduce inflammation. A wrist splint is often recommended for wear during activities of repetitive motion as well as at night. It is also important to learn and use proper technique when   performing activities that typically cause pain. On occasion, a corticosteroid injection may be given. If symptoms persist despite conservative treatment, surgery may be an option. Surgical  techniques free the pinched or compressed nerve. Carpal tunnel surgery is usually performed on an outpatient basis, meaning you go home the same day as surgery. These procedures provide almost complete relief of all symptoms in 95% of patients. Expect at least 2 weeks for healing after surgery. For cases that are the result of repeated jolting or shaking of the hand or wrist or prolonged hyperextension, surgery is not usually recommended because stretching of the median nerve, not compression, is usually the cause of carpal tunnel syndrome in these cases. MEDICATION   If pain medication is necessary, nonsteroidal anti-inflammatory medications, such as aspirin and ibuprofen, or other minor pain relievers, such as acetaminophen, are often recommended.  Do not take pain medication for 7 days before surgery.  Prescription pain relievers are usually only prescribed after surgery. Use only as directed and only as much as you need.  Corticosteroid injections may be given to reduce inflammation. However, they are not always recommended.  Vitamin B6 (pyridoxine) may reduce symptoms; use only if prescribed for your disorder. SEEK MEDICAL CARE IF:   Symptoms get worse or do not improve in 2 weeks despite treatment.  You also have a current or recent history of neck or shoulder injury that has resulted in pain or tingling elsewhere in your arm. Document Released: 11/30/2005 Document Revised: 04/16/2014 Document Reviewed: 03/14/2009 [facility] Patient Information 2015 [place], [place]. This information is not intended to replace advice given to you by your health care provider. Make sure you discuss any questions you have with your health care provider.  Piriformis Syndrome with Rehab Piriformis syndrome is a condition the affects the nervous system in the area of the hip, and is characterized by pain and possibly a loss of feeling in the backside (posterior) thigh that may extend down the entire length of the  leg. The symptoms are caused by an increase in pressure on the sciatic nerve by the piriformis muscle, which is on the back of the hip and is responsible for externally rotating the hip. The sciatic nerve and its branches connect to much of the leg. Normally the sciatic nerve runs between the piriformis muscle and other muscles. However, in certain individuals the nerve runs through the muscle, which causes an increase in pressure on the nerve and results in the symptoms of piriformis syndrome. SYMPTOMS   Pain, tingling, numbness, or burning in the back of the thigh that may also extend down the entire leg.  Occasionally, tenderness in the buttock.  Loss of function of the leg.  Pain that worsens when using the piriformis muscle (running, jumping, or stairs).  Pain that increases with prolonged sitting.  Pain that is lessened by lying flat on the back. CAUSES   Piriformis syndrome is the result of an increase in pressure placed on the sciatic nerve. Oftentimes, piriformis syndrome is an overuse injury.  Stress placed on the nerve from a sudden increase in the intensity, frequency, or duration of training.  Compensation of other extremity injuries. RISK INCREASES WITH:  Sports that involve the piriformis muscle (running, walking, or jumping).  You are born with (congenital) a defect in which the sciatic nerve passes through the muscle. PREVENTION  Warm up and stretch properly before activity.  Allow for adequate recovery between workouts.  Maintain physical fitness:  Strength, flexibility, and endurance.  Cardiovascular fitness. PROGNOSIS  If treated properly, the symptoms of piriformis syndrome usually resolve in 2 to 6 weeks. RELATED COMPLICATIONS   Persistent and possibly permanent pain and numbness in the lower extremity.  Weakness of the extremity that may progress to disability and inability to compete. TREATMENT  The most effective treatment for piriformis syndrome  is rest from any activities that aggravate the symptoms. Ice and pain medication may help reduce pain and inflammation. The use of strengthening and stretching exercises may help reduce pain with activity. These exercises may be performed at home or with a therapist. A referral to a therapist may be given for further evaluation and treatment, such as ultrasound. Corticosteroid injections may be given to reduce inflammation that is causing pressure to be placed on the sciatic nerve. If nonsurgical (conservative) treatment is unsuccessful, then surgery may be recommended.  MEDICATION   If pain medication is necessary, then nonsteroidal anti-inflammatory medications, such as aspirin and ibuprofen, or other minor pain relievers, such as acetaminophen, are often recommended.  Do not take pain medication for 7 days before surgery.  Prescription pain relievers may be given if deemed necessary by your caregiver. Use only as directed and only as much as you need.  Corticosteroid injections may be given by your caregiver. These injections should be reserved for the most serious cases, because they may only be given a certain number of times. HEAT AND COLD:   Cold treatment (icing) relieves pain and reduces inflammation. Cold treatment should be applied for 10 to 15 minutes every 2 to 3 hours for inflammation and pain and immediately after any activity that aggravates your symptoms. Use ice packs or massage the area with a piece of ice (ice massage).  Heat treatment may be used prior to performing the stretching and strengthening activities prescribed by your caregiver, physical therapist, or athletic trainer. Use a heat pack or soak the injury in warm water. SEEK IMMEDIATE MEDICAL CARE IF:  Treatment seems to offer no benefit, or the condition worsens.  Any medications produce adverse side effects. EXERCISES RANGE OF MOTION (ROM) AND STRETCHING EXERCISES - Piriformis Syndrome These exercises may help you  when beginning to rehabilitate your injury. Your symptoms may resolve with or without further involvement from your physician, physical therapist, or athletic trainer. While completing these exercises, remember:   Restoring tissue flexibility helps normal motion to return to the joints. This allows healthier, less painful movement and activity.  An effective stretch should be held for at least 30 seconds.  A stretch should never be painful. You should only feel a gentle lengthening or release in the stretched tissue. STRETCH - Hip Rotators  Lie on your back on a firm surface. Grasp your right / left knee with your right / left hand and your ankle with your opposite hand.  Keeping your hips and shoulders firmly planted, gently pull your right / left knee and rotate your lower leg toward your opposite shoulder until you feel a stretch in your buttocks.  Hold this stretch for __________ seconds. Repeat this stretch __________ times. Complete this stretch __________ times per day. STRETCH - Iliotibial Band  On the floor or bed, lie on your side so your right / left leg is on top. Bend your knee and grab your ankle.  Slowly bring your knee back so that your thigh is in line with your trunk. Keep your heel at your buttocks and gently arch your back so your head, shoulders, and hips line up.  Slowly lower your leg  so that your knee approaches the floor/bed until you feel a gentle stretch on the outside of your right / left thigh. If you do not feel a stretch and your knee will not fall farther, place the heel of your opposite foot on top of your knee and pull your thigh down farther.  Hold this stretch for __________ seconds. Repeat __________ times. Complete __________ times per day. STRENGTHENING EXERCISES - Piriformis Syndrome  These are some of the caregiver again or until your symptoms are resolved. Remember:   Strong muscles with good endurance tolerate stress better.  Do the exercises  as initially prescribed by your caregiver. Progress slowly with each exercise, gradually increasing the number of repetitions and weight used under their guidance. STRENGTH - Hip Abductors, Straight Leg Raises Be aware of your form throughout the entire exercise so that you exercise the correct muscles. Sloppy form means that you are not strengthening the correct muscles.  Lie on your side so that your head, shoulders, knee, and hip line up. You may bend your lower knee to help maintain your balance. Your right / left leg should be on top.  Roll your hips slightly forward, so that your hips are stacked directly over each other and your right / left knee is facing forward.  Lift your top leg up 4-6 inches, leading with your heel. Be sure that your foot does not drift forward or that your knee does not roll toward the ceiling.  Hold this position for __________ seconds. You should feel the muscles in your outer hip lifting (you may not notice this until your leg begins to tire).  Slowly lower your leg to the starting position. Allow the muscles to fully relax before beginning the next repetition. Repeat __________ times. Complete this exercise __________ times per day.  STRENGTH - Hip Abductors, Quadruped  On a firm, lightly padded surface, position yourself on your hands and knees. Your hands should be directly below your shoulders and your knees should be directly below your hips.  Keeping your right / left knee bent, lift your leg out to the side. Keep your legs level and in line with your shoulders.  Position yourself on your hands and knees.  Hold for __________ seconds.  Keeping your trunk steady and your hips level, slowly lower your leg to the starting position. Repeat __________ times. Complete this exercise __________ times per day.  STRENGTH - Hip Abductors, Standing  Tie one end of a rubber exercise band/tubing to a secure surface (table, pole) and tie a loop at the other  end.  Place the loop around your right / left ankle. Keeping your ankle with the band directly opposite of the secured end, step away until there is tension in the tube/band.  Hold onto a chair as needed for balance.  Keeping your back upright, your shoulders over your hips, and your toes pointing forward, lift your right / left leg out to your side. Be sure to lift your leg with your hip muscles. Do not "throw" your leg or tip your body to lift your leg.  Slowly and with control, return to the starting position. Repeat exercise __________ times. Complete this exercise __________ times per day.  Document Released: 11/30/2005 Document Revised: 04/16/2014 Document Reviewed: 03/14/2009 [facility] Patient Information 2015 [place], [place]. This information is not intended to replace advice given to you by your health care provider. Make sure you discuss any questions you have with your health care provider.

## 2015-09-15 NOTE — Progress Notes (Addendum)
Subjective:  This chart was scribed for Delman Cheadle, MD by [facility], medical scribe at [facility].The patient was seen in exam room 08 and the patient's care was started at 12:19 PM.   Patient ID: Nicole Reyes, female    DOB: July 24, 1956, 59 y.o.   MRN: [id number] Chief Complaint  Patient presents with  . Hip Pain    left side/ x 2 weeks  . Hand Pain    Pt. states she has carpek tunnel/ 2 weeks  . Immunizations    flu shot   HPI  Seen 6 months ago, diagnosed with carpal tunnel by Dr. Lorelei Pont. Given bilateral wrist splints Tramadol as needed, mobic daily and referred to orthopedist for severe osteoarthritis in her knee. According to the pt she was given a cortisone shot in her left knee which did help. Wears wrist splints nightly but does not wear during the day due to work. She has a lot of fine grasping maneuvers at work. No weakness with her grasp. Taking aleve and Advil. Mobic has helped but gave her heartburn. Pain goes up bilateral arms left worse than right up to the forearms. Left sided hip pain started two weeks ago. Developed sharp pain preventing her from walking. Sitting improved her pain. Nothing improves her pain.  Past Medical History  Diagnosis Date  . Asthma   . Anxiety   . Hypertension   . Allergy    Current Outpatient Prescriptions on File Prior to Visit  Medication Sig Dispense Refill  . albuterol (PROVENTIL HFA;VENTOLIN HFA) 108 (90 BASE) MCG/ACT inhaler Inhale 2 puffs into the lungs every 6 (six) hours as needed for wheezing. 3 Inhaler 3  . albuterol (PROVENTIL) (2.5 MG/3ML) 0.083% nebulizer solution Take 3 mLs (2.5 mg total) by nebulization every 4 (four) hours as needed for wheezing. 75 mL 1  . Albuterol Sulfate (PROAIR RESPICLICK) 357 (90 BASE) MCG/ACT AEPB Inhale 2 puffs into the lungs every 4 (four) hours as needed. 1 each 3  . amLODipine (NORVASC) 10 MG tablet Take 1 tablet (10 mg total) by mouth daily. PATIENT NEEDS BLOOD  PRESSURE CHECK UP FOR ADDITIONAL REFILLS 30 tablet 0  . amoxicillin-clavulanate (AUGMENTIN) 875-125 MG per tablet Take 1 tablet by mouth 2 (two) times daily. 20 tablet 0  . aspirin 81 MG tablet Take 81 mg by mouth daily.      . SYMBICORT 80-4.5 MCG/ACT inhaler INHALE 2 PUFFS TWICE A DAY 10.2 Inhaler 5  . traZODone (DESYREL) 50 MG tablet Take 1-2 tablets (50-100 mg total) by mouth at bedtime as needed for sleep. 45 tablet 3  . triamcinolone cream (KENALOG) 0.1 % APPLY TO AFFECTED AREA TWICE A DAY. (NOT TO FACE, GENITALS, OR AXILLES) 45 g 1  . hydrOXYzine (ATARAX/VISTARIL) 25 MG tablet Take 0.5-1 tablets (12.5-25 mg total) by mouth every 8 (eight) hours as needed for itching. (Patient not taking: Reported on 07/25/2015) 30 tablet 0  . meloxicam (MOBIC) 15 MG tablet Take 1 tablet (15 mg total) by mouth daily. (Patient not taking: Reported on 05/25/2015) 30 tablet 0  . predniSONE (DELTASONE) 20 MG tablet Two tablets daily x 5 days then one tablet daily x 5 days (Patient not taking: Reported on 09/15/2015) 15 tablet 0  . traMADol (ULTRAM) 50 MG tablet Take 1 tablet (50 mg total) by mouth every 8 (eight) hours as needed. (Patient not taking: Reported on 05/25/2015) 30 tablet 0   No current facility-administered medications on file prior to visit.  No Known Allergies   Depression screen [facility] 2/9 09/15/2015 07/25/2015 05/25/2015 04/09/2014  Decreased Interest 0 0 0 0  Down, Depressed, Hopeless 0 2 0 1  PHQ - 2 Score 0 2 0 1  Altered sleeping - 2 - -  Tired, decreased energy - 2 - -  Change in appetite - 2 - -  Feeling bad or failure about yourself  - 0 - -  Trouble concentrating - 0 - -  Moving slowly or fidgety/restless - 0 - -  Suicidal thoughts - 0 - -  PHQ-9 Score - 8 - -  Difficult doing work/chores - Somewhat difficult - -      Review of Systems   Constitutional: Positive for activity change. Negative for fever, chills and appetite change.  Cardiovascular: Negative for leg swelling.    Gastrointestinal: Positive for abdominal pain. Negative for vomiting.  Musculoskeletal: Positive for myalgias, back pain, joint swelling, arthralgias and gait problem. Negative for neck pain and neck stiffness.  Skin: Negative for color change and rash.  Neurological: Positive for numbness. Negative for weakness.  Hematological: Negative for adenopathy.  Psychiatric/Behavioral: Positive for sleep disturbance. Negative for dysphoric mood.      Objective:  BP 132/80 mmHg  Pulse 64  Temp(Src) 98 F (36.7 C) (Oral)  Resp 16  Ht 5\' 8"  (1.727 m)  Wt 211 lb (95.709 kg)  BMI 32.09 kg/m2  SpO2 98% Physical Exam  Constitutional: She is oriented to person, place, and time. She appears well-developed and well-nourished. No distress.  HENT:  Head: Normocephalic and atraumatic.  Eyes: Pupils are equal, round, and reactive to light.  Neck: Normal range of motion.  Cardiovascular: Normal rate and regular rhythm.   Pulmonary/Chest: Effort normal. No respiratory distress.  Musculoskeletal: Normal range of motion.  Unable to comply with Phalen or tinel's due to persistence  of  pain. Opposition 5/5 and grasp 5/5. Wrist flexion and extension 5/5. Full wrist ROM. No tenderness over the lumbar spine, paraspinal muscles, SI joint or greater trochanter. Mildly very slight decreased ROM in left hip, internal rotation,  Neurological: She is alert and oriented to person, place, and time.  Reflex Scores:      Tricep reflexes are 2+ on the right side and 2+ on the left side.      Bicep reflexes are 2+ on the right side and 2+ on the left side.      Brachioradialis reflexes are 2+ on the right side and 2+ on the left side.      Patellar reflexes are 2+ on the right side and 2+ on the left side.      Achilles reflexes are 2+ on the right side and 2+ on the left side. Skin: Skin is warm and dry.  Psychiatric: She has a normal mood and affect. Her behavior is normal.  Nursing note and vitals reviewed. [facility]  reading (PRIMARY) by Dr. Brigitte Pulse- Left hip x-ray is normal.  A1C 7 months prior was 5.2     Assessment & Plan:    1. Left hip pain - unknown etiology - suspect gluteal or piriformis spasm though no sciatica - problem does not actually seem to be coming from hip itself.  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  Will do trial of po prednisone in hopes of helping this as well as her carpal tunnel temporarily.  Try heat, stretching, massage.  F/u w/ ortho if pain cont.  2. Bilateral carpal tunnel syndrome - refer to hand surg as pt has failed nighttime splinting   3. Carpal tunnel syndrome, bilateral -   Pt c/o severe gastritis with meloxicam so start ppi while on prednisone.  Orders Placed This Encounter  Procedures  . DG HIP UNILAT W OR W/O PELVIS 2-3 VIEWS LEFT    Standing Status: Future     Number of Occurrences: 1     Standing Expiration Date: 09/14/2016    Order Specific Question:  Reason for Exam (SYMPTOM  OR DIAGNOSIS REQUIRED)    Answer:  pain    Order Specific Question:  Is the patient pregnant?    Answer:  No    Order Specific Question:  Preferred imaging location?    Answer:  External  . Flu vaccine greater than or equal to 3yo preservative free IM  . Ambulatory referral to Hand Surgery    Referral Priority:  Routine    Referral Type:  Surgical    Referral Reason:  Specialty Services Required    Requested Specialty:  Hand Surgery    Number of Visits Requested:  1    Meds ordered this encounter  Medications  . traMADol (ULTRAM) 50 MG tablet    Sig: Take 1 tablet (50 mg total) by mouth every 8 (eight) hours as needed.    Dispense:  30 tablet    Refill:  0  . predniSONE (DELTASONE) 20 MG tablet    Sig: 3 tabs po qd x 3d, 2 tabs qd x 3 d, then 1 tab qd x 3d    Dispense:  18 tablet    Refill:  0  . omeprazole (PRILOSEC) 40 MG capsule    Sig: Take 1 capsule (40 mg total) by mouth daily.    Dispense:  30 capsule     Refill:  3    I personally performed the services described in this documentation, which was scribed in my presence. The recorded information has been reviewed and considered, and addended by me as needed.  Delman Cheadle, MD MPH  By signing my name below, I, Nadim Abuhashem, attest that this documentation has been prepared under the direction and in the presence of Delman Cheadle, MD.  Electronically Signed: Lora Havens, medical scribe. 09/15/2015, 12:27 PM.

## 2015-09-25 ENCOUNTER — Telehealth: Payer: Self-pay

## 2015-09-25 NOTE — Telephone Encounter (Signed)
As noted on rad note: If she continues to have pain there, the next stop would be to see orthopedics to see if they might be able to do a cortisone injection or more targeted therapy for pain relief. It looks like she was referred to [facility] for her knee earlier this yr so she should not need repeat referral - she can sched her own appt. I would rec she come by and pick up a copy of her xray prior to her appt as the orthopedist will want to see the images.

## 2015-09-25 NOTE — Telephone Encounter (Signed)
Patient called regarding imaging results. 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 Patient states that she is still in pain and wants to know what she should do moving forward. Please advise.

## 2015-09-26 NOTE — Telephone Encounter (Signed)
Have asked Xray (Rodi) to put her CD at the front desk for patient to pickup. Left message for patient to call us back

## 2015-09-29 ENCOUNTER — Other Ambulatory Visit: Payer: Self-pay | Admitting: Family Medicine

## 2015-10-06 ENCOUNTER — Ambulatory Visit (INDEPENDENT_AMBULATORY_CARE_PROVIDER_SITE_OTHER): Payer: Managed Care, Other (non HMO) | Admitting: Internal Medicine

## 2015-10-06 VITALS — BP 134/78 | HR 77 | Temp 98.1°F | Resp 18 | Ht 68.0 in | Wt 209.0 lb

## 2015-10-06 DIAGNOSIS — I10 Essential (primary) hypertension: Secondary | ICD-10-CM | POA: Diagnosis not present

## 2015-10-06 DIAGNOSIS — E7800 Pure hypercholesterolemia, unspecified: Secondary | ICD-10-CM

## 2015-10-06 DIAGNOSIS — E669 Obesity, unspecified: Secondary | ICD-10-CM

## 2015-10-06 DIAGNOSIS — D649 Anemia, unspecified: Secondary | ICD-10-CM

## 2015-10-06 LAB — POCT CBC
GRANULOCYTE PERCENT: 70.6 % (ref 37–80)
HCT, POC: 39.6 % (ref 37.7–47.9)
Hemoglobin: 13.5 g/dL (ref 12.2–16.2)
Lymph, poc: 1.6 (ref 0.6–3.4)
MCH: 26.7 pg — AB (ref 27–31.2)
MCHC: 34.2 g/dL (ref 31.8–35.4)
MCV: 77.9 fL — AB (ref 80–97)
MID (CBC): 0.2 (ref 0–0.9)
MPV: 7.7 fL (ref 0–99.8)
POC Granulocyte: 4.2 (ref 2–6.9)
POC LYMPH PERCENT: 26.3 %L (ref 10–50)
POC MID %: 3.1 % (ref 0–12)
Platelet Count, POC: 295 10*3/uL (ref 142–424)
RBC: 5.08 M/uL (ref 4.04–5.48)
RDW, POC: 15.9 %
WBC: 5.9 10*3/uL (ref 4.6–10.2)

## 2015-10-06 LAB — COMPREHENSIVE METABOLIC PANEL
ALK PHOS: 107 U/L (ref 33–130)
ALT: 24 U/L (ref 6–29)
AST: 29 U/L (ref 10–35)
Albumin: 4 g/dL (ref 3.6–5.1)
BUN: 12 mg/dL (ref 7–25)
CALCIUM: 9.2 mg/dL (ref 8.6–10.4)
CO2: 23 mmol/L (ref 20–31)
Chloride: 107 mmol/L (ref 98–110)
Creat: 0.87 mg/dL (ref 0.50–1.05)
GLUCOSE: 90 mg/dL (ref 65–99)
POTASSIUM: 3.9 mmol/L (ref 3.5–5.3)
Sodium: 140 mmol/L (ref 135–146)
Total Bilirubin: 0.5 mg/dL (ref 0.2–1.2)
Total Protein: 7.2 g/dL (ref 6.1–8.1)

## 2015-10-06 LAB — LIPID PANEL
CHOL/HDL RATIO: 4.2 ratio (ref <=5.0)
Cholesterol: 216 mg/dL — ABNORMAL HIGH (ref 125–200)
HDL: 52 mg/dL (ref >=46)
LDL CALC: 147 mg/dL — AB (ref <130)
Triglycerides: 87 mg/dL (ref <150)
VLDL: 17 mg/dL (ref <30)

## 2015-10-06 MED ORDER — AMLODIPINE BESYLATE 10 MG PO TABS: ORAL_TABLET | ORAL | Status: DC

## 2015-10-06 NOTE — Progress Notes (Signed)
Subjective:  This chart was scribed for Nicole Lin, MD by Thea Alken, ED Scribe. This patient was seen in room 12 and the patient's care was started at 9:18 AM.  Patient ID: Nicole Reyes, female    DOB: [birth year], 60 y.o.   MRN: [id number]  HPI Chief Complaint  Patient presents with  . Follow-up    due for labs   . Hypertension  . Medication Refill    amlodipine   HPI Comments: Nicole Reyes is a 59 y.o. female hx of HTN who presents to the [facility] for repeat lab work. She last had labs in 01/2015. Was thought to be anemic. Here to recheck labs today.     Pt has been feeling well. No trouble sleeping. She is compliant and tolerant to all her medication. 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 She has stopped going to gym due to second job.   She has been having knee problems, initially in left but has noticed more swelling recently in right knee. She is being seen by a specialist and has received cortisone shots right knee. She has soreness in right knee but after work out pain is improved.  Also has carpal tunnel. States it becomes unbearable at time. She is being seen by [facility].    Was anemic at last OV and was put on iron  Past Medical History  Diagnosis Date  . Asthma   . Anxiety   . Hypertension   . Allergy    No Known Allergies Prior to Admission medications   Medication Sig Start Date End Date Taking? Authorizing Provider  albuterol (PROVENTIL HFA;VENTOLIN HFA) 108 (90 BASE) MCG/ACT inhaler Inhale 2 puffs into the lungs every 6 (six) hours as needed for wheezing. 04/09/14  Yes Gay Filler Copland, MD  albuterol (PROVENTIL) (2.5 MG/3ML) 0.083% nebulizer solution Take 3 mLs (2.5 mg total) by nebulization every 4 (four) hours as needed for wheezing. 07/25/15  Yes Wardell Honour, MD  Albuterol Sulfate (PROAIR RESPICLICK) 725 (90 BASE) MCG/ACT AEPB Inhale 2 puffs into the lungs every 4  (four) hours as needed. 07/25/15  Yes Wardell Honour, MD  amLODipine (NORVASC) 10 MG tablet TAKE 1 TABLET BY MOUTH EVERY DAY (PT NEEDS BLOOD PRESSURE CHECK FOR ADDITIONAL REFILLS) 09/29/15  Yes Chelle Jeffery, PA-C  aspirin 81 MG tablet Take 81 mg by mouth daily.     Yes Historical Provider, MD  omeprazole (PRILOSEC) 40 MG capsule Take 1 capsule (40 mg total) by mouth daily. 09/15/15  Yes Shawnee Knapp, MD  SYMBICORT 80-4.5 MCG/ACT inhaler INHALE 2 PUFFS TWICE A DAY 08/08/15  Yes Wardell Honour, MD  traMADol (ULTRAM) 50 MG tablet Take 1 tablet (50 mg total) by mouth every 8 (eight) hours as needed. 09/15/15  Yes Shawnee Knapp, MD  traZODone (DESYREL) 50 MG tablet Take 1-2 tablets (50-100 mg total) by mouth at bedtime as needed for sleep. 07/25/15  Yes Wardell Honour, MD  triamcinolone cream (KENALOG) 0.1 % APPLY TO AFFECTED AREA TWICE A DAY. (NOT TO FACE, GENITALS, OR AXILLES) 06/01/15  Yes Bennett Scrape V, PA-C  predniSONE (DELTASONE) 20 MG tablet 3 tabs po qd x 3d, 2 tabs qd x 3 d, then 1 tab qd x 3d Patient not taking: Reported on 10/06/2015 09/15/15   Shawnee Knapp, MD   Review of Systems Asthma stable No chest pain or palpitations No edema No headaches  Objective:   Physical Exam  Filed Vitals:   10/06/15 0905  BP: 134/78  Pulse: 77  Temp: 98.1 F (36.7 C)  TempSrc: Oral  Resp: 18  Height: 5\' 8"  (1.727 m)  Weight: 209 lb (94.802 kg)  SpO2: 98%   HEENT clear Neck supple Heart regular without murmur No peripheral edema  Results for orders placed or performed in visit on 10/06/15  POCT CBC  Result Value Ref Range   WBC 5.9 4.6 - 10.2 K/uL   Lymph, poc 1.6 0.6 - 3.4   POC LYMPH PERCENT 26.3 10 - 50 %L   MID (cbc) 0.2 0 - 0.9   POC MID % 3.1 0 - 12 %M   POC Granulocyte 4.2 2 - 6.9   Granulocyte percent 70.6 37 - 80 %G   RBC 5.08 4.04 - 5.48 M/uL   Hemoglobin 13.5 12.2 - 16.2 g/dL   HCT, POC 39.6 37.7 - 47.9 %   MCV 77.9 (A) 80 - 97 fL   MCH, POC 26.7 (A) 27 - 31.2 pg   MCHC 34.2  31.8 - 35.4 g/dL   RDW, POC 15.9 %   Platelet Count, POC 295 142 - 424 K/uL   MPV 7.7 0 - 99.8 fL    Assessment & Plan:   1. Essential hypertension -continue meds   2. Pure hypercholesterolemia --currently treated with diet only   3. Obesity --- discussed more aggressive approach Nicole Reyes is holding her own with exercise through a second job   4. Anemia, unspecified anemia type --- now resolved     Orders Placed This Encounter  Procedures  . Comprehensive metabolic panel  . Lipid panel  . POCT CBC    Meds ordered this encounter  Medications  . amLODipine (NORVASC) 10 MG tablet    Sig: TAKE 1 TABLET BY MOUTH EVERY DAY    Dispense:  90 tablet    Refill:  1   follow-up 6 months I have completed the patient encounter in its entirety as documented by the scribe, with editing by me where necessary. Rendell Thivierge [name], M.D.

## 2015-10-08 ENCOUNTER — Encounter: Payer: Self-pay | Admitting: Internal Medicine

## 2015-10-29 ENCOUNTER — Ambulatory Visit (INDEPENDENT_AMBULATORY_CARE_PROVIDER_SITE_OTHER): Payer: Managed Care, Other (non HMO) | Admitting: Family Medicine

## 2015-10-29 VITALS — BP 146/86 | HR 89 | Temp 98.1°F | Resp 16 | Ht 66.5 in | Wt 211.4 lb

## 2015-10-29 DIAGNOSIS — L509 Urticaria, unspecified: Secondary | ICD-10-CM | POA: Diagnosis not present

## 2015-10-29 MED ORDER — METHYLPREDNISOLONE ACETATE 80 MG/ML IJ SUSP
80.0000 mg | Freq: Once | INTRAMUSCULAR | Status: AC
  Administered 2015-10-29: 80 mg via INTRAMUSCULAR

## 2015-10-29 MED ORDER — PREDNISONE 20 MG PO TABS: ORAL_TABLET | ORAL | Status: DC

## 2015-10-29 MED ORDER — CETIRIZINE HCL 10 MG PO TABS: 10.0000 mg | ORAL_TABLET | Freq: Every day | ORAL | Status: DC

## 2015-10-29 NOTE — Patient Instructions (Signed)
Hives Hives are itchy, red, swollen areas of the skin. They can vary in size and location on your body. Hives can come and go for hours or several days (acute hives) or for several weeks (chronic hives). Hives do not spread from person to person (noncontagious). They may get worse with scratching, exercise, and emotional stress. CAUSES   Allergic reaction to food, additives, or drugs.  Infections, including the common cold.  Illness, such as vasculitis, lupus, or thyroid disease.  Exposure to sunlight, heat, or cold.  Exercise.  Stress.  Contact with chemicals. SYMPTOMS   Red or white swollen patches on the skin. The patches may change size, shape, and location quickly and repeatedly.  Itching.  Swelling of the hands, feet, and face. This may occur if hives develop deeper in the skin. DIAGNOSIS  Your caregiver can usually tell what is wrong by performing a physical exam. Skin or blood tests may also be done to determine the cause of your hives. In some cases, the cause cannot be determined. TREATMENT  Mild cases usually get better with medicines such as antihistamines. Severe cases may require an emergency epinephrine injection. If the cause of your hives is known, treatment includes avoiding that trigger.  HOME CARE INSTRUCTIONS   Avoid causes that trigger your hives.  Take antihistamines as directed by your caregiver to reduce the severity of your hives. Non-sedating or low-sedating antihistamines are usually recommended. Do not drive while taking an antihistamine.  Take any other medicines prescribed for itching as directed by your caregiver.  Wear loose-fitting clothing.  Keep all follow-up appointments as directed by your caregiver. SEEK MEDICAL CARE IF:   You have persistent or severe itching that is not relieved with medicine.  You have painful or swollen joints. SEEK IMMEDIATE MEDICAL CARE IF:   You have a fever.  Your tongue or lips are swollen.  You have  trouble breathing or swallowing.  You feel tightness in the throat or chest.  You have abdominal pain. These problems may be the first sign of a life-threatening allergic reaction. Call your local emergency services (911 in [place] MAKE SURE YOU:   Understand these instructions.  Will watch your condition.  Will get help right away if you are not doing well or get worse.   This information is not intended to replace advice given to you by your health care provider. Make sure you discuss any questions you have with your health care provider.   Document Released: 11/30/2005 Document Revised: 12/05/2013 Document Reviewed: 02/23/2012 Elsevier Interactive Patient Education 2016 Elsevier Inc.  

## 2015-10-29 NOTE — Progress Notes (Signed)
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 She presents with hives.  Her symptoms began over the weekend and she had some mild itching. She took some Benadryl expect that the symptoms to subside. Instead, she developed acute hives which persisted over the last 24 hours. She says she's had a little shortness of breath but no wheezing. She's had no eye swelling or mouth swelling. She's not taking any other medications the present time.  In the past patient has had hives at this time of year.  She recalls eating quite a few walnuts the other day. She's had hives in response to eating peanuts in the past.  Objective: Patient is somewhat tremulous and itching from top to bottom. BP 146/86 mmHg  Pulse 89  Temp(Src) 98.1 F (36.7 C) (Oral)  Resp 16  Ht 5' 6.5" (1.689 m)  Wt 211 lb 6.4 oz (95.89 kg)  BMI 33.61 kg/m2  SpO2 98% Her lungs are clear, heart is regular without murmur Oropharynx is clear of any swelling Face shows no swelling Skin has diffuse papules that are evenly distributed. Palms are spared, but the dorsal hands, arms, back, chest, and legs all have this prickly papular eruption.  Assessment: Hives in response to the walnuts.   This chart was scribed in my presence and reviewed by me personally.    ICD-9-CM ICD-10-CM   1. Hives 708.9 L50.9 methylPREDNISolone acetate (DEPO-MEDROL) injection 80 mg     predniSONE (DELTASONE) 20 MG tablet     cetirizine (ZYRTEC) 10 MG tablet     Signed, Robyn Haber, MD

## 2016-01-03 ENCOUNTER — Encounter: Payer: Self-pay | Admitting: Family Medicine

## 2016-01-07 ENCOUNTER — Other Ambulatory Visit: Payer: Self-pay | Admitting: Family Medicine

## 2016-01-08 ENCOUNTER — Encounter: Payer: Self-pay | Admitting: Family Medicine

## 2016-01-28 ENCOUNTER — Ambulatory Visit (INDEPENDENT_AMBULATORY_CARE_PROVIDER_SITE_OTHER): Payer: Managed Care, Other (non HMO) | Admitting: Family Medicine

## 2016-01-28 VITALS — BP 128/74 | HR 85 | Temp 98.0°F | Resp 16 | Ht 66.0 in | Wt 218.0 lb

## 2016-01-28 DIAGNOSIS — L509 Urticaria, unspecified: Secondary | ICD-10-CM

## 2016-01-28 DIAGNOSIS — M533 Sacrococcygeal disorders, not elsewhere classified: Secondary | ICD-10-CM

## 2016-01-28 DIAGNOSIS — G8929 Other chronic pain: Secondary | ICD-10-CM

## 2016-01-28 DIAGNOSIS — I10 Essential (primary) hypertension: Secondary | ICD-10-CM

## 2016-01-28 DIAGNOSIS — Z0000 Encounter for general adult medical examination without abnormal findings: Secondary | ICD-10-CM

## 2016-01-28 DIAGNOSIS — Z91018 Allergy to other foods: Secondary | ICD-10-CM | POA: Diagnosis not present

## 2016-01-28 MED ORDER — EPINEPHRINE 0.3 MG/0.3ML IJ SOAJ: 0.3000 mg | Freq: Once | INTRAMUSCULAR | Status: DC

## 2016-01-28 MED ORDER — TRIAMCINOLONE ACETONIDE 0.1 % EX CREA: TOPICAL_CREAM | CUTANEOUS | Status: DC

## 2016-01-28 MED ORDER — CETIRIZINE HCL 10 MG PO TABS: 5.0000 mg | ORAL_TABLET | Freq: Every day | ORAL | Status: DC

## 2016-01-28 MED ORDER — AMLODIPINE BESYLATE 10 MG PO TABS: ORAL_TABLET | ORAL | Status: DC

## 2016-01-28 NOTE — Progress Notes (Signed)
[facility] [address] [place] [address] [phone]  Date:  01/28/2016   Name:  Nicole Reyes   DOB:  09/18/1956   MRN:  [id number]  PCP:  Lamar Blinks, MD    Chief Complaint: Employment Physical   History of Present Illness:  Nicole Reyes is a 60 y.o. very pleasant female patient who presents with the following:  History of HTN, asthma, obesity, high cholesterol.  Here today seeking a CPE Colonoscopy: 2012, told to follow-up in 10 years  Pap and mammo 10/2015 per her OBG at [facility] Flu shot, tetanus UTD  Labs done in October of 2016 She did have some hives back in November- this got better but seemed to come back the last few days.  She did take some benadryl last night- it did help but she could not take it today as it makes her sleepy  We had thought that she might have a nut sensitivity after she reacted to walnuts last year.  She ate almonds yesterday and walnuts again last week.   She did take her BP medications this am  She uses symbicort for her asthma. She rarely uses the albuterol  Wt Readings from Last 3 Encounters:  01/28/16 218 lb (98.884 kg)  10/29/15 211 lb 6.4 oz (95.89 kg)  10/06/15 209 lb (94.802 kg)   She continues to gain weight.  However she is exercising.  She notes some pain in the left buttock which has been present for some months- she was noted to have SI joint degenerative change on plain films in October  BP Readings from Last 3 Encounters:  01/28/16 172/86  10/29/15 146/86  10/06/15 134/78     Patient Active Problem List   Diagnosis Date Noted  . Obesity 01/25/2015  . Pure hypercholesterolemia 02/10/2014  . Asthma 12/19/2012  . HTN (hypertension) 12/19/2012    Past Medical History  Diagnosis Date  . Asthma   . Anxiety   . Hypertension   . Allergy     Past Surgical History  Procedure Laterality Date  . Laparoscopy      REMOVED SCARE TISSUE FROM OVARIES  . Dilation and  curettage of uterus    . Colonoscopy&endoscopy  1980's    PT UNSURE PLACE&MD WHO DID EXAMS=NORMAL  . Colonoscopy  06/29/2011    Normal    Social History  Substance Use Topics  . Smoking status: Never Smoker   . Smokeless tobacco: Never Used  . Alcohol Use: No    Family History  Problem Relation Age of Onset  . Colon cancer Neg Hx   . Alcohol abuse Father   . Heart disease Father 9    AMI age 46  . Stroke Father 51    CVA  . Hypertension Mother   . Asthma Mother   . Hypertension Sister   . Hypertension Brother   . Hypertension Sister   . Hypertension Sister   . Hypertension Sister   . Hypertension Brother     No Known Allergies  Medication list has been reviewed and updated.  Current Outpatient Prescriptions on File Prior to Visit  Medication Sig Dispense Refill  . amLODipine (NORVASC) 10 MG tablet TAKE 1 TABLET BY MOUTH EVERY DAY 90 tablet 1  . aspirin 81 MG tablet Take 81 mg by mouth daily.      . cetirizine (ZYRTEC) 10 MG tablet Take 1 tablet (10 mg total) by mouth daily. 30 tablet 11  . omeprazole (PRILOSEC) 40 MG  capsule Take 1 capsule (40 mg total) by mouth daily. 30 capsule 3  . PROAIR HFA 108 (90 Base) MCG/ACT inhaler INHALE 2 PUFFS INTO THE LUNGS EVERY 6 (SIX) HOURS AS NEEDED FOR WHEEZING. 25 Inhaler 2  . SYMBICORT 80-4.5 MCG/ACT inhaler INHALE 2 PUFFS TWICE A DAY 10.2 Inhaler 5  . triamcinolone cream (KENALOG) 0.1 % APPLY TO AFFECTED AREA TWICE A DAY. (NOT TO FACE, GENITALS, OR AXILLES) 45 g 1  . predniSONE (DELTASONE) 20 MG tablet Two daily with food (Patient not taking: Reported on 01/28/2016) 10 tablet 0  . traMADol (ULTRAM) 50 MG tablet Take 1 tablet (50 mg total) by mouth every 8 (eight) hours as needed. (Patient not taking: Reported on 01/28/2016) 30 tablet 0  . traZODone (DESYREL) 50 MG tablet Take 1-2 tablets (50-100 mg total) by mouth at bedtime as needed for sleep. (Patient not taking: Reported on 10/29/2015) 45 tablet 3   No current  facility-administered medications on file prior to visit.    Review of Systems:  As per HPI- otherwise negative.  Physical Examination: Filed Vitals:   01/28/16 0823  BP: 172/86  Pulse: 85  Temp: 98 F (36.7 C)  Resp: 16   Filed Vitals:   01/28/16 0823  Height: 5\' 6"  (1.676 m)  Weight: 218 lb (98.884 kg)   Body mass index is 35.2 kg/(m^2). Ideal Body Weight: Weight in (lb) to have BMI = 25: 154.6  GEN: WDWN, NAD, Non-toxic, A & O x 3, obese, looks well HEENT: Atraumatic, Normocephalic. Neck supple. No masses, No LAD.  Bilateral TM wnl, oropharynx normal.  PEERL,EOMI.   No angioedema Ears and Nose: No external deformity. CV: RRR, No M/G/R. No JVD. No thrill. No extra heart sounds. PULM: CTA B, no wheezes, crackles, rhonchi. No retractions. No resp. distress. No accessory muscle use. ABD: S, NT, ND, +BS. No rebound. No HSM. EXTR: No c/c/e NEURO Normal gait.  PSYCH: Normally interactive. Conversant. Not depressed or anxious appearing.  Calm demeanor.  Fine palpable rash on her trunk but no visible hives She is tender to deep palp over the left SI joint   Assessment and Plan: Physical exam  Essential hypertension - Plan: amLODipine (NORVASC) 10 MG tablet  Morbid obesity, unspecified obesity type (HCC)  Chronic left SI joint pain  Hives - Plan: triamcinolone cream (KENALOG) 0.1 %, EPINEPHrine 0.3 mg/0.3 mL IJ SOAJ injection, cetirizine (ZYRTEC) tablet 5 mg  Nut allergy - Plan: EPINEPHrine 0.3 mg/0.3 mL IJ SOAJ injection   Counseled her that she is likely allergic to nuts and should not continue to eat them- given rx for an epipen to have on hand in case of any more serious allergic reaction Refilled her BP medication She will discuss her SI joint issues with her orthopedist Given a dose of zyrtec here in clinic for her rash  Signed Lamar Blinks, MD

## 2016-01-28 NOTE — Patient Instructions (Addendum)
It does sound like you have a nut allergy. Avoid eating nuts as you may have a more severe allergic reaction in the future- these can be unpredictable and can get worse!  Benadryl is the first step for hives or itching but can cause some sedation  Please keep the epipen device handy in case you were to have a more severe allergic reaction with any swelling of your lips or tongue or shortness of breath.    Continue the norvasc for your blood pressure Your weight has gone up- please work on this!    It sounds to me like you have a problem with your right SI joint (joint in your pelvis).  Please discuss this with your orthopedist as you may benefit from an injection to this area or other treatment   Your blood pressure is a bit high today.  Please check this at home or work approx twice a week.  If you continue to run higher than 140/85 please give me a call or email.  Otherwise let's plan to recheck in approx 6 months  It would be my pleasure to continue to see you as a patient at my new office, starting the last week of February.  If you prefer to remain at [facility] one of my partners will be happy to see you here.   [facility] [address] [place],  [address] Phone: [phone]

## 2016-02-04 ENCOUNTER — Other Ambulatory Visit: Payer: Self-pay | Admitting: Family Medicine

## 2016-03-21 ENCOUNTER — Ambulatory Visit (INDEPENDENT_AMBULATORY_CARE_PROVIDER_SITE_OTHER): Payer: Managed Care, Other (non HMO) | Admitting: Internal Medicine

## 2016-03-21 VITALS — BP 148/80 | HR 70 | Temp 97.5°F | Resp 16 | Ht 65.0 in | Wt 215.0 lb

## 2016-03-21 DIAGNOSIS — L508 Other urticaria: Secondary | ICD-10-CM

## 2016-03-21 DIAGNOSIS — Z8709 Personal history of other diseases of the respiratory system: Secondary | ICD-10-CM

## 2016-03-21 DIAGNOSIS — L509 Urticaria, unspecified: Secondary | ICD-10-CM | POA: Diagnosis not present

## 2016-03-21 LAB — COMPREHENSIVE METABOLIC PANEL
ALBUMIN: 4.2 g/dL (ref 3.6–5.1)
ALT: 28 U/L (ref 6–29)
AST: 38 U/L — AB (ref 10–35)
Alkaline Phosphatase: 120 U/L (ref 33–130)
BILIRUBIN TOTAL: 0.7 mg/dL (ref 0.2–1.2)
BUN: 11 mg/dL (ref 7–25)
CALCIUM: 9 mg/dL (ref 8.6–10.4)
CHLORIDE: 105 mmol/L (ref 98–110)
CO2: 24 mmol/L (ref 20–31)
CREATININE: 0.8 mg/dL (ref 0.50–1.05)
GLUCOSE: 84 mg/dL (ref 65–99)
Potassium: 3.7 mmol/L (ref 3.5–5.3)
Sodium: 139 mmol/L (ref 135–146)
Total Protein: 7.4 g/dL (ref 6.1–8.1)

## 2016-03-21 LAB — POCT CBC
GRANULOCYTE PERCENT: 40.8 % (ref 37–80)
HCT, POC: 37.6 % — AB (ref 37.7–47.9)
HEMOGLOBIN: 13.2 g/dL (ref 12.2–16.2)
Lymph, poc: 1.8 (ref 0.6–3.4)
MCH: 27.1 pg (ref 27–31.2)
MCHC: 35.1 g/dL (ref 31.8–35.4)
MCV: 77.2 fL — AB (ref 80–97)
MID (cbc): 0.3 (ref 0–0.9)
MPV: 7.7 fL (ref 0–99.8)
PLATELET COUNT, POC: 254 10*3/uL (ref 142–424)
POC Granulocyte: 1.4 — AB (ref 2–6.9)
POC LYMPH PERCENT: 50.5 %L — AB (ref 10–50)
POC MID %: 8.7 % (ref 0–12)
RBC: 4.88 M/uL (ref 4.04–5.48)
RDW, POC: 14.1 %
WBC: 3.5 10*3/uL — AB (ref 4.6–10.2)

## 2016-03-21 LAB — POCT SEDIMENTATION RATE: POCT SED RATE: 4 mm/hr (ref 0–22)

## 2016-03-21 LAB — TSH: TSH: 1.37 mIU/L

## 2016-03-21 MED ORDER — CETIRIZINE HCL 10 MG PO TABS
10.0000 mg | ORAL_TABLET | Freq: Every day | ORAL | Status: DC
Start: 2016-03-21 — End: 2016-04-16

## 2016-03-21 MED ORDER — PREDNISONE 20 MG PO TABS: ORAL_TABLET | ORAL | Status: DC

## 2016-03-21 NOTE — Patient Instructions (Signed)
     IF you received an x-ray today, you will receive an invoice from [facility]. Please contact [facility] at [phone] with questions or concerns regarding your invoice.   IF you received labwork today, you will receive an invoice from [facility]. Please contact [facility] at [phone] with questions or concerns regarding your invoice.   Our billing staff will not be able to assist you with questions regarding bills from these companies.  You will be contacted with the lab results as soon as they are available. The fastest way to get your results is to activate your My Chart account. Instructions are located on the last page of this paperwork. If you have not heard from us regarding the results in 2 weeks, please contact this office.      

## 2016-03-21 NOTE — Progress Notes (Signed)
Subjective:  By signing my name below, I, Nicole Reyes, attest that this documentation has been prepared under the direction and in the presence of [name], MD. Electronically Signed: Moises Reyes, [place]. 03/21/2016 , 3:03 PM .  Patient was seen in Room 10 .   Patient ID: Nicole Reyes, female    DOB: 21-Jul-1956, 60 y.o.   MRN: [id number] Chief Complaint  Patient presents with  . Rash    with itching   . Nausea    last week sunday / another episode 2 weeks ago    HPI Nicole Reyes is a 60 y.o. female who presents to [name] complaining of an itchy rash located over her arms, her back and over her abdomen that was noticed since her physical done back in 01/28/16. She notes that this worsened over the past week. She denies rash spreading down to her legs and her feet. She denies any changes with her medications. She denies history of eczema. Trouble with rash has been intermittent and greater than 1 yr  She mentions feeling nauseous a week ago and wasn't able to keep any food down.   She inspects fabrics for work. She denies any contact with new chemical.   Patient Active Problem List   Diagnosis Date Noted  . Essential hypertension 01/28/2016  . 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 Obesity 01/25/2015  . Pure hypercholesterolemia 02/10/2014  . Asthma 12/19/2012  . HTN (hypertension) 12/19/2012    Current outpatient prescriptions:  .  amLODipine (NORVASC) 10 MG tablet, TAKE 1 TABLET BY MOUTH EVERY DAY, Disp: 90 tablet, Rfl: 2 .  aspirin 81 MG tablet, Take 81 mg by mouth daily.  , Disp: , Rfl:  .  omeprazole (PRILOSEC) 40 MG capsule, Take 1 capsule (40 mg total) by mouth daily., Disp: 30 capsule, Rfl: 3 .  PROAIR HFA 108 (90 Base) MCG/ACT inhaler, INHALE 2 PUFFS INTO THE LUNGS EVERY 6 (SIX) HOURS AS NEEDED FOR WHEEZING., Disp: 25 Inhaler, Rfl: 2 .  SYMBICORT 80-4.5 MCG/ACT inhaler, INHALE 2 PUFFS TWICE A DAY, Disp: 10.2 Inhaler,  Rfl: 5 .  triamcinolone cream (KENALOG) 0.1 %, APPLY TO AFFECTED AREA TWICE A DAY. (NOT TO FACE, GENITALS, OR AXILLES), Disp: 45 g, Rfl: 1 .  cetirizine (ZYRTEC) 10 MG tablet, Take 1 tablet (10 mg total) by mouth daily. (Patient not taking: Reported on 03/21/2016), Disp: 30 tablet, Rfl: 11 .  EPINEPHrine 0.3 mg/0.3 mL IJ SOAJ injection, Inject 0.3 mLs (0.3 mg total) into the muscle once. Use if needed for allergic reaction emergency.  Ok to sub generic device (Patient not taking: Reported on 03/21/2016), Disp: 1 Device, Rfl: 1 .  traZODone (DESYREL) 50 MG tablet, Take 1-2 tablets (50-100 mg total) by mouth at bedtime as needed for sleep. (Patient not taking: Reported on 10/29/2015), Disp: 45 tablet, Rfl: 3  Current facility-administered medications:  .  cetirizine (ZYRTEC) tablet 5 mg, 5 mg, Oral, Daily, Gay Filler Copland, MD  Review of Systems  Constitutional: Positive for appetite change. Negative for fever, chills and fatigue.  Gastrointestinal: Positive for nausea. Negative for vomiting, abdominal pain, diarrhea and constipation.  Skin: Positive for rash. Negative for wound.  Neurological: Negative for dizziness, weakness and numbness.      Objective:   Physical Exam  Constitutional: She is oriented to person, place, and time. She appears well-developed and well-nourished. No distress.  HENT:  Head: Normocephalic and atraumatic.  Eyes: EOM are normal. Pupils are equal,  round, and reactive to light.  Neck: Neck supple.  Cardiovascular: Normal rate.   Pulmonary/Chest: Effort normal. No respiratory distress.  Musculoskeletal: Normal range of motion.  Neurological: She is alert and oriented to person, place, and time.  Skin: Skin is warm and dry.  Erythema with micro-papules over her arms, back and abdomen; there are areas with thickening skin on the upper arms, lower back, and groin area with no vesicular changes, no hive formation, and no scaliness, does not affect the palms, soles or the  face, no regional adenopathy  Psychiatric: She has a normal mood and affect. Her behavior is normal.  Nursing note and vitals reviewed.   BP 148/80 mmHg  Pulse 70  Temp(Src) 97.5 F (36.4 C)  Resp 16  Ht 5\' 5"  (1.651 m)  Wt 215 lb (97.523 kg)  BMI 35.78 kg/m2  SpO2 98%    Assessment & Plan:  I have completed the patient encounter in its entirety as documented by the scribe, with editing by me where necessary. [name] [name], M.D. Chronic urticaria - Plan: POCT CBC, POCT SEDIMENTATION RATE, Comprehensive metabolic panel, TSH, ANA, IFA Comprehensive Panel  pruritis - Plan: cetirizine (ZYRTEC) 10 MG tablet  History of asthma  R/o underlying illness w/ labs Meds ordered this encounter  Medications  . predniSONE (DELTASONE) 20 MG tablet    Sig: 3/3/3/3/2/2/2/2/1/1/1/1 then 1/2 for 4 more days( single daily dose for 16 days)    Dispense:  26 tablet    Refill:  0  . cetirizine (ZYRTEC) 10 MG tablet    Sig: Take 1 tablet (10 mg total) by mouth daily.    Dispense:  30 tablet    Refill:  11   Cool showers If relapses call for ref to allergist

## 2016-03-25 LAB — ANA, IFA COMPREHENSIVE PANEL
ANA: NEGATIVE
DS DNA AB: 2 [IU]/mL
ENA SM Ab Ser-aCnc: <1
SM/RNP: <1
SSA (RO) (ENA) ANTIBODY, IGG: NEGATIVE
SSB (LA) (ENA) ANTIBODY, IGG: NEGATIVE
Scleroderma (Scl-70) (ENA) Antibody, IgG: <1

## 2016-03-26 ENCOUNTER — Encounter: Payer: Self-pay | Admitting: Internal Medicine

## 2016-04-16 ENCOUNTER — Ambulatory Visit (INDEPENDENT_AMBULATORY_CARE_PROVIDER_SITE_OTHER): Payer: Managed Care, Other (non HMO) | Admitting: Family Medicine

## 2016-04-16 VITALS — BP 142/78 | HR 88 | Temp 98.2°F | Resp 18 | Ht 65.5 in | Wt 219.0 lb

## 2016-04-16 DIAGNOSIS — L500 Allergic urticaria: Secondary | ICD-10-CM

## 2016-04-16 MED ORDER — HYDROXYZINE HCL 25 MG PO TABS: 12.5000 mg | ORAL_TABLET | Freq: Three times a day (TID) | ORAL | Status: DC | PRN

## 2016-04-16 MED ORDER — METHYLPREDNISOLONE ACETATE 80 MG/ML IJ SUSP
80.0000 mg | Freq: Once | INTRAMUSCULAR | Status: AC
  Administered 2016-04-16: 80 mg via INTRAMUSCULAR

## 2016-04-16 MED ORDER — RANITIDINE HCL 150 MG PO TABS
150.0000 mg | ORAL_TABLET | Freq: Two times a day (BID) | ORAL | Status: DC
Start: 2016-04-16 — End: 2017-10-02

## 2016-04-16 NOTE — Patient Instructions (Addendum)
Hives Hives are itchy, red, swollen areas of the skin. They can vary in size and location on your body. Hives can come and go for hours or several days (acute hives) or for several weeks (chronic hives). Hives do not spread from person to person (noncontagious). They may get worse with scratching, exercise, and emotional stress. CAUSES   Allergic reaction to food, additives, or drugs.  Infections, including the common cold.  Illness, such as vasculitis, lupus, or thyroid disease.  Exposure to sunlight, heat, or cold.  Exercise.  Stress.  Contact with chemicals. SYMPTOMS   Red or white swollen patches on the skin. The patches may change size, shape, and location quickly and repeatedly.  Itching.  Swelling of the hands, feet, and face. This may occur if hives develop deeper in the skin. DIAGNOSIS  Your caregiver can usually tell what is wrong by performing a physical exam. Skin or blood tests may also be done to determine the cause of your hives. In some cases, the cause cannot be determined. TREATMENT  Mild cases usually get better with medicines such as antihistamines. Severe cases may require an emergency epinephrine injection. If the cause of your hives is known, treatment includes avoiding that trigger.  HOME CARE INSTRUCTIONS   Avoid causes that trigger your hives.  Take antihistamines as directed by your caregiver to reduce the severity of your hives. Non-sedating or low-sedating antihistamines are usually recommended. Do not drive while taking an antihistamine.  Take any other medicines prescribed for itching as directed by your caregiver.  Wear loose-fitting clothing.  Keep all follow-up appointments as directed by your caregiver. SEEK MEDICAL CARE IF:   You have persistent or severe itching that is not relieved with medicine.  You have painful or swollen joints. SEEK IMMEDIATE MEDICAL CARE IF:   You have a fever.  Your tongue or lips are swollen.  You have  trouble breathing or swallowing.  You feel tightness in the throat or chest.  You have abdominal pain. These problems may be the first sign of a life-threatening allergic reaction. Call your local emergency services (911 in [place] MAKE SURE YOU:   Understand these instructions.  Will watch your condition.  Will get help right away if you are not doing well or get worse.   This information is not intended to replace advice given to you by your health care provider. Make sure you discuss any questions you have with your health care provider.   Document Released: 11/30/2005 Document Revised: 12/05/2013 Document Reviewed: 02/23/2012 Elsevier Interactive Patient Education 2016 Elsevier Inc.  

## 2016-04-16 NOTE — Progress Notes (Signed)
60 yo woman who works at [employer] for 28 years and has developed urticarial rash over the past 2 years which responds to steroids but keeps recurring.  The itching and plaque formation with induration diffusely over her whole body is resulting in work loss and sleep loss.  She  Has a nut allergy, but does not recall any contact with nuts.  She has a niece with a severe skin allergy  Breathing and mouth are normal, although she relies on inhaler for the past 20 years.  Objective: BP 142/78 mmHg  Pulse 88  Temp(Src) 98.2 F (36.8 C)  Resp 18  Ht 5' 5.5" (1.664 m)  Wt 219 lb (99.338 kg)  BMI 35.88 kg/m2  SpO2 97% Results for orders placed or performed in visit on 03/21/16  Comprehensive metabolic panel  Result Value Ref Range   Sodium 139 135 - 146 mmol/L   Potassium 3.7 3.5 - 5.3 mmol/L   Chloride 105 98 - 110 mmol/L   CO2 24 20 - 31 mmol/L   Glucose, Bld 84 65 - 99 mg/dL   BUN 11 7 - 25 mg/dL   Creat 0.80 0.50 - 1.05 mg/dL   Total Bilirubin 0.7 0.2 - 1.2 mg/dL   Alkaline Phosphatase 120 33 - 130 U/L   AST 38 (H) 10 - 35 U/L   ALT 28 6 - 29 U/L   Total Protein 7.4 6.1 - 8.1 g/dL   Albumin 4.2 3.6 - 5.1 g/dL   Calcium 9.0 8.6 - 10.4 mg/dL  TSH  Result Value Ref Range   TSH 1.37 mIU/L  ANA, IFA Comprehensive Panel  Result Value Ref Range   Anit Nuclear Antibody(ANA) NEG NEGATIVE   ds DNA Ab 2 IU/mL   Scleroderma (Scl-70) (ENA) Antibody, IgG <1.0 NEG <1.0 NEG AI   SSA (Ro) (ENA) Antibody, IgG <1.0 NEG <1.0 NEG AI   SSB (La) (ENA) Antibody, IgG <1.0 NEG <1.0 NEG AI   ENA SM Ab Ser-aCnc <1.0 NEG <1.0 NEG AI   SM/RNP <1.0 NEG <1.0 NEG AI  POCT CBC  Result Value Ref Range   WBC 3.5 (A) 4.6 - 10.2 K/uL   Lymph, poc 1.8 0.6 - 3.4   POC LYMPH PERCENT 50.5 (A) 10 - 50 %L   MID (cbc) 0.3 0 - 0.9   POC MID % 8.7 0 - 12 %M   POC Granulocyte 1.4 (A) 2 - 6.9   Granulocyte percent 40.8 37 - 80 %G   RBC 4.88 4.04 - 5.48 M/uL   Hemoglobin 13.2 12.2 - 16.2 g/dL   HCT, POC  37.6 (A) 37.7 - 47.9 %   MCV 77.2 (A) 80 - 97 fL   MCH, POC 27.1 27 - 31.2 pg   MCHC 35.1 31.8 - 35.4 g/dL   RDW, POC 14.1 %   Platelet Count, POC 254 142 - 424 K/uL   MPV 7.7 0 - 99.8 fL  POCT SEDIMENTATION RATE  Result Value Ref Range   POCT SED RATE 4 0 - 22 mm/hr   Oroph: clear Neck: supple  Skin:  Diffuse plaque and urticarial rash entire torso.  Assessment:  Chronic urticaria  Allergic urticaria - Plan: Ambulatory referral to Dermatology, methylPREDNISolone acetate (DEPO-MEDROL) injection 80 mg, hydrOXYzine (ATARAX/VISTARIL) 25 MG tablet, ranitidine (ZANTAC) 150 MG tablet  Robyn Haber, MD

## 2016-04-22 ENCOUNTER — Other Ambulatory Visit: Payer: Self-pay

## 2016-04-22 DIAGNOSIS — L509 Urticaria, unspecified: Secondary | ICD-10-CM

## 2016-04-22 MED ORDER — TRIAMCINOLONE ACETONIDE 0.1 % EX CREA: TOPICAL_CREAM | CUTANEOUS | Status: DC

## 2016-04-22 NOTE — Telephone Encounter (Signed)
Will RF x 1 while waiting for derm referral appt.

## 2016-07-03 ENCOUNTER — Other Ambulatory Visit: Payer: Self-pay | Admitting: Family Medicine

## 2016-12-02 ENCOUNTER — Ambulatory Visit (INDEPENDENT_AMBULATORY_CARE_PROVIDER_SITE_OTHER): Payer: Managed Care, Other (non HMO) | Admitting: Urgent Care

## 2016-12-02 VITALS — BP 128/80 | HR 90 | Temp 98.3°F | Resp 17 | Ht 69.5 in | Wt 223.0 lb

## 2016-12-02 DIAGNOSIS — J22 Unspecified acute lower respiratory infection: Secondary | ICD-10-CM

## 2016-12-02 DIAGNOSIS — J45909 Unspecified asthma, uncomplicated: Secondary | ICD-10-CM | POA: Diagnosis not present

## 2016-12-02 DIAGNOSIS — R05 Cough: Secondary | ICD-10-CM | POA: Diagnosis not present

## 2016-12-02 DIAGNOSIS — R059 Cough, unspecified: Secondary | ICD-10-CM

## 2016-12-02 MED ORDER — ALBUTEROL SULFATE (2.5 MG/3ML) 0.083% IN NEBU
2.5000 mg | INHALATION_SOLUTION | Freq: Once | RESPIRATORY_TRACT | Status: AC
  Administered 2016-12-02: 2.5 mg via RESPIRATORY_TRACT

## 2016-12-02 MED ORDER — AZITHROMYCIN 250 MG PO TABS: ORAL_TABLET | ORAL | 0 refills | Status: DC

## 2016-12-02 MED ORDER — IPRATROPIUM BROMIDE 0.02 % IN SOLN
0.5000 mg | Freq: Once | RESPIRATORY_TRACT | Status: AC
  Administered 2016-12-02: 0.5 mg via RESPIRATORY_TRACT

## 2016-12-02 MED ORDER — HYDROCODONE-HOMATROPINE 5-1.5 MG/5ML PO SYRP: 5.0000 mL | ORAL_SOLUTION | Freq: Every evening | ORAL | 0 refills | Status: DC | PRN

## 2016-12-02 MED ORDER — BENZONATATE 100 MG PO CAPS: 100.0000 mg | ORAL_CAPSULE | Freq: Three times a day (TID) | ORAL | 0 refills | Status: DC | PRN

## 2016-12-02 NOTE — Patient Instructions (Addendum)
- Restart cetirizine daily for at least the next 2 weeks. - Schedule your albuterol (rescue) inhaler 2-3 times daily, at least 6 hours apart. Use just 2 times daily if it keeps you up at night. - Cough syrup should be used at bedtime. - Tessalon during the day.  - Come back to our clinic if you have no improvement by Saturday.    Cough, Adult Coughing is a reflex that clears your throat and your airways. Coughing helps to heal and protect your lungs. It is normal to cough occasionally, but a cough that happens with other symptoms or lasts a long time may be a sign of a condition that needs treatment. A cough may last only 2-3 weeks (acute), or it may last longer than 8 weeks (chronic). What are the causes? Coughing is commonly caused by:  Breathing in substances that irritate your lungs.  A viral or bacterial respiratory infection.  Allergies.  Asthma.  Postnasal drip.  Smoking.  Acid backing up from the stomach into the esophagus (gastroesophageal reflux).  Certain medicines.  Chronic lung problems, including COPD (or rarely, lung cancer).  Other medical conditions such as heart failure. Follow these instructions at home: Pay attention to any changes in your symptoms. Take these actions to help with your discomfort:  Take medicines only as told by your health care provider.  If you were prescribed an antibiotic medicine, take it as told by your health care provider. Do not stop taking the antibiotic even if you start to feel better.  Talk with your health care provider before you take a cough suppressant medicine.  Drink enough fluid to keep your urine clear or pale yellow.  If the air is dry, use a cold steam vaporizer or humidifier in your bedroom or your home to help loosen secretions.  Avoid anything that causes you to cough at work or at home.  If your cough is worse at night, try sleeping in a semi-upright position.  Avoid cigarette smoke. If you smoke, quit  smoking. If you need help quitting, ask your health care provider.  Avoid caffeine.  Avoid alcohol.  Rest as needed. Contact a health care provider if:  You have new symptoms.  You cough up pus.  Your cough does not get better after 2-3 weeks, or your cough gets worse.  You cannot control your cough with suppressant medicines and you are losing sleep.  You develop pain that is getting worse or pain that is not controlled with pain medicines.  You have a fever.  You have unexplained weight loss.  You have night sweats. Get help right away if:  You cough up blood.  You have difficulty breathing.  Your heartbeat is very fast. This information is not intended to replace advice given to you by your health care provider. Make sure you discuss any questions you have with your health care provider. Document Released: 05/29/2011 Document Revised: 05/07/2016 Document Reviewed: 02/06/2015 Elsevier Interactive Patient Education  2017 [employer].     IF you received an x-ray today, you will receive an invoice from [facility]. Please contact [facility] at [phone] with questions or concerns regarding your invoice.   IF you received labwork today, you will receive an invoice from [facility]. Please contact [facility] at [phone] with questions or concerns regarding your invoice.   Our billing staff will not be able to assist you with questions regarding bills from these companies.  You will be contacted with the lab results as soon  as they are available. The fastest way to get your results is to activate your My Chart account. Instructions are located on the last page of this paperwork. If you have not heard from [place] regarding the results in 2 weeks, please contact this office.

## 2016-12-02 NOTE — Progress Notes (Addendum)
    MRN: [id number] DOB: 1956/10/15  Subjective:   Nicole Reyes is a 60 y.o. female presenting for chief complaint of Laryngitis; Shortness of Breath; and Sinusitis  Reports ~2 week history of worsening productive cough, hoarseness, scratchy throat, sweating, subjective fever. Cough is worse at night, has caused soreness of her belly/ribs. She has also felt fatigue, shortness of breath. Has been using Symbicort twice daily, has not used albuterol. Also tried Mucinex with minimal relief. Has history of allergies, managed with cetirizine. Denies chest pain, n/v, abdominal pain, rashes, body aches, sinus pain, ear pain. Denies smoking cigarettes.   Nicole Reyes has a current medication list which includes the following prescription(s): amlodipine, aspirin, epinephrine, hydroxyzine, proair hfa, ranitidine, symbicort, and triamcinolone cream, and the following Facility-Administered Medications: cetirizine. Also is allergic to peanut-containing drug products.  Nicole Reyes  has a past medical history of Allergy; Anxiety; Asthma; and Hypertension. Also  has a past surgical history that includes laparoscopy; Dilation and curettage of uterus; COLONOSCOPY&ENDOSCOPY (1980's); and Colonoscopy (06/29/2011).   Objective:   Vitals: BP 128/80 (BP Location: Right Arm, Patient Position: Sitting, Cuff Size: Normal)   Pulse 90   Temp 98.3 F (36.8 C) (Oral)   Resp 17   Ht 5' 9.5" (1.765 m)   Wt 223 lb (101.2 kg)   SpO2 99%   BMI 32.46 kg/m   Peak flow reading is 350L/min, about ~83% of predicted prior to nebulizer treatment.   Physical Exam  Constitutional: She is oriented to person, place, and time. She appears well-developed and well-nourished.  HENT:  TM's intact bilaterally, no effusions or erythema. Nasal turbinates pink and moist, nasal passages patent. No sinus tenderness. Oropharynx clear, mucous membranes moist, dentition in good repair.  Eyes: Right eye exhibits no discharge. Left eye exhibits no  discharge.  Neck: Normal range of motion. Neck supple.  Cardiovascular: Normal rate, regular rhythm and intact distal pulses.  Exam reveals no gallop and no friction rub.   No murmur heard. Pulmonary/Chest: No respiratory distress. She has wheezes (R>L with associated coarse lung sounds mid-upper lung fields bilaterally). She has no rales.  Lymphadenopathy:    She has no cervical adenopathy.  Neurological: She is alert and oriented to person, place, and time.  Skin: Skin is warm and dry.   Assessment and Plan :   1. Lower respiratory infection 2. Cough 3. Extrinsic asthma without complication, unspecified asthma severity, unspecified whether persistent - Start azithromycin to cover for infectious process. Schedule albuterol inhaler. Use cough suppression medications. Restart cetirizine. RTC in 3 days if no improvement. Consider chest x-ray, steroid course at that point. Patient is in agreement.  Jaynee Eagles, PA-C [facility] [phone] 12/02/2016 4:06 PM

## 2016-12-08 ENCOUNTER — Ambulatory Visit (INDEPENDENT_AMBULATORY_CARE_PROVIDER_SITE_OTHER): Payer: Managed Care, Other (non HMO) | Admitting: Physician Assistant

## 2016-12-08 VITALS — BP 142/88 | HR 84 | Temp 97.5°F | Resp 18 | Ht 69.5 in | Wt 226.0 lb

## 2016-12-08 DIAGNOSIS — R062 Wheezing: Secondary | ICD-10-CM

## 2016-12-08 DIAGNOSIS — J4541 Moderate persistent asthma with (acute) exacerbation: Secondary | ICD-10-CM | POA: Diagnosis not present

## 2016-12-08 MED ORDER — ALBUTEROL SULFATE (2.5 MG/3ML) 0.083% IN NEBU
2.5000 mg | INHALATION_SOLUTION | RESPIRATORY_TRACT | Status: AC
Start: 2016-12-08 — End: 2016-12-08
  Administered 2016-12-08: 2.5 mg via RESPIRATORY_TRACT

## 2016-12-08 MED ORDER — IPRATROPIUM BROMIDE 0.02 % IN SOLN
0.5000 mg | Freq: Once | RESPIRATORY_TRACT | Status: AC
  Administered 2016-12-08: 0.5 mg via RESPIRATORY_TRACT

## 2016-12-08 MED ORDER — PREDNISONE 20 MG PO TABS: ORAL_TABLET | ORAL | 0 refills | Status: AC

## 2016-12-08 NOTE — Progress Notes (Signed)
12/08/2016 11:13 AM   DOB: 23-Jun-1956 / MRN: [id number]  SUBJECTIVE:  Nicole Reyes is a 60 y.o. female presenting for cough and continued congestion.  Was seen 6 days ago and felt that she was improving however about 3 days ago she began to feel worse.    She denies a history of diabetes.  Recently completed Z-pack 3 days ago.   She is allergic to peanut-containing drug products.   She  has a past medical history of Allergy; Anxiety; Asthma; and Hypertension.    She  reports that she has never smoked. She has never used smokeless tobacco. She reports that she does not drink alcohol or use drugs. She  reports that she currently engages in sexual activity. She reports using the following method of birth control/protection: Post-menopausal. The patient  has a past surgical history that includes laparoscopy; Dilation and curettage of uterus; COLONOSCOPY&ENDOSCOPY (1980's); and Colonoscopy (06/29/2011).  Her family history includes Alcohol abuse in her father; Asthma in her mother; Heart disease (age of onset: 6) in her father; Hypertension in her brother, brother, mother, sister, sister, sister, and sister; Stroke (age of onset: 66) in her father.  Review of Systems  Constitutional: Negative for chills and fever.  Respiratory: Positive for cough, shortness of breath and wheezing. Negative for sputum production.   Cardiovascular: Negative for chest pain and leg swelling.  Skin: Negative for rash.  Neurological: Negative for dizziness.    The problem list and medications were reviewed and updated by myself where necessary and exist elsewhere in the encounter.   OBJECTIVE:  BP (!) 142/88 (BP Location: Right Arm, Patient Position: Sitting, Cuff Size: Small)   Pulse 84   Temp 97.5 F (36.4 C) (Oral)   Resp 18   Ht 5' 9.5" (1.765 m)   Wt 226 lb (102.5 kg)   SpO2 98%   BMI 32.90 kg/m   Physical Exam  Constitutional: She is oriented to person, place, and time.  Cardiovascular:  Normal rate.   No murmur heard. Pulmonary/Chest: Effort normal and breath sounds normal.  Musculoskeletal: Normal range of motion.  Neurological: She is alert and oriented to person, place, and time.  Skin: Skin is warm and dry.   Lab Results  Component Value Date   HGBA1C 5.2 01/25/2015     No results found for this or any previous visit (from the past 72 hour(s)).  No results found.  ASSESSMENT AND PLAN  Nicole Reyes was seen today for follow-up, cough and sore throat.  Diagnoses and all orders for this visit:  Moderate persistent asthma with acute exacerbation: She has completed abx and has not improved. Given problem 2 and history will start prednisone. RTC as needed.  -     predniSONE (DELTASONE) 20 MG tablet; Take 3 in the morning for 3 days, then 2 in the morning for 3 days, and then 1 in the morning for 3 days.  Wheezing -     albuterol (PROVENTIL) (2.5 MG/3ML) 0.083% nebulizer solution 2.5 mg; Take 3 mLs (2.5 mg total) by nebulization now. -     ipratropium (ATROVENT) nebulizer solution 0.5 mg; Take 2.5 mLs (0.5 mg total) by nebulization once.    The patient is advised to call or return to clinic if she does not see an improvement in symptoms, or to seek the care of the closest emergency department if she worsens with the above plan.   Philis Fendt, MHS, PA-C [facility] 12/08/2016 11:13  AM

## 2017-01-03 ENCOUNTER — Other Ambulatory Visit: Payer: Self-pay | Admitting: Family Medicine

## 2017-01-03 DIAGNOSIS — I10 Essential (primary) hypertension: Secondary | ICD-10-CM

## 2017-01-04 ENCOUNTER — Other Ambulatory Visit: Payer: Self-pay | Admitting: Emergency Medicine

## 2017-01-04 DIAGNOSIS — I10 Essential (primary) hypertension: Secondary | ICD-10-CM

## 2017-01-04 MED ORDER — AMLODIPINE BESYLATE 10 MG PO TABS: ORAL_TABLET | ORAL | 0 refills | Status: DC

## 2017-01-22 ENCOUNTER — Other Ambulatory Visit: Payer: Self-pay | Admitting: Family Medicine

## 2017-02-12 ENCOUNTER — Ambulatory Visit (INDEPENDENT_AMBULATORY_CARE_PROVIDER_SITE_OTHER): Payer: Managed Care, Other (non HMO) | Admitting: Family Medicine

## 2017-02-12 VITALS — BP 122/72 | HR 90 | Temp 98.3°F | Resp 17 | Ht 66.0 in | Wt 221.0 lb

## 2017-02-12 DIAGNOSIS — I10 Essential (primary) hypertension: Secondary | ICD-10-CM | POA: Diagnosis not present

## 2017-02-12 DIAGNOSIS — Z131 Encounter for screening for diabetes mellitus: Secondary | ICD-10-CM | POA: Diagnosis not present

## 2017-02-12 DIAGNOSIS — R21 Rash and other nonspecific skin eruption: Secondary | ICD-10-CM | POA: Diagnosis not present

## 2017-02-12 DIAGNOSIS — Z0000 Encounter for general adult medical examination without abnormal findings: Secondary | ICD-10-CM | POA: Diagnosis not present

## 2017-02-12 MED ORDER — CLOTRIMAZOLE-BETAMETHASONE 1-0.05 % EX CREA: 1.0000 "application " | TOPICAL_CREAM | Freq: Two times a day (BID) | CUTANEOUS | 2 refills | Status: DC

## 2017-02-12 MED ORDER — BUDESONIDE-FORMOTEROL FUMARATE 80-4.5 MCG/ACT IN AERO: 2.0000 | INHALATION_SPRAY | Freq: Two times a day (BID) | RESPIRATORY_TRACT | 1 refills | Status: DC

## 2017-02-12 MED ORDER — NYSTATIN POWD
3 refills | Status: DC
Start: 2017-02-12 — End: 2018-01-23

## 2017-02-12 MED ORDER — AMLODIPINE BESYLATE 10 MG PO TABS: ORAL_TABLET | ORAL | 3 refills | Status: DC

## 2017-02-12 NOTE — Progress Notes (Addendum)
Nicole Reyes  MRN: [id number] DOB: 27-Sep-1956  Subjective:  Nicole Reyes is a 61 y.o. female who presents for annual physical exam and  hypertension follow-up. 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 Chronic problems include: Hypertension  Last dental exam:  None within the last 12 months   Last vision exam: reading glasses. Eye exam annually Last colonoscopy: Normal colonoscopy in 2012 Vaccinations: up to date   Request employment biometric exam form to be completed. Denies any other acute complaints or concerns.      Patient Active Problem List   Diagnosis Date Noted  . Essential hypertension 01/28/2016  . 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 Obesity 01/25/2015  . Pure hypercholesterolemia 02/10/2014  . Asthma 12/19/2012  . HTN (hypertension) 12/19/2012    Current Outpatient Prescriptions on File Prior to Visit  Medication Sig Dispense Refill  . amLODipine (NORVASC) 10 MG tablet TAKE 1 TABLET BY MOUTH EVERY DAY 90 tablet 0  . aspirin 81 MG tablet Take 81 mg by mouth daily.      . benzonatate (TESSALON) 100 MG capsule Take 1-2 capsules (100-200 mg total) by mouth 3 (three) times daily as needed for cough. 40 capsule 0  . EPINEPHrine 0.3 mg/0.3 mL IJ SOAJ injection Inject 0.3 mLs (0.3 mg total) into the muscle once. Use if needed for allergic reaction emergency.  Ok to sub generic device 1 Device 1  . HYDROcodone-homatropine (HYCODAN) 5-1.5 MG/5ML syrup Take 5 mLs by mouth at bedtime as needed. 100 mL 0  . hydrOXYzine (ATARAX/VISTARIL) 25 MG tablet Take 0.5-1 tablets (12.5-25 mg total) by mouth every 8 (eight) hours as needed for itching. 30 tablet 3  . PROAIR HFA 108 (90 Base) MCG/ACT inhaler INHALE 2 PUFFS INTO THE LUNGS EVERY 6 (SIX) HOURS AS NEEDED FOR WHEEZING. 25 Inhaler 2  . ranitidine (ZANTAC) 150 MG tablet Take 1 tablet (150 mg total) by mouth 2 (two) times daily. 60 tablet 0  . SYMBICORT 80-4.5 MCG/ACT inhaler  INHALE 2 PUFFS TWICE A DAY 10.2 Inhaler 1  . triamcinolone cream (KENALOG) 0.1 % APPLY TO AFFECTED AREA TWICE A DAY (NOT TO FACE, GENITALS, OR AXILLES) 45 g 9   Current Facility-Administered Medications on File Prior to Visit  Medication Dose Route Frequency Provider Last Rate Last Dose  . cetirizine (ZYRTEC) tablet 5 mg  5 mg Oral Daily Darreld Mclean, MD        Allergies  Allergen Reactions  . Peanut-Containing Drug Products     Also walnuts and almonds     Social History   Social History  . Marital status: Single    Spouse name: n/a  . Number of children: 0  . Years of education: N/A   Occupational History  . employed Museum/gallery curator   Social History Main Topics  . Smoking status: Never Smoker  . Smokeless tobacco: Never Used  . Alcohol use No  . Drug use: No  . Sexual activity: Yes    Birth control/ protection: Post-menopausal   Other Topics Concern  . 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      Tobacco: none      Alcohol: none      Exercise:  Four days per week.    Past Surgical  History:  Procedure Laterality Date  . COLONOSCOPY  06/29/2011   Normal  . COLONOSCOPY&ENDOSCOPY  1980's   PT UNSURE PLACE&MD WHO DID EXAMS=NORMAL  . DILATION AND CURETTAGE OF UTERUS    . LAPAROSCOPY     REMOVED SCARE TISSUE FROM OVARIES    Family History  Problem Relation Age of Onset  . Alcohol abuse Father   . Heart disease Father 3    AMI age 46  . Stroke Father 39    CVA  . Hypertension Mother   . Asthma Mother   . Hypertension Sister   . Hypertension Brother   . Hypertension Sister   . Hypertension Sister   . Hypertension Sister   . Hypertension Brother   . Colon cancer Neg Hx    Review of Systems   See HPI Objective:  BP 122/72 (BP Location: Right Arm, Patient Position: Sitting, Cuff Size: Normal)   Pulse 90   Temp 98.3 F (36.8 C)  (Oral)   Resp 17   Ht 5\' 6"  (1.676 m)   Wt 221 lb (100.2 kg)   SpO2 97%   BMI 35.67 kg/m   Physical Exam  Constitutional: She is oriented to person, place, and time and well-developed, well-nourished, and in no distress.  HENT:  Head: Normocephalic and atraumatic.  Right Ear: External ear normal.  Left Ear: External ear normal.  Nose: Nose normal.  Mouth/Throat: Oropharynx is clear and moist.  Eyes: Conjunctivae are normal. Pupils are equal, round, and reactive to light.  Neck: Normal range of motion. Neck supple. No thyromegaly present.  Cardiovascular: Normal rate, regular rhythm, normal heart sounds and intact distal pulses.   No murmur heard. Pulmonary/Chest: Effort normal and breath sounds normal.  Abdominal: Soft. Bowel sounds are normal. She exhibits no distension. There is no tenderness. There is no rebound and no guarding.  Musculoskeletal: Normal range of motion.  Lymphadenopathy:    She has no cervical adenopathy.  Neurological: She is alert and oriented to person, place, and time. Gait normal. GCS score is 15.  Skin: Skin is warm and dry.  Psychiatric: Mood, memory, affect and judgment normal.      Visual Acuity Screening   Right eye Left eye Both eyes  Without correction: 20/40 20/40 20/40   With correction:      Vision is corrected with reading glasses.  Assessment and Plan :  Discussed healthy lifestyle, diet, exercise, preventative care, vaccinations, and addressed patient's concerns. Plan for follow up in 12 months. Otherwise, plan for specific conditions below.  1. Annual physical exam Age-appropriate anticipatory guidance   2. Essential hypertension, Controlled -Continue Amlodipine 10 mg once daily   3. Screening for diabetes mellitus  4. Rash, recurrent skin rash under the breast Plan: -Nystatin Powder and Lotrisone cream for recurrent skin rash.  Return for follow as needed.  Carroll Sage. Kenton Kingfisher, MSN, FNP-C [facility] [facility]

## 2017-02-12 NOTE — Patient Instructions (Addendum)
You may pick up your form on Tuesday after 1200 pm.  I have ordered Nystatin Powder and Lotrisone cream for recurrent skin rash.  You will be notified of your labs.   IF you received an x-ray today, you will receive an invoice from [facility]. Please contact [facility] at [phone] with questions or concerns regarding your invoice.   IF you received labwork today, you will receive an invoice from [facility]. Please contact [facility] at [phone] with questions or concerns regarding your invoice.   Our billing staff will not be able to assist you with questions regarding bills from these companies.  You will be contacted with the lab results as soon as they are available. The fastest way to get your results is to activate your My Chart account. Instructions are located on the last page of this paperwork. If you have not heard from [place] regarding the results in 2 weeks, please contact this office.     Exercising to Stay Healthy Exercising regularly is important. It has many health benefits, such as:  Improving your overall fitness, flexibility, and endurance.  Increasing your bone density.  Helping with weight control.  Decreasing your body fat.  Increasing your muscle strength.  Reducing stress and tension.  Improving your overall health. In order to become healthy and stay healthy, it is recommended that you do moderate-intensity and vigorous-intensity exercise. You can tell that you are exercising at a moderate intensity if you have a higher heart rate and faster breathing, but you are still able to hold a conversation. You can tell that you are exercising at a vigorous intensity if you are breathing much harder and faster and cannot hold a conversation while exercising. How often should I exercise? Choose an activity that you enjoy and set realistic goals. Your health care provider can help you to make an activity plan that works for you. Exercise regularly as  directed by your health care provider. This may include:  Doing resistance training twice each week, such as:  Push-ups.  Sit-ups.  Lifting weights.  Using resistance bands.  Doing a given intensity of exercise for a given amount of time. Choose from these options:  150 minutes of moderate-intensity exercise every week.  75 minutes of vigorous-intensity exercise every week.  A mix of moderate-intensity and vigorous-intensity exercise every week. Children, pregnant women, people who are out of shape, people who are overweight, and older adults may need to consult a health care provider for individual recommendations. If you have any sort of medical condition, be sure to consult your health care provider before starting a new exercise program. What are some exercise ideas? Some moderate-intensity exercise ideas include:  Walking at a rate of 1 mile in 15 minutes.  Biking.  Hiking.  Golfing.  Dancing. Some vigorous-intensity exercise ideas include:  Walking at a rate of at least 4.5 miles per hour.  Jogging or running at a rate of 5 miles per hour.  Biking at a rate of at least 10 miles per hour.  Lap swimming.  Roller-skating or in-line skating.  Cross-country skiing.  Vigorous competitive sports, such as football, basketball, and soccer.  Jumping rope.  Aerobic dancing. What are some everyday activities that can help me to get exercise?  Yard work, such as:  Psychologist, educational.  Raking and bagging leaves.  Washing and waxing your car.  Pushing a stroller.  Shoveling snow.  Gardening.  Washing windows or floors. How can I be more active in  my day-to-day activities?  Use the stairs instead of the elevator.  Take a walk during your lunch break.  If you drive, park your car farther away from work or school.  If you take public transportation, get off one stop early and walk the rest of the way.  Make all of your phone calls while standing up  and walking around.  Get up, stretch, and walk around every 30 minutes throughout the day. What guidelines should I follow while exercising?  Do not exercise so much that you hurt yourself, feel dizzy, or get very short of breath.  Consult your health care provider before starting a new exercise program.  Wear comfortable clothes and shoes with good support.  Drink plenty of water while you exercise to prevent dehydration or heat stroke. Body water is lost during exercise and must be replaced.  Work out until you breathe faster and your heart beats faster. This information is not intended to replace advice given to you by your health care provider. Make sure you discuss any questions you have with your health care provider. Document Released: 01/02/2011 Document Revised: 05/07/2016 Document Reviewed: 05/03/2014 Elsevier Interactive Patient Education  2017 [employer].

## 2017-02-13 LAB — CMP14+EGFR
ALT: 37 IU/L — AB (ref 0–32)
AST: 38 IU/L (ref 0–40)
Albumin/Globulin Ratio: 1.5 (ref 1.2–2.2)
Albumin: 4.5 g/dL (ref 3.6–4.8)
Alkaline Phosphatase: 129 IU/L — ABNORMAL HIGH (ref 39–117)
BUN/Creatinine Ratio: 15 (ref 12–28)
BUN: 12 mg/dL (ref 8–27)
Bilirubin Total: 0.6 mg/dL (ref 0.0–1.2)
CALCIUM: 9.6 mg/dL (ref 8.7–10.3)
CHLORIDE: 102 mmol/L (ref 96–106)
CO2: 22 mmol/L (ref 18–29)
Creatinine, Ser: 0.8 mg/dL (ref 0.57–1.00)
GFR, EST AFRICAN AMERICAN: 93 mL/min/{1.73_m2} (ref >59)
GFR, EST NON AFRICAN AMERICAN: 80 mL/min/{1.73_m2} (ref >59)
GLUCOSE: 94 mg/dL (ref 65–99)
Globulin, Total: 3 g/dL (ref 1.5–4.5)
POTASSIUM: 4.1 mmol/L (ref 3.5–5.2)
Sodium: 139 mmol/L (ref 134–144)
TOTAL PROTEIN: 7.5 g/dL (ref 6.0–8.5)

## 2017-02-13 LAB — CBC WITH DIFFERENTIAL/PLATELET
Basophils Absolute: 0 10*3/uL (ref 0.0–0.2)
Basos: 1 %
EOS (ABSOLUTE): 0.4 10*3/uL (ref 0.0–0.4)
Eos: 5 %
Hematocrit: 39.4 % (ref 34.0–46.6)
Hemoglobin: 13.7 g/dL (ref 11.1–15.9)
IMMATURE GRANS (ABS): 0 10*3/uL (ref 0.0–0.1)
IMMATURE GRANULOCYTES: 0 %
LYMPHS: 28 %
Lymphocytes Absolute: 2 10*3/uL (ref 0.7–3.1)
MCH: 27.7 pg (ref 26.6–33.0)
MCHC: 34.8 g/dL (ref 31.5–35.7)
MCV: 80 fL (ref 79–97)
MONOS ABS: 0.4 10*3/uL (ref 0.1–0.9)
Monocytes: 5 %
NEUTROS PCT: 61 %
Neutrophils Absolute: 4.4 10*3/uL (ref 1.4–7.0)
PLATELETS: 335 10*3/uL (ref 150–379)
RBC: 4.95 x10E6/uL (ref 3.77–5.28)
RDW: 13.1 % (ref 12.3–15.4)
WBC: 7.2 10*3/uL (ref 3.4–10.8)

## 2017-02-13 LAB — THYROID PANEL WITH TSH
FREE THYROXINE INDEX: 1.6 (ref 1.2–4.9)
T3 UPTAKE RATIO: 31 % (ref 24–39)
T4 TOTAL: 5.1 ug/dL (ref 4.5–12.0)
TSH: 1.22 u[IU]/mL (ref 0.450–4.500)

## 2017-02-13 LAB — HIV ANTIBODY (ROUTINE TESTING W REFLEX): HIV SCREEN 4TH GENERATION: NONREACTIVE

## 2017-02-13 LAB — HEPATITIS C ANTIBODY: Hep C Virus Ab: <0.1 s/co ratio (ref 0.0–0.9)

## 2017-02-14 LAB — HEMOGLOBIN A1C
ESTIMATED AVERAGE GLUCOSE: 103 mg/dL
HEMOGLOBIN A1C: 5.2 % (ref 4.8–5.6)

## 2017-02-17 ENCOUNTER — Telehealth: Payer: Self-pay | Admitting: Emergency Medicine

## 2017-02-17 ENCOUNTER — Encounter: Payer: Self-pay | Admitting: Family Medicine

## 2017-02-17 NOTE — Telephone Encounter (Signed)
Let message Preventative Care forms are at front desk for pick up

## 2017-03-10 LAB — LIPID PANEL
CHOL/HDL RATIO: 4.7 ratio — AB (ref 0.0–4.4)
Cholesterol, Total: 206 mg/dL — ABNORMAL HIGH (ref 100–199)
HDL: 44 mg/dL (ref >39)
LDL Calculated: 135 mg/dL — ABNORMAL HIGH (ref 0–99)
TRIGLYCERIDES: 136 mg/dL (ref 0–149)
VLDL Cholesterol Cal: 27 mg/dL (ref 5–40)

## 2017-03-10 LAB — SPECIMEN STATUS REPORT

## 2017-04-30 ENCOUNTER — Other Ambulatory Visit: Payer: Self-pay | Admitting: Physician Assistant

## 2017-06-13 ENCOUNTER — Telehealth: Payer: Self-pay | Admitting: Physician Assistant

## 2017-06-17 ENCOUNTER — Telehealth: Payer: Self-pay | Admitting: Family Medicine

## 2017-06-17 NOTE — Telephone Encounter (Signed)
SPOKE WITH PT SHE STATES THAT WHEN SHE NEED ANOTHER REFILL SHE WILL CALL us TO SET UP AN OV PLUS PT STATES THAT THE SYMBICORT COST HER $72 IF WE CAN CALL HER IN SOMETHING CHEAPER

## 2017-06-17 NOTE — Telephone Encounter (Signed)
SPOKE WITH PT SHE STATES THAT WHEN SHE NEED ANOTHER REFILL ON HER INHALER SHE WILL CALL us TO SET UP AN OV

## 2017-06-17 NOTE — Telephone Encounter (Signed)
Please advise 

## 2017-06-18 ENCOUNTER — Other Ambulatory Visit: Payer: Self-pay | Admitting: Physician Assistant

## 2017-06-18 MED ORDER — BUDESONIDE-FORMOTEROL FUMARATE 80-4.5 MCG/ACT IN AERO: 2.0000 | INHALATION_SPRAY | Freq: Two times a day (BID) | RESPIRATORY_TRACT | 0 refills | Status: DC

## 2017-06-18 NOTE — Telephone Encounter (Signed)
I've ordered 1 refill.  She is due back. Please call to schedule her with myself or PA [facility]. Philis Fendt, MS, PA-C 3:56 PM, 06/18/2017

## 2017-06-19 ENCOUNTER — Telehealth: Payer: Self-pay | Admitting: Family Medicine

## 2017-06-19 NOTE — Telephone Encounter (Signed)
lmom to call and make an appointment for more refills on Symbicort this was our second attempt to schedule an OV with this patient if pt call please advise

## 2017-07-02 ENCOUNTER — Other Ambulatory Visit: Payer: Self-pay | Admitting: Urgent Care

## 2017-07-02 ENCOUNTER — Other Ambulatory Visit: Payer: Self-pay | Admitting: Family Medicine

## 2017-09-14 ENCOUNTER — Other Ambulatory Visit: Payer: Self-pay | Admitting: Physician Assistant

## 2017-10-02 ENCOUNTER — Encounter: Payer: Self-pay | Admitting: Physician Assistant

## 2017-10-02 ENCOUNTER — Ambulatory Visit (INDEPENDENT_AMBULATORY_CARE_PROVIDER_SITE_OTHER): Payer: Managed Care, Other (non HMO) | Admitting: Physician Assistant

## 2017-10-02 VITALS — BP 120/82 | HR 80 | Temp 97.9°F | Resp 16 | Ht 66.5 in | Wt 213.8 lb

## 2017-10-02 DIAGNOSIS — Z9189 Other specified personal risk factors, not elsewhere classified: Secondary | ICD-10-CM

## 2017-10-02 DIAGNOSIS — Z889 Allergy status to unspecified drugs, medicaments and biological substances status: Secondary | ICD-10-CM

## 2017-10-02 DIAGNOSIS — J45909 Unspecified asthma, uncomplicated: Secondary | ICD-10-CM

## 2017-10-02 DIAGNOSIS — L500 Allergic urticaria: Secondary | ICD-10-CM

## 2017-10-02 DIAGNOSIS — Z23 Encounter for immunization: Secondary | ICD-10-CM | POA: Diagnosis not present

## 2017-10-02 MED ORDER — CETIRIZINE HCL 10 MG PO TABS: 10.0000 mg | ORAL_TABLET | Freq: Every day | ORAL | 11 refills | Status: DC

## 2017-10-02 MED ORDER — HYDROXYZINE HCL 25 MG PO TABS: 12.5000 mg | ORAL_TABLET | Freq: Three times a day (TID) | ORAL | 5 refills | Status: DC | PRN

## 2017-10-02 MED ORDER — BUDESONIDE-FORMOTEROL FUMARATE 80-4.5 MCG/ACT IN AERO: 2.0000 | INHALATION_SPRAY | Freq: Two times a day (BID) | RESPIRATORY_TRACT | 11 refills | Status: DC

## 2017-10-02 NOTE — Progress Notes (Signed)
PRIMARY CARE AT [facility] [address] [place] [address] [phone]  Date:  10/02/2017   Name:  Nicole Reyes   DOB:  16-Sep-1956   MRN:  [id number]  PCP:  Darreld Mclean, MD    History of Present Illness:  Nicole Reyes is a 61 y.o. female patient who presents to PCP with  Chief Complaint  Patient presents with  . Asthma  . Flu Vaccine     Symbicort: twice per day.  She has not used it for months.    No coughing.  No gasping for breath at nighttime.   She has some bumps that may cojme out and pruritic.  She will use the vistaril to ease this.  She found the triggers around her work.  She works with Software engineer.  It is also around the heat.  After spotting marks in the back of her throat.  Pt denies any throat pain.  She has been sucking on peppermints, and may have hit the back of her mouth with her toothbrush.     Patient Active Problem List   Diagnosis Date Noted  . Essential hypertension 01/28/2016  . 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 Obesity 01/25/2015  . Pure hypercholesterolemia 02/10/2014  . Asthma 12/19/2012  . HTN (hypertension) 12/19/2012    Past Medical History:  Diagnosis Date  . Allergy   . Anxiety   . Asthma   . Hypertension     Past Surgical History:  Procedure Laterality Date  . COLONOSCOPY  06/29/2011   Normal  . COLONOSCOPY&ENDOSCOPY  1980's   PT UNSURE PLACE&MD WHO DID EXAMS=NORMAL  . DILATION AND CURETTAGE OF UTERUS    . LAPAROSCOPY     REMOVED SCARE TISSUE FROM OVARIES    Social History  Substance Use Topics  . Smoking status: Never Smoker  . Smokeless tobacco: Never Used  . Alcohol use No    Family History  Problem Relation Age of Onset  . Alcohol abuse Father   . Heart disease Father 78       AMI age 35  . Stroke Father 49       CVA  . Hypertension Mother   . Asthma Mother   . Hypertension Sister   . Hypertension Brother   . Hypertension Sister   . Hypertension Sister   .  Hypertension Sister   . Hypertension Brother   . Colon cancer Neg Hx     Allergies  Allergen Reactions  . Shellfish Allergy Anaphylaxis  . Peanut-Containing Drug Products     Also walnuts and almonds     Medication list has been reviewed and updated.  Current Outpatient Prescriptions on File Prior to Visit  Medication Sig Dispense Refill  . amLODipine (NORVASC) 10 MG tablet TAKE 1 TABLET BY MOUTH EVERY DAY 90 tablet 3  . aspirin 81 MG tablet Take 81 mg by mouth daily.      . budesonide-formoterol (SYMBICORT) 80-4.5 MCG/ACT inhaler Inhale 2 puffs into the lungs 2 (two) times daily. 10.2 Inhaler 0  . clotrimazole-betamethasone (LOTRISONE) cream Apply 1 application topically 2 (two) times daily. 90 g 2  . hydrOXYzine (ATARAX/VISTARIL) 25 MG tablet Take 0.5-1 tablets (12.5-25 mg total) by mouth every 8 (eight) hours as needed for itching. 30 tablet 3  . Nystatin POWD Apply liberally under breast and panty line area prevent irritaiton 1 Bottle 3  . PROAIR HFA 108 (90 Base) MCG/ACT inhaler INHALE 2 PUFFS INTO THE LUNGS EVERY  6 (SIX) HOURS AS NEEDED FOR WHEEZING. 25 Inhaler 2  . triamcinolone cream (KENALOG) 0.1 % APPLY TO AFFECTED AREA TWICE A DAY (NOT TO FACE, GENITALS, OR AXILLES) 45 g 9  . EPINEPHrine 0.3 mg/0.3 mL IJ SOAJ injection Inject 0.3 mLs (0.3 mg total) into the muscle once. Use if needed for allergic reaction emergency.  Ok to sub generic device (Patient not taking: Reported on 10/02/2017) 1 Device 1   Current Facility-Administered Medications on File Prior to Visit  Medication Dose Route Frequency Provider Last Rate Last Dose  . cetirizine (ZYRTEC) tablet 5 mg  5 mg Oral Daily Copland, Jessica C, MD        ROS ROS otherwise unremarkable unless listed above.  Physical Examination: There were no vitals taken for this visit. Ideal Body Weight:    Physical Exam  Constitutional: She is oriented to person, place, and time. She appears well-developed and well-nourished. No  distress.  HENT:  Head: Normocephalic and atraumatic.  Right Ear: External ear normal.  Left Ear: External ear normal.  Mouth/Throat: Oropharynx is clear and moist. No oropharyngeal exudate, posterior oropharyngeal edema or posterior oropharyngeal erythema.    Eyes: Pupils are equal, round, and reactive to light. Conjunctivae and EOM are normal.  Cardiovascular: Normal rate.   Pulmonary/Chest: Effort normal. No respiratory distress.  Neurological: She is alert and oriented to person, place, and time.  Skin: She is not diaphoretic.  Psychiatric: She has a normal mood and affect. Her behavior is normal.     Assessment and Plan: Nicole Reyes is a 62 y.o. female who is here today for cc of  Chief Complaint  Patient presents with  . Asthma  . Flu Vaccine   H/O multiple allergies - Plan: cetirizine (ZYRTEC) 10 MG tablet, hydrOXYzine (ATARAX/VISTARIL) 25 MG tablet  Allergic urticaria - Plan: cetirizine (ZYRTEC) 10 MG tablet, hydrOXYzine (ATARAX/VISTARIL) 25 MG tablet  Extrinsic asthma without complication, unspecified asthma severity, unspecified whether persistent - Plan: budesonide-formoterol (SYMBICORT) 80-4.5 MCG/ACT inhaler  Need for immunization against influenza - Plan: Flu Vaccine QUAD 36+ mos IM  Ivar Drape, PA-C [facility] 10/20/20189:28 AM

## 2017-10-02 NOTE — Patient Instructions (Addendum)
Asthma, Adult Asthma is a condition of the lungs in which the airways tighten and narrow. Asthma can make it hard to breathe. Asthma cannot be cured, but medicine and lifestyle changes can help control it. Asthma may be started (triggered) by:  Animal skin flakes (dander).  Dust.  Cockroaches.  Pollen.  Mold.  Smoke.  Cleaning products.  Hair sprays or aerosol sprays.  Paint fumes or strong smells.  Cold air, weather changes, and winds.  Crying or laughing hard.  Stress.  Certain medicines or drugs.  Foods, such as dried fruit, potato chips, and sparkling grape juice.  Infections or conditions (colds, flu).  Exercise.  Certain medical conditions or diseases.  Exercise or tiring activities.  Follow these instructions at home:  Take medicine as told by your doctor.  Use a peak flow meter as told by your doctor. A peak flow meter is a tool that measures how well the lungs are working.  Record and keep track of the peak flow meter's readings.  Understand and use the asthma action plan. An asthma action plan is a written plan for taking care of your asthma and treating your attacks.  To help prevent asthma attacks: ? Do not smoke. Stay away from secondhand smoke. ? Change your heating and air conditioning filter often. ? Limit your use of fireplaces and wood stoves. ? Get rid of pests (such as roaches and mice) and their droppings. ? Throw away plants if you see mold on them. ? Clean your floors. Dust regularly. Use cleaning products that do not smell. ? Have someone vacuum when you are not home. Use a vacuum cleaner with a HEPA filter if possible. ? Replace carpet with wood, tile, or vinyl flooring. Carpet can trap animal skin flakes and dust. ? Use allergy-proof pillows, mattress covers, and box spring covers. ? Wash bed sheets and blankets every week in hot water and dry them in a dryer. ? Use blankets that are made of polyester or cotton. ? Clean bathrooms  and kitchens with bleach. If possible, have someone repaint the walls in these rooms with mold-resistant paint. Keep out of the rooms that are being cleaned and painted. ? Wash hands often. Contact a doctor if:  You have make a whistling sound when breaking (wheeze), have shortness of breath, or have a cough even if taking medicine to prevent attacks.  The colored mucus you cough up (sputum) is thicker than usual.  The colored mucus you cough up changes from clear or white to yellow, green, gray, or bloody.  You have problems from the medicine you are taking such as: ? A rash. ? Itching. ? Swelling. ? Trouble breathing.  You need reliever medicines more than 2-3 times a week.  Your peak flow measurement is still at 50-79% of your personal best after following the action plan for 1 hour.  You have a fever. Get help right away if:  You seem to be worse and are not responding to medicine during an asthma attack.  You are short of breath even at rest.  You get short of breath when doing very little activity.  You have trouble eating, drinking, or talking.  You have chest pain.  You have a fast heartbeat.  Your lips or fingernails start to turn blue.  You are light-headed, dizzy, or faint.  Your peak flow is less than 50% of your personal best. This information is not intended to replace advice given to you by your health care provider. Make   sure you discuss any questions you have with your health care provider. Document Released: 05/18/2008 Document Revised: 05/07/2016 Document Reviewed: 06/29/2013 Elsevier Interactive Patient Education  2017 [employer].     IF you received an x-ray today, you will receive an invoice from [facility]. Please contact [facility] at [phone] with questions or concerns regarding your invoice.   IF you received labwork today, you will receive an invoice from [place]. Please contact [facility] at [phone] with  questions or concerns regarding your invoice.   Our billing staff will not be able to assist you with questions regarding bills from these companies.  You will be contacted with the lab results as soon as they are available. The fastest way to get your results is to activate your My Chart account. Instructions are located on the last page of this paperwork. If you have not heard from [place] regarding the results in 2 weeks, please contact this office.

## 2017-11-20 ENCOUNTER — Encounter: Payer: Self-pay | Admitting: Physician Assistant

## 2017-11-20 DIAGNOSIS — H35419 Lattice degeneration of retina, unspecified eye: Secondary | ICD-10-CM | POA: Insufficient documentation

## 2017-11-20 DIAGNOSIS — H04129 Dry eye syndrome of unspecified lacrimal gland: Secondary | ICD-10-CM | POA: Insufficient documentation

## 2017-11-20 DIAGNOSIS — H269 Unspecified cataract: Secondary | ICD-10-CM | POA: Insufficient documentation

## 2018-01-12 ENCOUNTER — Telehealth: Payer: Self-pay | Admitting: Physician Assistant

## 2018-01-12 NOTE — Telephone Encounter (Signed)
Pt dropped off form to be completed by Ivar Drape for her Annual prevenitive results,pt had complete pe in March 2018   Pt request form be faxed to number on said form   Best phone for pt is [phone]

## 2018-01-13 NOTE — Telephone Encounter (Signed)
This was sent today.

## 2018-01-19 ENCOUNTER — Encounter: Payer: Self-pay | Admitting: Physician Assistant

## 2018-01-19 ENCOUNTER — Other Ambulatory Visit: Payer: Self-pay

## 2018-01-19 ENCOUNTER — Ambulatory Visit: Payer: Managed Care, Other (non HMO) | Admitting: Physician Assistant

## 2018-01-19 VITALS — BP 142/78 | HR 94 | Temp 98.7°F | Resp 18 | Ht 66.5 in | Wt 218.2 lb

## 2018-01-19 DIAGNOSIS — J019 Acute sinusitis, unspecified: Secondary | ICD-10-CM

## 2018-01-19 DIAGNOSIS — R21 Rash and other nonspecific skin eruption: Secondary | ICD-10-CM

## 2018-01-19 DIAGNOSIS — J358 Other chronic diseases of tonsils and adenoids: Secondary | ICD-10-CM

## 2018-01-19 MED ORDER — AZELASTINE HCL 0.1 % NA SOLN: 2.0000 | Freq: Two times a day (BID) | NASAL | 0 refills | Status: DC

## 2018-01-19 MED ORDER — GUAIFENESIN ER 1200 MG PO TB12: 1.0000 | ORAL_TABLET | Freq: Two times a day (BID) | ORAL | 1 refills | Status: DC | PRN

## 2018-01-19 MED ORDER — AMOXICILLIN-POT CLAVULANATE 875-125 MG PO TABS: 1.0000 | ORAL_TABLET | Freq: Two times a day (BID) | ORAL | 0 refills | Status: DC

## 2018-01-19 MED ORDER — BENZONATATE 100 MG PO CAPS: 100.0000 mg | ORAL_CAPSULE | Freq: Three times a day (TID) | ORAL | 0 refills | Status: DC | PRN

## 2018-01-19 NOTE — Progress Notes (Signed)
Patient ID: Nicole Reyes, female    DOB: 04-21-1956, 62 y.o.   MRN: [id number]  PCP: Joretta Bachelor, PA  Chief Complaint  Patient presents with  . Sinus Problem    dry scratchy throat,blood coming out of nose when blowing, and right side pain  x1 week     Subjective:   Presents for evaluation of possible sinusitis.  Symptoms began 2 weeks ago with sinus congestion, scratchy throat, hoarseness, cough.  She thought it was a cold at first, but then developed blood-tinged nasal sputum, ear fullness, fatigue and subjective fever/chills. Cough is non-productive. Mucinex has not been adequately helpful. Numerous sick contacts at work.  Chest wall pain briefly yesterday after lifting aheavy item, now resolved. No CP, SOB, HA, dizziness. No nausea, vomiting or diarrhea.  Recurrent rash under the breasts. Not present today. Requests refills of Lotrisone, Nystatin powder and triamco=inolonce cream for PRN use.  Review of Systems As above.    Patient Active Problem List   Diagnosis Date Noted  . 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 Dry eye syndrome 11/20/2017  . Cataracts, bilateral 11/20/2017  . Essential hypertension 01/28/2016  . 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 Obesity 01/25/2015  . Pure hypercholesterolemia 02/10/2014  . Asthma 12/19/2012  . HTN (hypertension) 12/19/2012     Prior to Admission medications   Medication Sig Start Date End Date Taking? Authorizing Provider  amLODipine (NORVASC) 10 MG tablet TAKE 1 TABLET BY MOUTH EVERY DAY 02/12/17  Yes Scot Jun, FNP  aspirin 81 MG tablet Take 81 mg by mouth daily.     Yes [provider]  budesonide-formoterol (SYMBICORT) 80-4.5 MCG/ACT inhaler Inhale 2 puffs into the lungs 2 (two) times daily. 10/02/17  Yes English, Colletta Maryland D, PA  cetirizine (ZYRTEC) 10 MG tablet Take 1 tablet (10 mg total) by mouth daily. 10/02/17  Yes English, Colletta Maryland D, PA    clotrimazole-betamethasone (LOTRISONE) cream Apply 1 application topically 2 (two) times daily. 02/12/17  Yes Scot Jun, FNP  EPINEPHrine 0.3 mg/0.3 mL IJ SOAJ injection Inject 0.3 mLs (0.3 mg total) into the muscle once. Use if needed for allergic reaction emergency.  Ok to sub generic device 01/28/16  Yes Copland, Gay Filler, MD  hydrOXYzine (ATARAX/VISTARIL) 25 MG tablet Take 0.5-1 tablets (12.5-25 mg total) by mouth every 8 (eight) hours as needed for itching. 10/02/17  Yes Ivar Drape D, PA  Nystatin POWD Apply liberally under breast and panty line area prevent irritaiton 02/12/17  Yes Scot Jun, FNP  PROAIR HFA 108 [phone] Base) MCG/ACT inhaler INHALE 2 PUFFS INTO THE LUNGS EVERY 6 (SIX) HOURS AS NEEDED FOR WHEEZING. 01/08/16  Yes Wardell Honour, MD  triamcinolone cream (KENALOG) 0.1 % APPLY TO AFFECTED AREA TWICE A DAY (NOT TO FACE, GENITALS, OR AXILLES) 07/03/16  Yes Jaynee Eagles, PA-C  traMADol (ULTRAM) 50 MG tablet TAKE 1 TABLET BY MOUTH AT BEDTIME AS NEEDED FOR PAIN 12/09/17   [provider]     Allergies  Allergen Reactions  . Shellfish Allergy Anaphylaxis  . Peanut-Containing Drug Products     Also walnuts and almonds        Objective:  Physical Exam  Constitutional: She is oriented to person, place, and time. She appears well-developed and well-nourished. No distress.  BP (!) 142/78   Pulse 94   Temp 98.7 F (37.1 C) (Oral)   Resp 18   Ht 5' 6.5" (1.689 m)  Wt 218 lb 3.2 oz (99 kg)   SpO2 100%   BMI 34.69 kg/m    HENT:  Head: Normocephalic and atraumatic.  Right Ear: Hearing, tympanic membrane, external ear and ear canal normal.  Left Ear: Hearing, tympanic membrane, external ear and ear canal normal.  Nose: Mucosal edema and rhinorrhea present.  No foreign bodies. Right sinus exhibits maxillary sinus tenderness and frontal sinus tenderness. Left sinus exhibits maxillary sinus tenderness and frontal sinus tenderness.  Mouth/Throat: Uvula is  midline, oropharynx is clear and moist and mucous membranes are normal. No uvula swelling. No oropharyngeal exudate.    Eyes: Conjunctivae and EOM are normal. Pupils are equal, round, and reactive to light. Right eye exhibits no discharge. Left eye exhibits no discharge. No scleral icterus.  Neck: Trachea normal, normal range of motion and full passive range of motion without pain. Neck supple. No thyroid mass and no thyromegaly present.  Cardiovascular: Normal rate, regular rhythm and normal heart sounds.  Pulmonary/Chest: Effort normal and breath sounds normal.  Lymphadenopathy:       Head (right side): No submandibular, no tonsillar, no preauricular, no posterior auricular and no occipital adenopathy present.       Head (left side): No submandibular, no tonsillar, no preauricular and no occipital adenopathy present.    She has no cervical adenopathy.       Right: No supraclavicular adenopathy present.       Left: No supraclavicular adenopathy present.  Neurological: She is alert and oriented to person, place, and time. She has normal strength. No cranial nerve deficit or sensory deficit.  Skin: Skin is warm, dry and intact. No rash noted.  Psychiatric: She has a normal mood and affect. Her speech is normal and behavior is normal.           Assessment & Plan:   Problem List Items Addressed This Visit    None    Visit Diagnoses    Acute sinusitis, recurrence not specified, unspecified location    -  Primary   Treat as bacterial etiology. Supporitve care. Anticipatory guidance.   Relevant Medications   amoxicillin-clavulanate (AUGMENTIN) 875-125 MG tablet   azelastine (ASTELIN) 0.1 % nasal spray   benzonatate (TESSALON) 100 MG capsule   Guaifenesin (MUCINEX MAXIMUM STRENGTH) 1200 MG TB12   Tonsillolith       Anticipatory guidance provided.   Rash and nonspecific skin eruption       PRN use of steroid, antifungal products previously effective.   Relevant Medications    triamcinolone cream (KENALOG) 0.1 %   Nystatin POWD   clotrimazole-betamethasone (LOTRISONE) cream       Return if symptoms worsen or fail to improve.   Fara Chute, PA-C [facility] at [place]

## 2018-01-19 NOTE — Progress Notes (Signed)
Subjective:    Patient ID: Nicole Reyes, female    DOB: 1956/05/22, 62 y.o.   MRN: [id number]  Chief Complaint  Patient presents with  . Sinus Problem    dry scratchy throat,blood coming out of nose when blowing, and right side pain  x1 week    2 weeks ago started having sinus congestion, itchy ears, and scratchy throat.  Thought it was just a cold. Ignored it until 6 days ago when symptoms worsened.  Dry cough, dry throat, hoarseness, yellow blood tinged sputum with blowing nose, ear fullness fatigue, and feels hot/cold.  Has only taken Mucinex.  Multiple sick contacts at work.   Denies: headache, dizziness, nausea, vomiting, diarrhea, productive cough, shortness of breath, wheezing, or chest pain  Yesterday when picking up a heavy item at work, she felt pain on right right lateral side. Has not noticed the pain today. Notes it must have just gone away.   Review of Systems As above.  Patient Active Problem List   Diagnosis Date Noted  . 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 Dry eye syndrome 11/20/2017  . Cataracts, bilateral 11/20/2017  . Essential hypertension 01/28/2016  . 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 Obesity 01/25/2015  . Pure hypercholesterolemia 02/10/2014  . Asthma 12/19/2012  . HTN (hypertension) 12/19/2012   Prior to Admission medications   Medication Sig Start Date End Date Taking? Authorizing Provider  amLODipine (NORVASC) 10 MG tablet TAKE 1 TABLET BY MOUTH EVERY DAY 02/12/17  Yes Scot Jun, FNP  aspirin 81 MG tablet Take 81 mg by mouth daily.     Yes [provider]  budesonide-formoterol (SYMBICORT) 80-4.5 MCG/ACT inhaler Inhale 2 puffs into the lungs 2 (two) times daily. 10/02/17  Yes English, Colletta [place] D, PA  cetirizine (ZYRTEC) 10 MG tablet Take 1 tablet (10 mg total) by mouth daily. 10/02/17  Yes English, Colletta Maryland D, PA  clotrimazole-betamethasone (LOTRISONE) cream Apply 1 application topically 2  (two) times daily. 02/12/17  Yes Scot Jun, FNP  EPINEPHrine 0.3 mg/0.3 mL IJ SOAJ injection Inject 0.3 mLs (0.3 mg total) into the muscle once. Use if needed for allergic reaction emergency.  Ok to sub generic device 01/28/16  Yes Copland, Gay Filler, MD  hydrOXYzine (ATARAX/VISTARIL) 25 MG tablet Take 0.5-1 tablets (12.5-25 mg total) by mouth every 8 (eight) hours as needed for itching. 10/02/17  Yes Ivar Drape D, PA  Nystatin POWD Apply liberally under breast and panty line area prevent irritaiton 02/12/17  Yes Scot Jun, FNP  PROAIR HFA 108 [phone] Base) MCG/ACT inhaler INHALE 2 PUFFS INTO THE LUNGS EVERY 6 (SIX) HOURS AS NEEDED FOR WHEEZING. 01/08/16  Yes Wardell Honour, MD  traMADol (ULTRAM) 50 MG tablet TAKE 1 TABLET BY MOUTH AT BEDTIME AS NEEDED FOR PAIN 12/09/17  Yes [provider]  triamcinolone cream (KENALOG) 0.1 % APPLY TO AFFECTED AREA TWICE A DAY (NOT TO FACE, GENITALS, OR AXILLES) 07/03/16  Yes Jaynee Eagles, PA-C                               Allergies  Allergen Reactions  . Shellfish Allergy Anaphylaxis  . Peanut-Containing Drug Products     Also walnuts and almonds       Objective:   Physical Exam  Constitutional: She is oriented to person, place, and time. She appears well-developed and well-nourished. No distress.  HENT:  Head: Normocephalic.  Right Ear: Hearing, tympanic membrane, external ear and ear canal normal.  Left Ear: Hearing, tympanic membrane, external ear and ear canal normal.  Nose: Mucosal edema (mild) and rhinorrhea present. Right sinus exhibits maxillary sinus tenderness and frontal sinus tenderness. Left sinus exhibits maxillary sinus tenderness and frontal sinus tenderness.  Mouth/Throat:    Eyes: Conjunctivae and lids are normal.  Neck: No JVD present.  Cardiovascular: Normal rate, regular rhythm, normal heart sounds and intact distal pulses. Exam reveals no gallop and no friction rub.  No murmur heard. Pulmonary/Chest:  Effort normal and breath sounds normal. No respiratory distress. She has no wheezes. She has no rales. She exhibits no tenderness.  Musculoskeletal:       Thoracic back: Normal. She exhibits normal range of motion and no tenderness.       Lumbar back: Normal. She exhibits normal range of motion and no tenderness.  Lymphadenopathy:       Head (right side): No submental, no submandibular, no tonsillar, no preauricular, no posterior auricular and no occipital adenopathy present.       Head (left side): No submental, no submandibular, no tonsillar, no preauricular, no posterior auricular and no occipital adenopathy present.    She has no cervical adenopathy.  Neurological: She is alert and oriented to person, place, and time.  Psychiatric: She has a normal mood and affect. Her behavior is normal.   Blood pressure (!) 142/78, pulse 94, temperature 98.7 F (37.1 C), temperature source Oral, resp. rate 18, height 5' 6.5" (1.689 m), weight 218 lb 3.2 oz (99 kg), SpO2 100 %.     Assessment & Plan:  1. Acute sinusitis, recurrence not specified, unspecified location Start Augmentin, Astelin, Tessalon, and Mucinex. Instructed to stay well hydrated and well rested. Follow-up if symptoms worsen or persist.   - amoxicillin-clavulanate (AUGMENTIN) 875-125 MG tablet; Take 1 tablet by mouth 2 (two) times daily.  Dispense: 20 tablet; Refill: 0 - azelastine (ASTELIN) 0.1 % nasal spray; Place 2 sprays into both nostrils 2 (two) times daily. Use in each nostril as directed  Dispense: 30 mL; Refill: 0 - benzonatate (TESSALON) 100 MG capsule; Take 1-2 capsules (100-200 mg total) by mouth 3 (three) times daily as needed for cough.  Dispense: 40 capsule; Refill: 0 - Guaifenesin (MUCINEX MAXIMUM STRENGTH) 1200 MG TB12; Take 1 tablet (1,200 mg total) by mouth every 12 (twelve) hours as needed.  Dispense: 14 tablet; Refill: 1  2. Tonsillolith Left sided tonsillolith. Educated lesion will likely clear on its own. Monitor  area for changes.  3. Rash and nonspecific skin eruption Asymptomatic today. Recurrent rash under breasts. Clears with Lotrisone, Nystatin, and Triamcinolone cream. Refills sent.   Return if symptoms worsen or fail to improve.  Noemi Chapel, PA-S

## 2018-01-19 NOTE — Patient Instructions (Addendum)
Get plenty of rest and drink at least 64 ounces of water daily.    IF you received an x-ray today, you will receive an invoice from [facility]. Please contact [facility] at [phone] with questions or concerns regarding your invoice.   IF you received labwork today, you will receive an invoice from [facility]. Please contact [facility] at [phone] with questions or concerns regarding your invoice.   Our billing staff will not be able to assist you with questions regarding bills from these companies.  You will be contacted with the lab results as soon as they are available. The fastest way to get your results is to activate your My Chart account. Instructions are located on the last page of this paperwork. If you have not heard from us regarding the results in 2 weeks, please contact this office.      

## 2018-01-23 MED ORDER — CLOTRIMAZOLE-BETAMETHASONE 1-0.05 % EX CREA: 1.0000 "application " | TOPICAL_CREAM | Freq: Two times a day (BID) | CUTANEOUS | 1 refills | Status: DC

## 2018-01-23 MED ORDER — TRIAMCINOLONE ACETONIDE 0.1 % EX CREA: TOPICAL_CREAM | CUTANEOUS | 1 refills | Status: DC

## 2018-01-23 MED ORDER — NYSTATIN POWD: 99 refills | Status: DC

## 2018-01-25 DIAGNOSIS — G5602 Carpal tunnel syndrome, left upper limb: Secondary | ICD-10-CM | POA: Insufficient documentation

## 2018-03-11 ENCOUNTER — Other Ambulatory Visit: Payer: Self-pay | Admitting: Physician Assistant

## 2018-03-11 DIAGNOSIS — R21 Rash and other nonspecific skin eruption: Secondary | ICD-10-CM

## 2018-03-11 NOTE — Telephone Encounter (Deleted)
Last OV: 02/2017 PCP: Cleophus Molt Pharmacy: [id number] [address], [place] [phone] (Phone) [phone] (Fax)

## 2018-03-13 ENCOUNTER — Encounter (HOSPITAL_COMMUNITY): Payer: Self-pay | Admitting: Emergency Medicine

## 2018-03-13 ENCOUNTER — Emergency Department (HOSPITAL_COMMUNITY)
Admission: EM | Admit: 2018-03-13 | Discharge: 2018-03-13 | Disposition: A | Discharge status: Home / Self Care | Payer: 59 | Attending: Emergency Medicine | Admitting: Emergency Medicine

## 2018-03-13 DIAGNOSIS — Z91010 Allergy to peanuts: Secondary | ICD-10-CM | POA: Diagnosis not present

## 2018-03-13 DIAGNOSIS — I10 Essential (primary) hypertension: Secondary | ICD-10-CM | POA: Diagnosis not present

## 2018-03-13 DIAGNOSIS — Z79899 Other long term (current) drug therapy: Secondary | ICD-10-CM | POA: Diagnosis not present

## 2018-03-13 DIAGNOSIS — J45909 Unspecified asthma, uncomplicated: Secondary | ICD-10-CM | POA: Diagnosis not present

## 2018-03-13 DIAGNOSIS — L509 Urticaria, unspecified: Secondary | ICD-10-CM | POA: Diagnosis not present

## 2018-03-13 DIAGNOSIS — R21 Rash and other nonspecific skin eruption: Secondary | ICD-10-CM | POA: Diagnosis present

## 2018-03-13 DIAGNOSIS — Z7982 Long term (current) use of aspirin: Secondary | ICD-10-CM | POA: Diagnosis not present

## 2018-03-13 MED ORDER — IPRATROPIUM-ALBUTEROL 0.5-2.5 (3) MG/3ML IN SOLN
3.0000 mL | Freq: Once | RESPIRATORY_TRACT | Status: DC
  Filled 2018-03-13: qty 3

## 2018-03-13 MED ORDER — DIPHENHYDRAMINE HCL 50 MG/ML IJ SOLN
25.0000 mg | Freq: Once | INTRAMUSCULAR | Status: DC
  Filled 2018-03-13: qty 1

## 2018-03-13 MED ORDER — METHYLPREDNISOLONE SODIUM SUCC 125 MG IJ SOLR
125.0000 mg | Freq: Once | INTRAMUSCULAR | Status: AC
  Administered 2018-03-13: 125 mg via INTRAVENOUS
  Filled 2018-03-13: qty 2

## 2018-03-13 MED ORDER — PREDNISONE 20 MG PO TABS: ORAL_TABLET | ORAL | 0 refills | Status: DC

## 2018-03-13 MED ORDER — DIPHENHYDRAMINE HCL 50 MG/ML IJ SOLN
50.0000 mg | Freq: Once | INTRAMUSCULAR | Status: AC
  Administered 2018-03-13: 50 mg via INTRAVENOUS
  Filled 2018-03-13: qty 1

## 2018-03-13 MED ORDER — AMLODIPINE BESYLATE 5 MG PO TABS
5.0000 mg | ORAL_TABLET | Freq: Once | ORAL | Status: AC
  Administered 2018-03-13: 5 mg via ORAL
  Filled 2018-03-13: qty 1

## 2018-03-13 MED ORDER — FAMOTIDINE IN NACL 20-0.9 MG/50ML-% IV SOLN
20.0000 mg | Freq: Once | INTRAVENOUS | Status: AC
  Administered 2018-03-13: 20 mg via INTRAVENOUS
  Filled 2018-03-13: qty 50

## 2018-03-13 NOTE — ED Provider Notes (Signed)
[facility] EMERGENCY DEPARTMENT Provider Note   CSN: [id number] Arrival date & time: 03/13/18  1143     History   Chief Complaint Chief Complaint  Patient presents with  . Allergic Reaction    HPI Nicole Reyes is a 62 y.o. female.  HPI Planes of diffuse itching for 1 week.  She reports shortness of breath earlier today however she treated herself with her Symbicort inhaler and her Pro Air inhaler with relief of breathing.  She presently denies any shortness of breath.  She presents today as "my face broke out this morning" no other associated symptoms Past Medical History:  Diagnosis Date  . Allergy   . Anxiety   . Asthma   . Hypertension     Patient Active Problem List   Diagnosis Date Noted  . 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 Dry eye syndrome 11/20/2017  . Cataracts, bilateral 11/20/2017  . 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 Obesity 01/25/2015  . Pure hypercholesterolemia 02/10/2014  . Asthma 12/19/2012  . HTN (hypertension) 12/19/2012    Past Surgical History:  Procedure Laterality Date  . COLONOSCOPY  06/29/2011   Normal  . COLONOSCOPY&ENDOSCOPY  1980's   PT UNSURE PLACE&MD WHO DID EXAMS=NORMAL  . DILATION AND CURETTAGE OF UTERUS    . LAPAROSCOPY     REMOVED SCARE TISSUE FROM OVARIES     OB History   None      Home Medications    Prior to Admission medications   Medication Sig Start Date End Date Taking? Authorizing Provider  amLODipine (NORVASC) 10 MG tablet TAKE 1 TABLET BY MOUTH EVERY DAY 02/12/17   Scot Jun, FNP  amoxicillin-clavulanate (AUGMENTIN) 875-125 MG tablet Take 1 tablet by mouth 2 (two) times daily. 01/19/18   Harrison Mons, PA-C  aspirin 81 MG tablet Take 81 mg by mouth daily.      [provider]  azelastine (ASTELIN) 0.1 % nasal spray Place 2 sprays into both nostrils 2 (two) times daily. Use in each nostril as directed 01/19/18   Harrison Mons, PA-C    benzonatate (TESSALON) 100 MG capsule Take 1-2 capsules (100-200 mg total) by mouth 3 (three) times daily as needed for cough. 01/19/18   Harrison Mons, PA-C  budesonide-formoterol (SYMBICORT) 80-4.5 MCG/ACT inhaler Inhale 2 puffs into the lungs 2 (two) times daily. 10/02/17   Ivar Drape D, PA  cetirizine (ZYRTEC) 10 MG tablet Take 1 tablet (10 mg total) by mouth daily. 10/02/17   Ivar Drape D, PA  clotrimazole-betamethasone (LOTRISONE) cream Apply 1 application topically 2 (two) times daily. 01/23/18   Harrison Mons, PA-C  EPINEPHrine 0.3 mg/0.3 mL IJ SOAJ injection Inject 0.3 mLs (0.3 mg total) into the muscle once. Use if needed for allergic reaction emergency.  Ok to sub generic device 01/28/16   Copland, Gay Filler, MD  Guaifenesin (MUCINEX MAXIMUM STRENGTH) 1200 MG TB12 Take 1 tablet (1,200 mg total) by mouth every 12 (twelve) hours as needed. 01/19/18   Harrison Mons, PA-C  hydrOXYzine (ATARAX/VISTARIL) 25 MG tablet Take 0.5-1 tablets (12.5-25 mg total) by mouth every 8 (eight) hours as needed for itching. 10/02/17   Ivar Drape D, PA  Nystatin POWD Apply liberally under breast and panty line area prevent irritaiton 01/23/18   Jeffery, Chelle, PA-C  PROAIR HFA 108 (90 Base) MCG/ACT inhaler INHALE 2 PUFFS INTO THE LUNGS EVERY 6 (SIX) HOURS AS NEEDED FOR WHEEZING. 01/08/16   Wardell Honour, MD  traMADol (  ULTRAM) 50 MG tablet TAKE 1 TABLET BY MOUTH AT BEDTIME AS NEEDED FOR PAIN 12/09/17   [provider]  triamcinolone cream (KENALOG) 0.1 % APPLY TO AFFECTED AREA TWICE A DAY (NOT TO FACE, GENITALS, OR AXILLES) 03/11/18   Joretta Bachelor, PA    Family History Family History  Problem Relation Age of Onset  . Alcohol abuse Father   . Heart disease Father 83       AMI age 25  . Stroke Father 37       CVA  . Hypertension Mother   . Asthma Mother   . Hypertension Sister   . Hypertension Brother   . Hypertension Sister   . Hypertension Sister   .  Hypertension Sister   . Hypertension Brother   . Colon cancer Neg Hx     Social History Social History   Tobacco Use  . Smoking status: Never Smoker  . Smokeless tobacco: Never Used  Substance Use Topics  . Alcohol use: No    Alcohol/week: 0.0 oz  . Drug use: No     Allergies   Shellfish allergy and Peanut-containing drug products   Review of Systems Review of Systems  Constitutional: Negative.   HENT: Negative.   Respiratory: Positive for shortness of breath.   Cardiovascular: Negative.   Gastrointestinal: Negative.   Musculoskeletal: Negative.   Skin: Positive for rash.  Neurological: Negative.   Psychiatric/Behavioral: Negative.   All other systems reviewed and are negative.    Physical Exam Updated Vital Signs BP (!) 166/103 (BP Location: Right Arm)   Pulse 82   Temp 98.1 F (36.7 C) (Oral)   Resp 18   SpO2 100%   Physical Exam  Constitutional: She is oriented to person, place, and time. She appears well-developed and well-nourished. No distress.  HENT:  Head: Normocephalic and atraumatic.  No mucosal lesion  Eyes: Pupils are equal, round, and reactive to light. Conjunctivae are normal.  Neck: Neck supple. No tracheal deviation present. No thyromegaly present.  Cardiovascular: Normal rate, regular rhythm and normal heart sounds.  No murmur heard. Pulmonary/Chest: Effort normal and breath sounds normal.  Abdominal: Soft. Bowel sounds are normal. She exhibits no distension. There is no tenderness.  Musculoskeletal: Normal range of motion. She exhibits no edema or tenderness.  Neurological: She is alert and oriented to person, place, and time. Coordination normal.  Skin: Skin is warm and dry. Capillary refill takes less than 2 seconds. Rash noted.  Vague hive-like rash over face trunk and extremities  Psychiatric: She has a normal mood and affect.  Nursing note and vitals reviewed.    ED Treatments / Results  Labs (all labs ordered are listed, but  only abnormal results are displayed) Labs Reviewed - No data to display  EKG None  Radiology No results found.  Procedures Procedures (including critical care time)  Medications Ordered in ED Medications  diphenhydrAMINE (BENADRYL) injection 25 mg (has no administration in time range)  famotidine (PEPCID) IVPB 20 mg premix (has no administration in time range)  methylPREDNISolone sodium succinate (SOLU-MEDROL) 125 mg/2 mL injection 125 mg (has no administration in time range)  ipratropium-albuterol (DUONEB) 0.5-2.5 (3) MG/3ML nebulizer solution 3 mL (has no administration in time range)     Initial Impression / Assessment and Plan / ED Course  I have reviewed the triage vital signs and the nursing notes.  Pertinent labs & imaging results that were available during my care of the patient were reviewed by me and considered  in my medical decision making (see chart for details).     4:10 PM she feels much improved after treatment with intravenous Solu-Medrol.  IV Benadryl, Pepcid and Norvasc.  Rash much diminished.  She feels ready to go home.  Plan she can take Zyrtec as directed prescription prednisone.  Follow-up with her PCP if rash and itching does not continue to improve in 3 or 4 days.  She may need allergy testing.  Return precautions given for shortness of breath difficulty swallowing hoarse voice.  Final Clinical Impressions(s) / ED Diagnoses  Diagnosis urticaria Final diagnoses:  None    ED Discharge Orders    None       Orlie Dakin, MD 03/13/18 [phone]

## 2018-03-13 NOTE — Discharge Instructions (Addendum)
Take cetirizine (Zyrtec) as directed. Take the prednisone as prescribed starting tonight.  Return to the emergency department if you develop shortness of breath, difficulty swallowing or hoarse voice.  Otherwise follow-up with your primary care physician at [facility].  You may need to have allergy testing

## 2018-03-13 NOTE — ED Notes (Signed)
Pt report decrease in itching. States she feels much better.

## 2018-03-13 NOTE — ED Triage Notes (Addendum)
Pt to ED c/o allergic reaction x 2 days. States she has known peanut and shellfish allergies, but denies having contact with either and denies any new medications, detergents, or soaps. Pt has hives and itching to abdomen, chest, arms, and face, and new swelling to her face today. Pt reports she used her inhaler with some relief. Airway intact, talking in full sentences, denies dyspnea at this time. Reports a scratchy throat. Has tried proair inhaler, cortisone and benadryl.

## 2018-03-16 ENCOUNTER — Encounter: Payer: Self-pay | Admitting: Physician Assistant

## 2018-03-16 ENCOUNTER — Ambulatory Visit: Payer: Self-pay | Admitting: *Deleted

## 2018-03-16 ENCOUNTER — Other Ambulatory Visit: Payer: Self-pay

## 2018-03-16 ENCOUNTER — Ambulatory Visit: Payer: 59 | Admitting: Physician Assistant

## 2018-03-16 VITALS — BP 152/80 | HR 99 | Temp 98.2°F | Resp 18 | Ht 66.5 in | Wt 222.4 lb

## 2018-03-16 DIAGNOSIS — R21 Rash and other nonspecific skin eruption: Secondary | ICD-10-CM

## 2018-03-16 DIAGNOSIS — L509 Urticaria, unspecified: Secondary | ICD-10-CM | POA: Diagnosis not present

## 2018-03-16 MED ORDER — TRIAMCINOLONE ACETONIDE 0.05 % EX OINT: 1.0000 "application " | TOPICAL_OINTMENT | Freq: Two times a day (BID) | CUTANEOUS | 0 refills | Status: DC

## 2018-03-16 MED ORDER — RANITIDINE HCL 150 MG PO TABS: 150.0000 mg | ORAL_TABLET | Freq: Two times a day (BID) | ORAL | 0 refills | Status: DC

## 2018-03-16 MED ORDER — PREDNISONE 20 MG PO TABS: ORAL_TABLET | ORAL | 0 refills | Status: DC

## 2018-03-16 NOTE — Patient Instructions (Addendum)
Continue the zyrtec with these two meds. Please await contact for the asthma/allergy center. Follow up 5 days.   Make sure you are hydrating well with water, and avoid all sodium with your diet at this time.   Rash A rash is a change in the color of the skin. A rash can also change the way your skin feels. There are many different conditions and factors that can cause a rash. Follow these instructions at home: Pay attention to any changes in your symptoms. Follow these instructions to help with your condition: Medicine Take or apply over-the-counter and prescription medicines only as told by your doctor. These may include:  Corticosteroid cream.  Anti-itch lotions.  Oral antihistamines.  Skin Care  Put cool compresses on the affected areas.  Try taking a bath with: ? Epsom salts. Follow the instructions on the packaging. You can get these at your local pharmacy or grocery store. ? Baking soda. Pour a small amount into the bath as told by your doctor. ? Colloidal oatmeal. Follow the instructions on the packaging. You can get this at your local pharmacy or grocery store.  Try putting baking soda paste onto your skin. Stir water into baking soda until it gets like a paste.  Do not scratch or rub your skin.  Avoid covering the rash. Make sure the rash is exposed to air as much as possible. General instructions  Avoid hot showers or baths, which can make itching worse. A cold shower may help.  Avoid scented soaps, detergents, and perfumes. Use gentle soaps, detergents, perfumes, and other cosmetic products.  Avoid anything that causes your rash. Keep a journal to help track what causes your rash. Write down: ? What you eat. ? What cosmetic products you use. ? What you drink. ? What you wear. This includes jewelry.  Keep all follow-up visits as told by your doctor. This is important. Contact a doctor if:  You sweat at night.  You lose weight.  You pee (urinate) more than  normal.  You feel weak.  You throw up (vomit).  Your skin or the whites of your eyes look yellow (jaundice).  Your skin: ? Tingles. ? Is numb.  Your rash: ? Does not go away after a few days. ? Gets worse.  You are: ? More thirsty than normal. ? More tired than normal.  You have: ? New symptoms. ? Pain in your belly (abdomen). ? A fever. ? Watery poop (diarrhea). Get help right away if:  Your rash covers all or most of your body. The rash may or may not be painful.  You have blisters that: ? Are on top of the rash. ? Grow larger. ? Grow together. ? Are painful. ? Are inside your nose or mouth.  You have a rash that: ? Looks like purple pinprick-sized spots all over your body. ? Has a "bull's eye" or looks like a target. ? Is red and painful, causes your skin to peel, and is not from being in the sun too long. This information is not intended to replace advice given to you by your health care provider. Make sure you discuss any questions you have with your health care provider. Document Released: 05/18/2008 Document Revised: 05/07/2016 Document Reviewed: 04/17/2015 Elsevier Interactive Patient Education  2018 [employer].    IF you received an x-ray today, you will receive an invoice from [facility]. Please contact [facility] at [phone] with questions or concerns regarding your invoice.   IF you received  labwork today, you will receive an invoice from [facility]. Please contact [facility] at [phone] with questions or concerns regarding your invoice.   Our billing staff will not be able to assist you with questions regarding bills from these companies.  You will be contacted with the lab results as soon as they are available. The fastest way to get your results is to activate your My Chart account. Instructions are located on the last page of this paperwork. If you have not heard from [place] regarding the results in 2 weeks, please contact  this office.

## 2018-03-16 NOTE — Telephone Encounter (Signed)
Patient was Sunday for allergic reaction. Patient has been taking Prednisone and just finished today- patient reports her breathing improved- but her swelling and itching has not. Patient is at work and she is miserable. Appointment for today- patient instructed she can take benadryl for itching and if her symptoms get worse- she should not wait and go to ED.  Reason for Disposition . SEVERE swelling of the entire face  Answer Assessment - Initial Assessment Questions 1. ONSET: "When did the swelling start?" (e.g., minutes, hours, days)     Sunday- patient reports no better- she has had a reaction to something 2. LOCATION: "What part of the face is swollen?"     Nose, eyes puffy 3. SEVERITY: "How swollen is it?"     Patient can tell that she is swollen looking in the mirror 4. ITCHING: "Is there any itching?" If so, ask: "How much?"   (Scale 1-10; mild, moderate or severe)     Itching face , neck and under arm 5. PAIN: "Is the swelling painful to touch?" If so, ask: "How painful is it?"   (Scale 1-10; mild, moderate or severe)     String- burning pian 6. FEVER: "Do you have a fever?" If so, ask: "What is it, how was it measured, and when did it start?"      no 7. CAUSE: "What do you think is causing the face swelling?"     Allergic reaction 8. RECURRENT SYMPTOM: "Have you had face swelling before?" If so, ask: "When was the last time?" "What happened that time?"     2 weeks of reaction 9. OTHER SYMPTOMS: "Do you have any other symptoms?" (e.g., toothache, leg swelling)     Rash is face, eye lids, cheeks, eyes, stomach, neck 10. PREGNANCY: "Is there any chance you are pregnant?" "When was your last menstrual period?"       n/a  Protocols used: [facility]

## 2018-03-16 NOTE — Progress Notes (Signed)
PRIMARY CARE AT [place], [place] [id number] [phone]  Date:  03/16/2018   Name:  Nicole Reyes   DOB:  13-Mar-1956   MRN:  [id number]  PCP:  Joretta Bachelor, PA    History of Present Illness:  Nicole Reyes is a 62 y.o. female patient who presents to PCP with  Chief Complaint  Patient presents with  . Allergic Reaction    statred sunday and went to ED still having swelling, stinging in the face, broken out   Patient is here for 10 days of rash.  Patient reports that she has had itching and swelling of her face and chest, as well as rash.  She was seen 4 days ago for diagnosis of urticaria.  She was given a dose of Solu-Medrol,, Pepcid, Norvasc.  She had improvement of her symptoms and was given prednisone 40 mg daily.  She has noted that the rash is slightly raised surface or so.  Feels very tight and swollen.  She has had no fever.  She has never had no drainage the rash.  She cannot recall any new hygiene products.  She is not around animals.  She has not ingested anything abnormal to her normal diet.  She reports similar symptoms occurring a year ago.  This occurred when the seasons had changed and she was exposed to pollen and other seasonal allergens.  She does recall the facial swelling and tightness as well.  She denies shortness of breath or trouble breathing.    Patient Active Problem List   Diagnosis Date Noted  . 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 Dry eye syndrome 11/20/2017  . Cataracts, bilateral 11/20/2017  . 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 Obesity 01/25/2015  . Pure hypercholesterolemia 02/10/2014  . Asthma 12/19/2012  . HTN (hypertension) 12/19/2012    Past Medical History:  Diagnosis Date  . Allergy   . Anxiety   . Asthma   . Hypertension     Past Surgical History:  Procedure Laterality Date  . COLONOSCOPY  06/29/2011   Normal  . COLONOSCOPY&ENDOSCOPY  1980's   PT UNSURE PLACE&MD WHO DID  EXAMS=NORMAL  . DILATION AND CURETTAGE OF UTERUS    . LAPAROSCOPY     REMOVED SCARE TISSUE FROM OVARIES    Social History   Tobacco Use  . Smoking status: Never Smoker  . Smokeless tobacco: Never Used  Substance Use Topics  . Alcohol use: No    Alcohol/week: 0.0 oz  . Drug use: No    Family History  Problem Relation Age of Onset  . Alcohol abuse Father   . Heart disease Father 51       AMI age 44  . Stroke Father 89       CVA  . Hypertension Mother   . Asthma Mother   . Hypertension Sister   . Hypertension Brother   . Hypertension Sister   . Hypertension Sister   . Hypertension Sister   . Hypertension Brother   . Colon cancer Neg Hx     Allergies  Allergen Reactions  . Shellfish Allergy Anaphylaxis  . Peanut-Containing Drug Products     Also walnuts and almonds     Medication list has been reviewed and updated.  Current Outpatient Medications on File Prior to Visit  Medication Sig Dispense Refill  . amLODipine (NORVASC) 10 MG tablet TAKE 1 TABLET BY MOUTH EVERY DAY 90 tablet 3  . aspirin 81 MG  tablet Take 81 mg by mouth daily.      . budesonide-formoterol (SYMBICORT) 80-4.5 MCG/ACT inhaler Inhale 2 puffs into the lungs 2 (two) times daily. 10.2 Inhaler 11  . cetirizine (ZYRTEC) 10 MG tablet Take 1 tablet (10 mg total) by mouth daily. 30 tablet 11  . clotrimazole-betamethasone (LOTRISONE) cream Apply 1 application topically 2 (two) times daily. 90 g 1  . EPINEPHrine 0.3 mg/0.3 mL IJ SOAJ injection Inject 0.3 mLs (0.3 mg total) into the muscle once. Use if needed for allergic reaction emergency.  Ok to sub generic device 1 Device 1  . Guaifenesin (MUCINEX MAXIMUM STRENGTH) 1200 MG TB12 Take 1 tablet (1,200 mg total) by mouth every 12 (twelve) hours as needed. 14 tablet 1  . hydrOXYzine (ATARAX/VISTARIL) 25 MG tablet Take 0.5-1 tablets (12.5-25 mg total) by mouth every 8 (eight) hours as needed for itching. 30 tablet 5  . Nystatin POWD Apply liberally under breast  and panty line area prevent irritaiton 1 Bottle prn  . predniSONE (DELTASONE) 20 MG tablet 2 tabs po daily x 4 days 8 tablet 0  . PROAIR HFA 108 (90 Base) MCG/ACT inhaler INHALE 2 PUFFS INTO THE LUNGS EVERY 6 (SIX) HOURS AS NEEDED FOR WHEEZING. 25 Inhaler 2  . traMADol (ULTRAM) 50 MG tablet TAKE 1 TABLET BY MOUTH AT BEDTIME AS NEEDED FOR PAIN  1  . triamcinolone cream (KENALOG) 0.1 % APPLY TO AFFECTED AREA TWICE A DAY (NOT TO FACE, GENITALS, OR AXILLES) 45 g 1  . azelastine (ASTELIN) 0.1 % nasal spray Place 2 sprays into both nostrils 2 (two) times daily. Use in each nostril as directed (Patient not taking: Reported on 03/13/2018) 30 mL 0  . benzonatate (TESSALON) 100 MG capsule Take 1-2 capsules (100-200 mg total) by mouth 3 (three) times daily as needed for cough. (Patient not taking: Reported on 03/13/2018) 40 capsule 0   Current Facility-Administered Medications on File Prior to Visit  Medication Dose Route Frequency Provider Last Rate Last Dose  . cetirizine (ZYRTEC) tablet 5 mg  5 mg Oral Daily Copland, Jessica C, MD        ROS ROS otherwise unremarkable unless listed above.  Physical Examination: BP (!) 152/80   Pulse 99   Temp 98.2 F (36.8 C) (Oral)   Resp 18   Ht 5' 6.5" (1.689 m)   Wt 222 lb 6.4 oz (100.9 kg)   SpO2 98%   BMI 35.36 kg/m  Ideal Body Weight: Weight in (lb) to have BMI = 25: 156.9  Physical Exam  Constitutional: She is oriented to person, place, and time. She appears well-developed and well-nourished. No distress.  HENT:  Head: Normocephalic and atraumatic.  Right Ear: Tympanic membrane, external ear and ear canal normal.  Left Ear: Tympanic membrane, external ear and ear canal normal.  Nose: Rhinorrhea present. No mucosal edema. Right sinus exhibits no maxillary sinus tenderness and no frontal sinus tenderness. Left sinus exhibits no maxillary sinus tenderness and no frontal sinus tenderness.  Mouth/Throat: No uvula swelling. No oropharyngeal exudate,  posterior oropharyngeal edema or posterior oropharyngeal erythema.  Eyes: Pupils are equal, round, and reactive to light. Conjunctivae and EOM are normal.  Cardiovascular: Normal rate and regular rhythm. Exam reveals no gallop, no distant heart sounds and no friction rub.  No murmur heard. Pulmonary/Chest: Effort normal. No respiratory distress. She has no decreased breath sounds. She has no wheezes. She has no rhonchi.  Musculoskeletal:  Papular rash along the anterior chest.  There is no exudate  or drainage. Face with mild papular rash.  Face is edematous and skin is taute.  Lymphadenopathy:       Head (right side): No submandibular, no tonsillar, no preauricular and no posterior auricular adenopathy present.       Head (left side): No submandibular, no tonsillar, no preauricular and no posterior auricular adenopathy present.  Neurological: She is alert and oriented to person, place, and time.  Skin: She is not diaphoretic.  Psychiatric: She has a normal mood and affect. Her behavior is normal.     Assessment and Plan: Nicole Reyes is a 62 y.o. female who is here today for cc of  Chief Complaint  Patient presents with  . Allergic Reaction    statred sunday and went to ED still having swelling, stinging in the face, broken out   Advised to follow-up in 5 days.  Warned of alarming symptoms to warrant immediate return. Urticaria - Plan: ranitidine (ZANTAC) 150 MG tablet, predniSONE (DELTASONE) 20 MG tablet, TRIAMCINOLONE ACETONIDE, TOP, 0.05 % OINT  Papular rash - Plan: TRIAMCINOLONE ACETONIDE, TOP, 0.05 % OINT  Ivar Drape, PA-C [facility] 4/9/201910:13 AM

## 2018-03-22 ENCOUNTER — Encounter: Payer: Self-pay | Admitting: Physician Assistant

## 2018-03-23 ENCOUNTER — Other Ambulatory Visit: Payer: Self-pay

## 2018-03-23 ENCOUNTER — Encounter: Payer: Self-pay | Admitting: Physician Assistant

## 2018-03-23 ENCOUNTER — Ambulatory Visit: Payer: 59 | Admitting: Physician Assistant

## 2018-03-23 VITALS — BP 130/82 | HR 86 | Temp 98.4°F | Resp 16 | Ht 66.0 in | Wt 221.6 lb

## 2018-03-23 DIAGNOSIS — J302 Other seasonal allergic rhinitis: Secondary | ICD-10-CM | POA: Diagnosis not present

## 2018-03-23 DIAGNOSIS — Z889 Allergy status to unspecified drugs, medicaments and biological substances status: Secondary | ICD-10-CM | POA: Diagnosis not present

## 2018-03-23 MED ORDER — MONTELUKAST SODIUM 10 MG PO TABS: 10.0000 mg | ORAL_TABLET | Freq: Every day | ORAL | 11 refills | Status: DC

## 2018-03-23 NOTE — Progress Notes (Signed)
Nicole Reyes  MRN: [id number] DOB: [birth year]  PCP: Ivar Drape D, PA  Subjective:  Pt is a pleasant 62 year old female PMH HTN, HLD, obesity who presents to clinic for f/u allergic reaction.   She was here 4/3 s/p ED visit for allergic reaction on 3/31.  HPI from [place] 4/3: "She was given a dose of Solu-Medrol,, Pepcid, Norvasc.  She had improvement of her symptoms and was given prednisone 40 mg daily.  She has noted that the rash is slightly raised surface or so" she was asked to con't zantac.  She was referred to allergy/asthma center - has not received a call.   Today she feels 98% better. Rash is gone, itching mostly resolved. C/o patches on skin where she itched.   This is an every year thing - around March "when everything starts blooming" she breaks out.  She had a bad reaction a few years ago with facial swelling from either shrimp or peanuts. Was not referred to allergy at that time. She has never been to allergy.  She takes daily Cetirizine.   Review of Systems  Constitutional: Negative for diaphoresis and fatigue.  Respiratory: Negative for cough, chest tightness, shortness of breath, wheezing and stridor.   Gastrointestinal: Negative for abdominal pain, nausea and vomiting.  Skin: Positive for rash.    Patient Active Problem List   Diagnosis Date Noted  . 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 Dry eye syndrome 11/20/2017  . Cataracts, bilateral 11/20/2017  . 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 Obesity 01/25/2015  . Pure hypercholesterolemia 02/10/2014  . Asthma 12/19/2012  . HTN (hypertension) 12/19/2012    Current Outpatient Medications on File Prior to Visit  Medication Sig Dispense Refill  . amLODipine (NORVASC) 10 MG tablet TAKE 1 TABLET BY MOUTH EVERY DAY 90 tablet 3  . aspirin 81 MG tablet Take 81 mg by mouth daily.      Marland Kitchen azelastine (ASTELIN) 0.1 % nasal spray Place 2 sprays into both nostrils 2 (two) times daily.  Use in each nostril as directed 30 mL 0  . benzonatate (TESSALON) 100 MG capsule Take 1-2 capsules (100-200 mg total) by mouth 3 (three) times daily as needed for cough. 40 capsule 0  . budesonide-formoterol (SYMBICORT) 80-4.5 MCG/ACT inhaler Inhale 2 puffs into the lungs 2 (two) times daily. 10.2 Inhaler 11  . cetirizine (ZYRTEC) 10 MG tablet Take 1 tablet (10 mg total) by mouth daily. 30 tablet 11  . clotrimazole-betamethasone (LOTRISONE) cream Apply 1 application topically 2 (two) times daily. 90 g 1  . hydrOXYzine (ATARAX/VISTARIL) 25 MG tablet Take 0.5-1 tablets (12.5-25 mg total) by mouth every 8 (eight) hours as needed for itching. 30 tablet 5  . Nystatin POWD Apply liberally under breast and panty line area prevent irritaiton 1 Bottle prn  . PROAIR HFA 108 (90 Base) MCG/ACT inhaler INHALE 2 PUFFS INTO THE LUNGS EVERY 6 (SIX) HOURS AS NEEDED FOR WHEEZING. 25 Inhaler 2  . ranitidine (ZANTAC) 150 MG tablet Take 1 tablet (150 mg total) by mouth 2 (two) times daily. 60 tablet 0  . traMADol (ULTRAM) 50 MG tablet TAKE 1 TABLET BY MOUTH AT BEDTIME AS NEEDED FOR PAIN  1  . TRIAMCINOLONE ACETONIDE, TOP, 0.05 % OINT Apply 1 application topically 2 (two) times daily. Avoid eyelids 17 g 0  . triamcinolone cream (KENALOG) 0.1 % APPLY TO AFFECTED AREA TWICE A DAY (NOT TO FACE, GENITALS, OR AXILLES) 45 g 1  .  EPINEPHrine 0.3 mg/0.3 mL IJ SOAJ injection Inject 0.3 mLs (0.3 mg total) into the muscle once. Use if needed for allergic reaction emergency.  Ok to sub generic device (Patient not taking: Reported on 03/23/2018) 1 Device 1   Current Facility-Administered Medications on File Prior to Visit  Medication Dose Route Frequency Provider Last Rate Last Dose  . cetirizine (ZYRTEC) tablet 5 mg  5 mg Oral Daily Copland, Gay Filler, MD        Allergies  Allergen Reactions  . Shellfish Allergy Anaphylaxis  . Peanut-Containing Drug Products     Also walnuts and almonds      Objective:  BP 130/82   Pulse  86   Temp 98.4 F (36.9 C) (Oral)   Resp 16   Ht 5\' 6"  (1.676 m)   Wt 221 lb 9.6 oz (100.5 kg)   SpO2 98%   BMI 35.77 kg/m   Physical Exam  Constitutional: She is oriented to person, place, and time. No distress.  Cardiovascular: Normal rate, regular rhythm and normal heart sounds.  Pulmonary/Chest: Effort normal and breath sounds normal. She has no wheezes.  Neurological: She is alert and oriented to person, place, and time.  Skin: Skin is warm and dry.     Hyperpigmented patches with excoriations - all healing well.   Psychiatric: Judgment normal.  Vitals reviewed.   Assessment and Plan :  1. History of allergic reaction 2. Seasonal allergies - Ambulatory referral to Allergy - montelukast (SINGULAIR) 10 MG tablet; Take 1 tablet (10 mg total) by mouth at bedtime.  Dispense: 30 tablet; Refill: 11 - Pt presents for f/u allergic reaction. She is doing very well "I feel 98% better". She has not yet received phone call to sched appt with allergy - I cannot see referral. Referral made today. Plan to switch therapy to singulair. RTC if symptoms return. ED precautions discussed.   Mercer Pod, PA-C  Primary Care at [facility] 03/23/2018 12:15 PM

## 2018-03-23 NOTE — Patient Instructions (Addendum)
Stop cetirizine. Start Singulair (monteleukast).  You will receive a phone call within 2 weeks to schedule an appointment with allergy center. Please keep this appointment!  Stay well hydrated. Come back if your symptoms start to come back. Go to the ED if you are having trouble breathing or swelling of your throat.   Montelukast oral tablets What is this medicine? MONTELUKAST (mon te LOO kast) is used to prevent and treat the symptoms of asthma. It is also used to treat allergies. Do not use for an acute asthma attack. This medicine may be used for other purposes; ask your health care provider or pharmacist if you have questions. COMMON BRAND NAME(S): Singulair What should I tell my health care provider before I take this medicine? They need to know if you have any of these conditions: -liver disease -an unusual or allergic reaction to montelukast, other medicines, foods, dyes, or preservatives -pregnant or trying to get pregnant -breast-feeding How should I use this medicine? This medicine should be given by mouth. Follow the directions on the prescription label. Take this medicine at the same time every day. You may take this medicine with or without meals. Do not chew the tablets. Do not stop taking your medicine unless your doctor tells you to. Talk to your pediatrician regarding the use of this medicine in children. Special care may be needed. While this drug may be prescribed for children as young as 16 years of age for selected conditions, precautions do apply. Overdosage: If you think you have taken too much of this medicine contact a poison control center or emergency room at once. NOTE: This medicine is only for you. Do not share this medicine with others. What if I miss a dose? If you miss a dose, take it as soon as you can. If it is almost time for your next dose, take only that dose. Do not take double or extra doses. What may interact with this medicine? -anti-infectives like  rifampin and rifabutin -medicines for diabetes like rosiglitazone and repaglinide -medicines for seizures like phenytoin, phenobarbital, and carbamazepine -paclitaxel This list may not describe all possible interactions. Give your health care provider a list of all the medicines, herbs, non-prescription drugs, or dietary supplements you use. Also tell them if you smoke, drink alcohol, or use illegal drugs. Some items may interact with your medicine. What should I watch for while using this medicine? Visit your doctor or health care professional for regular checks on your progress. Tell your doctor or health care professional if your allergy or asthma symptoms do not improve. Take your medicine even when you do not have symptoms. Do not stop taking any of your medicine(s) unless your doctor tells you to. If you have asthma, talk to your doctor about what to do in an acute asthma attack. Always have your inhaled rescue medicine for asthma attacks with you. Patients and their families should watch for new or worsening thoughts of suicide or depression. Also watch for sudden changes in feelings such as feeling anxious, agitated, panicky, irritable, hostile, aggressive, impulsive, severely restless, overly excited and hyperactive, or not being able to sleep. Any worsening of mood or thoughts of suicide or dying should be reported to your health care professional right away. What side effects may I notice from receiving this medicine? Side effects that you should report to your doctor or health care professional as soon as possible: -allergic reactions like skin rash or hives, or swelling of the face, lips, or tongue -breathing  problems -confusion -dark urine -fever or infection -flu-like symptoms -hallucinations -painful lumps under the skin -pain, tingling, numbness in the hands or feet -sinus pain or swelling -suicidal thoughts or other mood changes -tremors -trouble sleeping -uncontrolled  muscle movements -unusual bleeding or bruising -yellowing of the eyes or skin Side effects that usually do not require medical attention (report to your doctor or health care professional if they continue or are bothersome): -cough -dizziness -drowsiness -headache -nightmares -stomach upset -stuffy nose This list may not describe all possible side effects. Call your doctor for medical advice about side effects. You may report side effects to FDA at 1-800-FDA-1088. Where should I keep my medicine? Keep out of the reach of children. Store at room temperature between 15 and 30 degrees C (59 and 86 degrees F). Protect from light and moisture. Keep this medicine in the original bottle. Throw away any unused medicine after the expiration date. NOTE: This sheet is a summary. It may not cover all possible information. If you have questions about this medicine, talk to your doctor, pharmacist, or health care provider.  2018 Elsevier/Gold Standard (2015-12-02 09:40:44)   Thank you for coming in today. I hope you feel we met your needs.  Feel free to call PCP if you have any questions or further requests.  Please consider signing up for MyChart if you do not already have it, as this is a great way to communicate with me.  Best,  Whitney McVey, PA-C   IF you received an x-ray today, you will receive an invoice from [facility]. Please contact [facility] at [phone] with questions or concerns regarding your invoice.   IF you received labwork today, you will receive an invoice from [facility]. Please contact [facility] at [phone] with questions or concerns regarding your invoice.   Our billing staff will not be able to assist you with questions regarding bills from these companies.  You will be contacted with the lab results as soon as they are available. The fastest way to get your results is to activate your My Chart account. Instructions are located on the last page of  this paperwork. If you have not heard from [place] regarding the results in 2 weeks, please contact this office.

## 2018-04-12 ENCOUNTER — Other Ambulatory Visit: Payer: Self-pay | Admitting: Physician Assistant

## 2018-04-12 DIAGNOSIS — L509 Urticaria, unspecified: Secondary | ICD-10-CM

## 2018-04-19 ENCOUNTER — Ambulatory Visit: Payer: 59 | Admitting: Allergy and Immunology

## 2018-04-20 ENCOUNTER — Other Ambulatory Visit: Payer: Self-pay | Admitting: Family Medicine

## 2018-04-20 DIAGNOSIS — I10 Essential (primary) hypertension: Secondary | ICD-10-CM

## 2018-04-29 ENCOUNTER — Other Ambulatory Visit: Payer: Self-pay | Admitting: Family Medicine

## 2018-04-29 ENCOUNTER — Ambulatory Visit: Payer: Self-pay

## 2018-04-29 DIAGNOSIS — M25531 Pain in right wrist: Secondary | ICD-10-CM

## 2018-05-09 ENCOUNTER — Encounter: Payer: Self-pay | Admitting: Family Medicine

## 2018-05-23 ENCOUNTER — Ambulatory Visit: Payer: 59 | Admitting: Allergy and Immunology

## 2018-05-23 ENCOUNTER — Encounter: Payer: Self-pay | Admitting: Allergy and Immunology

## 2018-05-23 VITALS — BP 130/78 | HR 82 | Temp 97.8°F | Resp 18 | Ht 65.0 in | Wt 213.8 lb

## 2018-05-23 DIAGNOSIS — J3089 Other allergic rhinitis: Secondary | ICD-10-CM | POA: Diagnosis not present

## 2018-05-23 DIAGNOSIS — L500 Allergic urticaria: Secondary | ICD-10-CM | POA: Diagnosis not present

## 2018-05-23 DIAGNOSIS — H1010 Acute atopic conjunctivitis, unspecified eye: Secondary | ICD-10-CM | POA: Insufficient documentation

## 2018-05-23 DIAGNOSIS — T7840XA Allergy, unspecified, initial encounter: Secondary | ICD-10-CM | POA: Insufficient documentation

## 2018-05-23 DIAGNOSIS — H1013 Acute atopic conjunctivitis, bilateral: Secondary | ICD-10-CM

## 2018-05-23 DIAGNOSIS — T7800XD Anaphylactic reaction due to unspecified food, subsequent encounter: Secondary | ICD-10-CM

## 2018-05-23 DIAGNOSIS — T7800XA Anaphylactic reaction due to unspecified food, initial encounter: Secondary | ICD-10-CM | POA: Insufficient documentation

## 2018-05-23 DIAGNOSIS — J4540 Moderate persistent asthma, uncomplicated: Secondary | ICD-10-CM

## 2018-05-23 MED ORDER — EPINEPHRINE 0.3 MG/0.3ML IJ SOAJ: 0.3000 mg | Freq: Once | INTRAMUSCULAR | 2 refills | Status: AC

## 2018-05-23 MED ORDER — LEVOCETIRIZINE DIHYDROCHLORIDE 5 MG PO TABS: 5.0000 mg | ORAL_TABLET | Freq: Every evening | ORAL | 5 refills | Status: DC

## 2018-05-23 MED ORDER — FLUTICASONE PROPIONATE 50 MCG/ACT NA SUSP: 1.0000 | Freq: Two times a day (BID) | NASAL | 5 refills | Status: DC | PRN

## 2018-05-23 MED ORDER — OLOPATADINE HCL 0.2 % OP SOLN: 1.0000 [drp] | Freq: Every day | OPHTHALMIC | 5 refills | Status: DC | PRN

## 2018-05-23 NOTE — Patient Instructions (Addendum)
Perennial and seasonal allergic rhinitis  Aeroallergen avoidance measures have been discussed and provided in written form.  A prescription has been provided for levocetirizine, 5 mg daily as needed.  A prescription has been provided for fluticasone nasal spray, one spray per nostril 1-2 times daily as needed. Proper nasal spray technique has been discussed and demonstrated.  Nasal saline spray (i.e., Simply Saline) or nasal saline lavage (i.e., NeilMed) is recommended as needed and prior to medicated nasal sprays.  If allergen avoidance measures and medications fail to adequately relieve symptoms, aeroallergen immunotherapy will be considered.  Allergic conjunctivitis  Treatment plan as outlined above for allergic rhinitis.  A prescription has been provided for Pataday, one drop per eye daily as needed.  I have also recommended eye lubricant drops (i.e., Natural Tears) as needed.  Food allergy The patient's history suggests food allergy and positive skin test results today confirm this diagnosis.  Meticulous avoidance of peanuts as discussed.  A prescription has been provided for epinephrine auto-injector 2 pack along with instructions for proper administration.  A food allergy action plan has been provided and discussed.  Medic Alert identification is recommended.  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 Skin tests to select food allergens were negative today. NSAIDs and emotional stress commonly exacerbate urticaria but are not the underlying etiology in this case. Physical urticarias are negative by history (i.e. pressure-induced, temperature, vibration, solar, etc.). History and lesions are not consistent with urticaria pigmentosa so I am not suspicious for mastocytosis. There are no concomitant symptoms concerning for anaphylaxis or constitutional symptoms worrisome for an underlying malignancy.    We will not order labs at this time, however, if lesions recur, persist, progress, or change in character in the absence of pollen exposure, we will assess potential etiologies with screening labs.  For symptom relief, patient is to take oral antihistamines as directed.  A prescription has been provided for levocetirizine 5 mg daily as needed.  For now, continue montelukast 10 mg daily at bedtime.  Should symptoms recur in the absence of pollen exposure, a journal is to be kept recording any foods eaten, beverages consumed, medications taken within a 6 hour period prior to the onset of symptoms, as well as record activities being performed, and environmental conditions. For any symptoms concerning for anaphylaxis, epinephrine is to be administered and 911 is to be called immediately.  Moderate persistent asthma Currently well controlled.  For now, continue Symbicort 80-4 0.5 g, 2 inhalations twice daily, montelukast 10 mg daily bedtime, and albuterol every 6 hours if needed.  To maximize pulmonary deposition, a spacer has been provided along with instructions for its proper administration with an HFA inhaler.  Subjective and objective measures of pulmonary function will be followed and the treatment plan will be adjusted accordingly.   Return in about 3 months (around 08/23/2018).  Control of House Dust Mite Allergen  House dust mites play a major role in allergic asthma and rhinitis.  They occur in environments with high humidity wherever human skin, the food for dust mites is found. High levels have been detected in dust obtained from mattresses, pillows, carpets, upholstered furniture, bed covers, clothes and soft toys.  The principal allergen of the house dust mite is found in its feces.  A gram of dust may contain 1,000 mites and 250,000 fecal particles.  Mite antigen is easily measured in the air during house cleaning activities.    1. Encase mattresses,  including the box spring,  and pillow, in an air tight cover.  Seal the zipper end of the encased mattresses with wide adhesive tape. 2. Wash the bedding in water of 130 degrees Farenheit weekly.  Avoid cotton comforters/quilts and flannel bedding: the most ideal bed covering is the dacron comforter. 3. Remove all upholstered furniture from the bedroom. 4. Remove carpets, carpet padding, rugs, and non-washable window drapes from the bedroom.  Wash drapes weekly or use plastic window coverings. 5. Remove all non-washable stuffed toys from the bedroom.  Wash stuffed toys weekly. 6. Have the room cleaned frequently with a vacuum cleaner and a damp dust-mop.  The patient should not be in a room which is being cleaned and should wait 1 hour after cleaning before going into the room. 7. Close and seal all heating outlets in the bedroom.  Otherwise, the room will become filled with dust-laden air.  An electric heater can be used to heat the room. 8. Reduce indoor humidity to less than 50%.  Do not use a humidifier.  Reducing Pollen Exposure  The American Academy of Allergy, Asthma and Immunology suggests the following steps to reduce your exposure to pollen during allergy seasons.    1. Do not hang sheets or clothing out to dry; pollen may collect on these items. 2. Do not mow lawns or spend time around freshly cut grass; mowing stirs up pollen. 3. Keep windows closed at night.  Keep car windows closed while driving. 4. Minimize morning activities outdoors, a time when pollen counts are usually at their highest. 5. Stay indoors as much as possible when pollen counts or humidity is high and on windy days when pollen tends to remain in the air longer. 6. Use air conditioning when possible.  Many air conditioners have filters that trap the pollen spores. 7. Use a HEPA room air filter to remove pollen form the indoor air you breathe.   Control of Cockroach Allergen  Cockroach allergen has been identified as an important cause of  acute attacks of asthma, especially in urban settings.  There are fifty-five species of cockroach that exist in the [place], however only three, the [place], [place] species produce allergen that can affect patients with Asthma.  Allergens can be obtained from fecal particles, egg casings and secretions from cockroaches.    1. Remove food sources. 2. Reduce access to water. 3. Seal access and entry points. 4. Spray runways with 0.5-1% Diazinon or Chlorpyrifos 5. Blow boric acid power under stoves and refrigerator. 6. Place bait stations (hydramethylnon) at feeding sites.

## 2018-05-23 NOTE — Assessment & Plan Note (Signed)
The patient's history suggests food allergy and positive skin test results today confirm this diagnosis.  Meticulous avoidance of peanuts as discussed.  A prescription has been provided for epinephrine auto-injector 2 pack along with instructions for proper administration.  A food allergy action plan has been provided and discussed.  Medic Alert identification is recommended.

## 2018-05-23 NOTE — Progress Notes (Signed)
New Patient Note  RE: Nicole Reyes MRN: [id number] DOB: 1956/11/01 Date of Office Visit: 05/23/2018  Referring provider: Dorise Hiss, PA-C Primary care provider: Joretta Bachelor, PA  Chief Complaint: Allergic Reaction; Urticaria; and Allergic Rhinitis    History of present illness: Nicole Reyes is a 62 y.o. female seen today in consultation requested by Dorise Hiss, PA-C  She reports that approximately 2 years ago she ate peanuts and developed facial pruritus.  The next day, she consumed more peanuts right before going to bed and, again, developed facial pruritus.  The next morning, she woke up with angioedema of the eyelids, lips, earlobes, and face.  In addition, she experienced pruritus "all over" and generalized urticaria.  She did not experience concomitant cardiopulmonary or GI symptoms.  She has avoided peanuts since that time. She reports that in April 2019, she once again experienced generalized urticaria as well as possible mild swelling of the eyelids.  She had not consumed peanuts, however believes that the symptoms may have been due to exposure to high pollen levels.  She experiences nasal congestion, rhinorrhea, sneezing, copious postnasal drainage, nasal pruritus, ocular pruritus, and occasional sinus pressure.  These symptoms occur year-round but are most frequent and severe with pollen exposure.  She attempts to control the symptoms with cetirizine and montelukast. Lynsay was diagnosed with asthma in the mid 90s.  She was started on Symbicort 80-4 0.5 g, 2 inhalations twice daily, approximately 4 or 5 years ago with significant benefit.  She currently takes Symbicort as well as montelukast 10 mg daily at bedtime.  While on this regimen, she has only required albuterol rescue on one occasion since the beginning of 2019.  Her typical asthma triggers include allergen exposure, upper respiratory tract infections, heat, and rapid weather  changes.  Assessment and plan: Perennial and seasonal allergic rhinitis  Aeroallergen avoidance measures have been discussed and provided in written form.  A prescription has been provided for levocetirizine, 5 mg daily as needed.  A prescription has been provided for fluticasone nasal spray, one spray per nostril 1-2 times daily as needed. Proper nasal spray technique has been discussed and demonstrated.  Nasal saline spray (i.e., Simply Saline) or nasal saline lavage (i.e., NeilMed) is recommended as needed and prior to medicated nasal sprays.  If allergen avoidance measures and medications fail to adequately relieve symptoms, aeroallergen immunotherapy will be considered.  Allergic conjunctivitis  Treatment plan as outlined above for allergic rhinitis.  A prescription has been provided for Pataday, one drop per eye daily as needed.  I have also recommended eye lubricant drops (i.e., Natural Tears) as needed.  Food allergy The patient's history suggests food allergy and positive skin test results today confirm this diagnosis.  Meticulous avoidance of peanuts as discussed.  A prescription has been provided for epinephrine auto-injector 2 pack along with instructions for proper administration.  A food allergy action plan has been provided and discussed.  Medic Alert identification is recommended.  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 Skin tests to select food allergens were negative today. NSAIDs and emotional stress commonly exacerbate urticaria but are not the underlying etiology in this case. Physical urticarias are negative by history (i.e. pressure-induced, temperature, vibration, solar, etc.). History and lesions are not consistent with urticaria pigmentosa so I am not suspicious for mastocytosis. There are no concomitant symptoms concerning for anaphylaxis or constitutional symptoms worrisome  for an underlying malignancy.  We will not order labs at this time, however, if lesions recur, persist, progress, or change in character in the absence of pollen exposure, we will assess potential etiologies with screening labs.  For symptom relief, patient is to take oral antihistamines as directed.  A prescription has been provided for levocetirizine 5 mg daily as needed.  For now, continue montelukast 10 mg daily at bedtime.  Should symptoms recur in the absence of pollen exposure, a journal is to be kept recording any foods eaten, beverages consumed, medications taken within a 6 hour period prior to the onset of symptoms, as well as record activities being performed, and environmental conditions. For any symptoms concerning for anaphylaxis, epinephrine is to be administered and 911 is to be called immediately.  Moderate persistent asthma Currently well controlled.  For now, continue Symbicort 80-4 0.5 g, 2 inhalations twice daily, montelukast 10 mg daily bedtime, and albuterol every 6 hours if needed.  To maximize pulmonary deposition, a spacer has been provided along with instructions for its proper administration with an HFA inhaler.  Subjective and objective measures of pulmonary function will be followed and the treatment plan will be adjusted accordingly.   Meds ordered this encounter  Medications  . levocetirizine (XYZAL) 5 MG tablet    Sig: Take 1 tablet (5 mg total) by mouth every evening.    Dispense:  30 tablet    Refill:  5  . fluticasone (FLONASE) 50 MCG/ACT nasal spray    Sig: Place 1 spray into both nostrils 2 (two) times daily as needed for allergies or rhinitis.    Dispense:  16 g    Refill:  5  . Olopatadine HCl (PATADAY) 0.2 % SOLN    Sig: Place 1 drop into both eyes daily as needed.    Dispense:  1 Bottle    Refill:  5  . EPINEPHrine (AUVI-Q) 0.3 mg/0.3 mL IJ SOAJ injection    Sig: Inject 0.3 mLs (0.3 mg total) into the muscle once for 1 dose.    Dispense:   2 Device    Refill:  2    [phone] (M)    Diagnostics: Spirometry: FVC was 2.08 L and FEV1 was 1.76 L (81% predicted) with significant (250 mL, 14%) postbronchodilator improvement.  This study was performed while the patient was asymptomatic.  Please see scanned spirometry results for details. Epicutaneous testing: Positive to weed pollen, tree pollen, and dust mite antigen. Intradermal testing: Positive to grass pollen, ragweed pollen, and cockroach antigen. Food allergen skin testing: Negative despite a positive histamine control.    Physical examination: Blood pressure 130/78, pulse 82, temperature 97.8 F (36.6 C), resp. rate 18, height 5\' 5"  (1.651 m), weight 213 lb 12.8 oz (97 kg), SpO2 92 %.  General: Alert, interactive, in no acute distress. HEENT: TMs pearly gray, turbinates edematous with clear discharge, post-pharynx moderately erythematous. Neck: Supple without lymphadenopathy. Lungs: Clear to auscultation without wheezing, rhonchi or rales. CV: Normal S1, S2 without murmurs. Abdomen: Nondistended, nontender. Skin: Warm and dry, without lesions or rashes. Extremities:  No clubbing, cyanosis or edema. Neuro:   Grossly intact.  Review of systems:  Review of systems negative except as noted in HPI / PMHx or noted below: Review of Systems  Constitutional: Negative.   HENT: Negative.   Eyes: Negative.   Respiratory: Negative.   Cardiovascular: Negative.   Gastrointestinal: Negative.   Genitourinary: Negative.   Musculoskeletal: Negative.   Skin: Negative.   Neurological: Negative.   Endo/Heme/Allergies: Negative.  Psychiatric/Behavioral: Negative.     Past medical history:  Past Medical History:  Diagnosis Date  . Allergy   . Anxiety   . Asthma   . Hypertension     Past surgical history:  Past Surgical History:  Procedure Laterality Date  . COLONOSCOPY  06/29/2011   Normal  . COLONOSCOPY&ENDOSCOPY  1980's   PT UNSURE PLACE&MD WHO DID EXAMS=NORMAL    . DILATION AND CURETTAGE OF UTERUS    . LAPAROSCOPY     REMOVED SCARE TISSUE FROM OVARIES    Family history: Family History  Problem Relation Age of Onset  . Alcohol abuse Father   . Heart disease Father 97       AMI age 45  . Stroke Father 49       CVA  . Hypertension Mother   . Asthma Mother   . Hypertension Sister   . Hypertension Brother   . Hypertension Sister   . Hypertension Sister   . Hypertension Sister   . Hypertension Brother   . Colon cancer Neg Hx     Social history: Social History   Socioeconomic History  . Marital status: Single    Spouse name: n/a  . Number of children: 0  . Years of education: Not on file  . Highest education level: Not on file  Occupational History  . Occupation: employed    Fish farm manager: [employer]  Social Needs  . Financial resource strain: Not on file  . Food insecurity:    Worry: Not on file    Inability: Not on file  . Transportation needs:    Medical: Not on file    Non-medical: Not on file  Tobacco Use  . Smoking status: Never Smoker  . Smokeless tobacco: Never Used  Substance and Sexual Activity  . Alcohol use: No    Alcohol/week: 0.0 oz  . Drug use: No  . Sexual activity: Yes    Birth control/protection: Post-menopausal  Lifestyle  . Physical activity:    Days per week: Not on file    Minutes per session: Not on file  . Stress: Not on file  Relationships  . Social connections:    Talks on phone: Not on file    Gets together: Not on file    Attends religious service: Not on file    Active member of club or organization: Not on file    Attends meetings of clubs or organizations: Not on file    Relationship status: Not on file  . Intimate partner violence:    Fear of current or ex partner: Not on file    Emotionally abused: Not on file    Physically abused: Not on file    Forced sexual activity: Not on file  Other Topics Concern  . 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      Tobacco: none      Alcohol: none      Exercise:  Four days per week.   Environmental History: The patient lives in apartment with carpeting throughout and central air/heat.  There is no known mold/water damage in the home.  She is a non-smoker.  There are no pets in the home.  Allergies as of 05/23/2018      Reactions   Shellfish Allergy Anaphylaxis   Peanut-containing  Drug Products    Also walnuts and almonds       Medication List        Accurate as of 05/23/18  6:35 PM. Always use your most recent med list.          amLODipine 10 MG tablet Commonly known as:  NORVASC TAKE 1 TABLET BY MOUTH EVERY DAY   aspirin 81 MG tablet Take 81 mg by mouth daily.   budesonide-formoterol 80-4.5 MCG/ACT inhaler Commonly known as:  SYMBICORT Inhale 2 puffs into the lungs 2 (two) times daily.   cetirizine 10 MG tablet Commonly known as:  ZYRTEC Take 1 tablet (10 mg total) by mouth daily.   clotrimazole-betamethasone cream Commonly known as:  LOTRISONE Apply 1 application topically 2 (two) times daily.   EPINEPHrine 0.3 mg/0.3 mL Soaj injection Commonly known as:  AUVI-Q Inject 0.3 mLs (0.3 mg total) into the muscle once for 1 dose.   fluticasone 50 MCG/ACT nasal spray Commonly known as:  FLONASE Place 1 spray into both nostrils 2 (two) times daily as needed for allergies or rhinitis.   hydrOXYzine 25 MG tablet Commonly known as:  ATARAX/VISTARIL Take 0.5-1 tablets (12.5-25 mg total) by mouth every 8 (eight) hours as needed for itching.   levocetirizine 5 MG tablet Commonly known as:  XYZAL Take 1 tablet (5 mg total) by mouth every evening.   montelukast 10 MG tablet Commonly known as:  SINGULAIR Take 1 tablet (10 mg total) by mouth at bedtime.   Nystatin Powd Apply liberally under breast and panty line area prevent irritaiton   Olopatadine HCl 0.2 % Soln Commonly known as:   PATADAY Place 1 drop into both eyes daily as needed.   PROAIR HFA 108 (90 Base) MCG/ACT inhaler Generic drug:  albuterol INHALE 2 PUFFS INTO THE LUNGS EVERY 6 (SIX) HOURS AS NEEDED FOR WHEEZING.   triamcinolone cream 0.1 % Commonly known as:  KENALOG APPLY TO AFFECTED AREA TWICE A DAY (NOT TO FACE, GENITALS, OR AXILLES)   vitamin B-12 500 MCG tablet Commonly known as:  CYANOCOBALAMIN Take 1,000 mcg by mouth daily.   Vitamin D3 2000 units Tabs Take 2,000 Units by mouth daily.       Known medication allergies: Allergies  Allergen Reactions  . Shellfish Allergy Anaphylaxis  . Peanut-Containing Drug Products     Also walnuts and almonds     I appreciate the opportunity to take part in Doneshia's care. Please do not hesitate to contact me with questions.  Sincerely,   R. Edgar Frisk, MD

## 2018-05-23 NOTE — Assessment & Plan Note (Signed)
The patient's history and skin test results suggest allergic urticaria secondary to pollen exposure. Skin tests to select food allergens were negative today. NSAIDs and emotional stress commonly exacerbate urticaria but are not the underlying etiology in this case. Physical urticarias are negative by history (i.e. pressure-induced, temperature, vibration, solar, etc.). History and lesions are not consistent with urticaria pigmentosa so I am not suspicious for mastocytosis. There are no concomitant symptoms concerning for anaphylaxis or constitutional symptoms worrisome for an underlying malignancy.   We will not order labs at this time, however, if lesions recur, persist, progress, or change in character in the absence of pollen exposure, we will assess potential etiologies with screening labs.  For symptom relief, patient is to take oral antihistamines as directed.  A prescription has been provided for levocetirizine 5 mg daily as needed.  For now, continue montelukast 10 mg daily at bedtime.  Should symptoms recur in the absence of pollen exposure, a journal is to be kept recording any foods eaten, beverages consumed, medications taken within a 6 hour period prior to the onset of symptoms, as well as record activities being performed, and environmental conditions. For any symptoms concerning for anaphylaxis, epinephrine is to be administered and 911 is to be called immediately.

## 2018-05-23 NOTE — Assessment & Plan Note (Signed)
   Aeroallergen avoidance measures have been discussed and provided in written form.  A prescription has been provided for levocetirizine, 5 mg daily as needed.  A prescription has been provided for fluticasone nasal spray, one spray per nostril 1-2 times daily as needed. Proper nasal spray technique has been discussed and demonstrated.  Nasal saline spray (i.e., Simply Saline) or nasal saline lavage (i.e., NeilMed) is recommended as needed and prior to medicated nasal sprays.  If allergen avoidance measures and medications fail to adequately relieve symptoms, aeroallergen immunotherapy will be considered. 

## 2018-05-23 NOTE — Assessment & Plan Note (Deleted)
The patient's history suggest allergic reactions, one of which may have been related to peanut consumption, however all food allergen skin tests are negative today despite a positive histamine control.  The hives may have represented allergic urticaria secondary to pollen exposure.  To be thorough, will investigate further with lab studies.  The following labs have been ordered: FCeRI antibody, TSH, anti-thyroglobulin antibody, thyroid peroxidase antibody, tryptase, CBC, CMP, ESR, ANA, and serum specific IgE against peanut, peanut components, tree nut panel, and alpha gal panel.   The patient will be called with further recommendations after lab results have returned.  Should symptoms recur, a journal is to be kept recording any foods eaten, beverages consumed, and medications taken within a 6 hour time period prior to the onset of symptoms, as well as record activities being performed, and environmental conditions. For any symptoms concerning for anaphylaxis, epinephrine is to be administered and 911 is to be called immediately.  A prescription has been provided for epinephrine 0.3 mg autoinjector (Auvi-Q) 2 pack along with instructions for its proper administration.

## 2018-05-23 NOTE — Assessment & Plan Note (Signed)
Currently well controlled.  For now, continue Symbicort 80-4 0.5 g, 2 inhalations twice daily, montelukast 10 mg daily bedtime, and albuterol every 6 hours if needed.  To maximize pulmonary deposition, a spacer has been provided along with instructions for its proper administration with an HFA inhaler.  Subjective and objective measures of pulmonary function will be followed and the treatment plan will be adjusted accordingly.

## 2018-05-23 NOTE — Assessment & Plan Note (Signed)
   Treatment plan as outlined above for allergic rhinitis.  A prescription has been provided for Pataday, one drop per eye daily as needed.  I have also recommended eye lubricant drops (i.e., Natural Tears) as needed. 

## 2018-05-31 ENCOUNTER — Encounter: Payer: Self-pay | Admitting: Physician Assistant

## 2018-05-31 ENCOUNTER — Ambulatory Visit: Payer: 59 | Admitting: Physician Assistant

## 2018-05-31 VITALS — BP 160/86 | HR 82 | Temp 98.2°F | Ht 65.0 in | Wt 218.2 lb

## 2018-05-31 DIAGNOSIS — I10 Essential (primary) hypertension: Secondary | ICD-10-CM | POA: Diagnosis not present

## 2018-05-31 DIAGNOSIS — J45909 Unspecified asthma, uncomplicated: Secondary | ICD-10-CM | POA: Diagnosis not present

## 2018-05-31 MED ORDER — AMLODIPINE BESYLATE 10 MG PO TABS: ORAL_TABLET | ORAL | 3 refills | Status: DC

## 2018-05-31 MED ORDER — ALBUTEROL SULFATE HFA 108 (90 BASE) MCG/ACT IN AERS: INHALATION_SPRAY | RESPIRATORY_TRACT | 2 refills | Status: DC

## 2018-05-31 NOTE — Progress Notes (Signed)
Nicole Reyes  MRN: [id number] DOB: 1956-07-30  PCP: Dorise Hiss, PA-C  Subjective:  Pt is a 62 year old female who presents to clinic for f/u HTN. 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   HTN - blood pressure today is 160/86. Norvasc 10mg . Last dose was yesterday. She does not take home blood pressure. She hasn't exercised in 3 years.  Diet: banana, packaged crackers, occasional energy drink, baked chicken, salad,  She is a never smoker.  Denies lightheadedness, dizziness, chronic headache, double vision, chest pain, shortness of breath, heart racing, palpitations, nausea, vomiting, abdominal pain, hematuria, lower leg swelling.   Pt  has a past medical history of Allergy, Anxiety, Asthma, and Hypertension.  Review of Systems  Constitutional: Negative for chills, diaphoresis, fatigue and fever.  Respiratory: Negative for cough, chest tightness, shortness of breath and wheezing.   Cardiovascular: Negative for chest pain and palpitations.  Gastrointestinal: Negative for abdominal pain, diarrhea, nausea and vomiting.  Neurological: Negative for weakness, light-headedness and headaches.    Patient Active Problem List   Diagnosis Date Noted  . Allergic reaction 05/23/2018  . Allergic urticaria 05/23/2018  . Perennial and seasonal allergic rhinitis 05/23/2018  . Allergic conjunctivitis 05/23/2018  . Food allergy 05/23/2018  . Carpal tunnel syndrome of left wrist 01/25/2018  . 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 Dry eye syndrome 11/20/2017  . Cataracts, bilateral 11/20/2017  . 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 Obesity 01/25/2015  . Pure hypercholesterolemia 02/10/2014  . Moderate persistent asthma 12/19/2012  . Hypertensive disorder 12/19/2012    Current Outpatient Medications on File Prior to Visit  Medication Sig Dispense Refill  . amLODipine (NORVASC) 10 MG tablet TAKE 1 TABLET BY MOUTH EVERY DAY 90 tablet 3  .  aspirin 81 MG tablet Take 81 mg by mouth daily.      . budesonide-formoterol (SYMBICORT) 80-4.5 MCG/ACT inhaler Inhale 2 puffs into the lungs 2 (two) times daily. 10.2 Inhaler 11  . cetirizine (ZYRTEC) 10 MG tablet Take 1 tablet (10 mg total) by mouth daily. 30 tablet 11  . Cholecalciferol (VITAMIN D3) 2000 units TABS Take 2,000 Units by mouth daily.    . clotrimazole-betamethasone (LOTRISONE) cream Apply 1 application topically 2 (two) times daily. 90 g 1  . fluticasone (FLONASE) 50 MCG/ACT nasal spray Place 1 spray into both nostrils 2 (two) times daily as needed for allergies or rhinitis. 16 g 5  . levocetirizine (XYZAL) 5 MG tablet Take 1 tablet (5 mg total) by mouth every evening. 30 tablet 5  . montelukast (SINGULAIR) 10 MG tablet Take 1 tablet (10 mg total) by mouth at bedtime. 30 tablet 11  . Nystatin POWD Apply liberally under breast and panty line area prevent irritaiton 1 Bottle prn  . Olopatadine HCl (PATADAY) 0.2 % SOLN Place 1 drop into both eyes daily as needed. 1 Bottle 5  . PROAIR HFA 108 (90 Base) MCG/ACT inhaler INHALE 2 PUFFS INTO THE LUNGS EVERY 6 (SIX) HOURS AS NEEDED FOR WHEEZING. 25 Inhaler 2  . triamcinolone cream (KENALOG) 0.1 % APPLY TO AFFECTED AREA TWICE A DAY (NOT TO FACE, GENITALS, OR AXILLES) 45 g 1  . vitamin B-12 (CYANOCOBALAMIN) 500 MCG tablet Take 1,000 mcg by mouth daily.     Current Facility-Administered Medications on File Prior to Visit  Medication Dose Route Frequency Provider Last Rate Last Dose  . cetirizine (ZYRTEC) tablet 5 mg  5 mg Oral Daily Copland, Gay Filler, MD  Allergies  Allergen Reactions  . Shellfish Allergy Anaphylaxis  . Peanut-Containing Drug Products     Also walnuts and almonds      Objective:  BP (!) 160/86 (BP Location: Left Arm, Patient Position: Sitting, Cuff Size: Large)   Pulse 82   Temp 98.2 F (36.8 C) (Oral)   Ht 5\' 5"  (1.651 m)   Wt 218 lb 3.2 oz (99 kg)   SpO2 97%   BMI 36.31 kg/m   Physical Exam    Constitutional: She is oriented to person, place, and time. No distress.  Cardiovascular: Normal rate, regular rhythm and normal heart sounds.  Neurological: She is alert and oriented to person, place, and time.  Skin: Skin is warm and dry.  Psychiatric: Judgment normal.  Vitals reviewed.   Assessment and Plan :  1. Essential hypertension -Patient presents for medication refill of Norvasc 10 mg.  Blood pressure is uncontrolled today 160/86.  She has not taken her medications today.  Discussed need for exercise.  Return to clinic in a few weeks for annual exam and blood pressure recheck.  Will consider adding another medication if uncontrolled - amLODipine (NORVASC) 10 MG tablet; TAKE 1 TABLET BY MOUTH EVERY DAY  Dispense: 90 tablet; Refill: 3  2. Uncomplicated asthma, unspecified asthma severity, unspecified whether persistent - albuterol (PROAIR HFA) 108 (90 Base) MCG/ACT inhaler; INHALE 2 PUFFS INTO THE LUNGS EVERY 6 (SIX) HOURS AS NEEDED FOR WHEEZING.  Dispense: 25 Inhaler; Refill: 2   Mercer Pod, PA-C  [facility] [facility] 05/31/2018 3:02 PM

## 2018-05-31 NOTE — Patient Instructions (Addendum)
Come back and see me in a few weeks for your annual exam.  See below for information about exercise.   How to Increase Your Level of Physical Activity Getting regular physical activity is important for your overall health and well-being. Most people do not get enough exercise. There are easy ways to increase your level of physical activity, even if you have not been very active in the past or you are just starting out. Why is physical activity important? Physical activity has many short-term and long-term health benefits. Regular exercise can:  Help you lose weight or maintain a healthy weight.  Strengthen your muscles and bones.  Boost your mood and improve self-esteem.  Reduce your risk of certain long-term (chronic) diseases, like heart disease, cancer, and diabetes.  Help you stay capable of walking and moving around (mobile) as you age.  Prevent accidents, such as falls, as you age.  Increase life expectancy.  What are the benefits of being physically active on a regular basis? In addition to improving your physical health, being physically active on most days of the week can help you in ways that you may not expect. Benefits of regular physical activity may include:  Feeling good about your body.  Being able to move around more easily and for longer periods of time without getting tired (increased stamina).  Finding new sources of fun and enjoyment.  Meeting new people who share a common interest.  Being able to fight off illness better (enhanced immunity).  Being able to sleep better.  What can happen if I am not physically active on a regular basis? Not getting enough physical activity can lead to an unhealthy lifestyle and future health problems. This can increase your chances of:  Becoming overweight or obese.  Becoming sick.  Developing chronic illnesses, like heart disease or diabetes.  Having mental health problems, like depression or anxiety.  Having  sleep problems.  Having trouble walking or getting yourself around (reduced mobility).  Injuring yourself in a fall as you get older.  What steps can I take to be more physically active?  Check with your health care provider about how to get started. Ask your health care provider what activities are safe for you.  Start out slowly. Walking or doing some simple chair exercises is a good place to start, especially if you have not been active before or for a long time.  Try to find activities that you enjoy. You are more likely to commit to an exercise routine if it does not feel like a chore.  If you have bone or joint problems, choose low-impact exercises, like walking or swimming.  Include physical activity in your everyday routine.  Invite friends or family members to exercise with you. This also will help you commit to your workout plan.  Set goals that you can work toward.  Aim for at least 150 minutes of moderate-intensity exercise each week. Examples of moderate-intensity exercise include walking or riding a bike. Where to find more information:  Centers for Disease Control and Prevention: [employer].is  President's [employer] on [employer], Sports & Nutrition www.http://villegas.org/  ChooseMyPlate: [employer].dk Contact a health care provider if:  You have headaches, muscle aches, or joint pain.  You feel dizzy or light-headed while exercising.  You faint.  You have chest pain while exercising. Summary  Exercise benefits your mind and body at any age, even if you are just starting out.  If you have a chronic illness or have not been active  for a while, check with your health care provider before increasing your physical activity.  Choose activities that are safe and enjoyable for you.Ask your health care provider what activities are safe for you.  Start slowly. Tell your health care provider if you have  problems as you start to increase your activity level. This information is not intended to replace advice given to you by your health care provider. Make sure you discuss any questions you have with your health care provider. Document Released: 11/19/2016 Document Revised: 11/19/2016 Document Reviewed: 11/19/2016 Elsevier Interactive Patient Education  2018 [employer].   IF you received an x-ray today, you will receive an invoice from [facility]. Please contact [facility] at [phone] with questions or concerns regarding your invoice.   IF you received labwork today, you will receive an invoice from [facility]. Please contact [facility] at [phone] with questions or concerns regarding your invoice.   Our billing staff will not be able to assist you with questions regarding bills from these companies.  You will be contacted with the lab results as soon as they are available. The fastest way to get your results is to activate your My Chart account. Instructions are located on the last page of this paperwork. If you have not heard from [place] regarding the results in 2 weeks, please contact this office.

## 2018-06-10 ENCOUNTER — Encounter: Payer: Self-pay | Admitting: Physician Assistant

## 2018-06-10 ENCOUNTER — Other Ambulatory Visit: Payer: Self-pay

## 2018-06-10 ENCOUNTER — Ambulatory Visit (INDEPENDENT_AMBULATORY_CARE_PROVIDER_SITE_OTHER): Payer: 59 | Admitting: Physician Assistant

## 2018-06-10 VITALS — BP 150/90 | HR 89 | Temp 97.9°F | Resp 16 | Ht 65.0 in | Wt 217.0 lb

## 2018-06-10 DIAGNOSIS — Z1329 Encounter for screening for other suspected endocrine disorder: Secondary | ICD-10-CM

## 2018-06-10 DIAGNOSIS — Z13220 Encounter for screening for lipoid disorders: Secondary | ICD-10-CM

## 2018-06-10 DIAGNOSIS — Z0000 Encounter for general adult medical examination without abnormal findings: Secondary | ICD-10-CM

## 2018-06-10 DIAGNOSIS — Z113 Encounter for screening for infections with a predominantly sexual mode of transmission: Secondary | ICD-10-CM

## 2018-06-10 DIAGNOSIS — Z130 Encounter for screening for diseases of the blood and blood-forming organs and certain disorders involving the immune mechanism: Secondary | ICD-10-CM | POA: Diagnosis not present

## 2018-06-10 DIAGNOSIS — Z13228 Encounter for screening for other metabolic disorders: Secondary | ICD-10-CM | POA: Diagnosis not present

## 2018-06-10 LAB — POCT URINALYSIS DIP (MANUAL ENTRY)
Bilirubin, UA: NEGATIVE
Blood, UA: NEGATIVE
Glucose, UA: NEGATIVE mg/dL
Ketones, POC UA: NEGATIVE mg/dL
Nitrite, UA: NEGATIVE
Protein Ur, POC: NEGATIVE mg/dL
Spec Grav, UA: 1.02 (ref 1.010–1.025)
Urobilinogen, UA: 0.2 E.U./dL
pH, UA: 7 (ref 5.0–8.0)

## 2018-06-10 NOTE — Patient Instructions (Addendum)
- Find out when your last PAP (cervical cancer screening) was. This should be done every 3 years.  - Make an appointment to have your eyes checked. Your vision today is 20/40.  - Get a routine teeth cleaning! Call your insurance to see who is covered.   - We recommend that you schedule a mammogram for breast cancer screening. Typically, you do not need a referral to do this. Please contact a local imaging center to schedule your mammogram.  [facility] ([place]) - [phone] or [phone]  [place] [phone] [place] - [phone]  Health Maintenance, Female Adopting a healthy lifestyle and getting preventive care can go a long way to promote health and wellness. Talk with your health care provider about what schedule of regular examinations is right for you. This is a good chance for you to check in with your provider about disease prevention and staying healthy. In between checkups, there are plenty of things you can do on your own. Experts have done a lot of research about which lifestyle changes and preventive measures are most likely to keep you healthy. Ask your health care provider for more information. Weight and diet Eat a healthy diet  Be sure to include plenty of vegetables, fruits, low-fat dairy products, and lean protein.  Do not eat a lot of foods high in solid fats, added sugars, or salt.  Get regular exercise. This is one of the most important things you can do for your health. ? Most adults should exercise for at least 150 minutes each week. The exercise should increase your heart rate and make you sweat (moderate-intensity exercise). ? Most adults should also do strengthening exercises at least twice a week. This is in addition to the moderate-intensity exercise.  Maintain a healthy weight  Body mass index (BMI) is a measurement that can be used to identify possible weight problems. It estimates body fat based on  height and weight. Your health care provider can help determine your BMI and help you achieve or maintain a healthy weight.  For females 3 years of age and older: ? A BMI below 18.5 is considered underweight. ? A BMI of 18.5 to 24.9 is normal. ? A BMI of 25 to 29.9 is considered overweight. ? A BMI of 30 and above is considered obese.  Watch levels of cholesterol and blood lipids  You should start having your blood tested for lipids and cholesterol at 62 years of age, then have this test every 5 years.  You may need to have your cholesterol levels checked more often if: ? Your lipid or cholesterol levels are high. ? You are older than 62 years of age. ? You are at high risk for heart disease.  Cancer screening Lung Cancer  Lung cancer screening is recommended for adults 26-75 years old who are at high risk for lung cancer because of a history of smoking.  A yearly low-dose CT scan of the lungs is recommended for people who: ? Currently smoke. ? Have quit within the past 15 years. ? Have at least a 30-pack-year history of smoking. A pack year is smoking an average of one pack of cigarettes a day for 1 year.  Yearly screening should continue until it has been 15 years since you quit.  Yearly screening should stop if you develop a health problem that would prevent you from having lung cancer treatment.  Breast Cancer  Practice breast self-awareness. This means understanding  how your breasts normally appear and feel.  It also means doing regular breast self-exams. Let your health care provider know about any changes, no matter how small.  If you are in your 20s or 30s, you should have a clinical breast exam (CBE) by a health care provider every 1-3 years as part of a regular health exam.  If you are 3 or older, have a CBE every year. Also consider having a breast X-ray (mammogram) every year.  If you have a family history of breast cancer, talk to your health care provider  about genetic screening.  If you are at high risk for breast cancer, talk to your health care provider about having an MRI and a mammogram every year.  Breast cancer gene (BRCA) assessment is recommended for women who have family members with BRCA-related cancers. BRCA-related cancers include: ? Breast. ? Ovarian. ? Tubal. ? Peritoneal cancers.  Results of the assessment will determine the need for genetic counseling and BRCA1 and BRCA2 testing.  Cervical Cancer Your health care provider may recommend that you be screened regularly for cancer of the pelvic organs (ovaries, uterus, and vagina). This screening involves a pelvic examination, including checking for microscopic changes to the surface of your cervix (Pap test). You may be encouraged to have this screening done every 3 years, beginning at age 18.  For women ages 12-65, health care providers may recommend pelvic exams and Pap testing every 3 years, or they may recommend the Pap and pelvic exam, combined with testing for human papilloma virus (HPV), every 5 years. Some types of HPV increase your risk of cervical cancer. Testing for HPV may also be done on women of any age with unclear Pap test results.  Other health care providers may not recommend any screening for nonpregnant women who are considered low risk for pelvic cancer and who do not have symptoms. Ask your health care provider if a screening pelvic exam is right for you.  If you have had past treatment for cervical cancer or a condition that could lead to cancer, you need Pap tests and screening for cancer for at least 20 years after your treatment. If Pap tests have been discontinued, your risk factors (such as having a new sexual partner) need to be reassessed to determine if screening should resume. Some women have medical problems that increase the chance of getting cervical cancer. In these cases, your health care provider may recommend more frequent screening and Pap  tests.  Colorectal Cancer  This type of cancer can be detected and often prevented.  Routine colorectal cancer screening usually begins at 62 years of age and continues through 62 years of age.  Your health care provider may recommend screening at an earlier age if you have risk factors for colon cancer.  Your health care provider may also recommend using home test kits to check for hidden blood in the stool.  A small camera at the end of a tube can be used to examine your colon directly (sigmoidoscopy or colonoscopy). This is done to check for the earliest forms of colorectal cancer.  Routine screening usually begins at age 26.  Direct examination of the colon should be repeated every 5-10 years through 62 years of age. However, you may need to be screened more often if early forms of precancerous polyps or small growths are found.  Skin Cancer  Check your skin from head to toe regularly.  Tell your health care provider about any new moles or  changes in moles, especially if there is a change in a mole's shape or color.  Also tell your health care provider if you have a mole that is larger than the size of a pencil eraser.  Always use sunscreen. Apply sunscreen liberally and repeatedly throughout the day.  Protect yourself by wearing long sleeves, pants, a wide-brimmed hat, and sunglasses whenever you are outside.  Heart disease, diabetes, and high blood pressure  High blood pressure causes heart disease and increases the risk of stroke. High blood pressure is more likely to develop in: ? People who have blood pressure in the high end of the normal range (130-139/85-89 mm Hg). ? People who are overweight or obese. ? People who are African American.  If you are 24-17 years of age, have your blood pressure checked every 3-5 years. If you are 103 years of age or older, have your blood pressure checked every year. You should have your blood pressure measured twice-once when you are at  a hospital or clinic, and once when you are not at a hospital or clinic. Record the average of the two measurements. To check your blood pressure when you are not at a hospital or clinic, you can use: ? An automated blood pressure machine at a pharmacy. ? A home blood pressure monitor.  If you are between 78 years and 73 years old, ask your health care provider if you should take aspirin to prevent strokes.  Have regular diabetes screenings. This involves taking a blood sample to check your fasting blood sugar level. ? If you are at a normal weight and have a low risk for diabetes, have this test once every three years after 62 years of age. ? If you are overweight and have a high risk for diabetes, consider being tested at a younger age or more often. Preventing infection Hepatitis B  If you have a higher risk for hepatitis B, you should be screened for this virus. You are considered at high risk for hepatitis B if: ? You were born in a country where hepatitis B is common. Ask your health care provider which countries are considered high risk. ? Your parents were born in a high-risk country, and you have not been immunized against hepatitis B (hepatitis B vaccine). ? You have HIV or AIDS. ? You use needles to inject street drugs. ? You live with someone who has hepatitis B. ? You have had sex with someone who has hepatitis B. ? You get hemodialysis treatment. ? You take certain medicines for conditions, including cancer, organ transplantation, and autoimmune conditions.  Hepatitis C  Blood testing is recommended for: ? Everyone born from [birth year] through 1965. ? Anyone with known risk factors for hepatitis C.  Sexually transmitted infections (STIs)  You should be screened for sexually transmitted infections (STIs) including gonorrhea and chlamydia if: ? You are sexually active and are younger than 62 years of age. ? You are older than 62 years of age and your health care provider tells  you that you are at risk for this type of infection. ? Your sexual activity has changed since you were last screened and you are at an increased risk for chlamydia or gonorrhea. Ask your health care provider if you are at risk.  If you do not have HIV, but are at risk, it may be recommended that you take a prescription medicine daily to prevent HIV infection. This is called pre-exposure prophylaxis (PrEP). You are considered at risk if: ?  You are sexually active and do not regularly use condoms or know the HIV status of your partner(s). ? You take drugs by injection. ? You are sexually active with a partner who has HIV.  Talk with your health care provider about whether you are at high risk of being infected with HIV. If you choose to begin PrEP, you should first be tested for HIV. You should then be tested every 3 months for as long as you are taking PrEP. Pregnancy  If you are premenopausal and you may become pregnant, ask your health care provider about preconception counseling.  If you may become pregnant, take 400 to 800 micrograms (mcg) of folic acid every day.  If you want to prevent pregnancy, talk to your health care provider about birth control (contraception). Osteoporosis and menopause  Osteoporosis is a disease in which the bones lose minerals and strength with aging. This can result in serious bone fractures. Your risk for osteoporosis can be identified using a bone density scan.  If you are 57 years of age or older, or if you are at risk for osteoporosis and fractures, ask your health care provider if you should be screened.  Ask your health care provider whether you should take a calcium or vitamin D supplement to lower your risk for osteoporosis.  Menopause may have certain physical symptoms and risks.  Hormone replacement therapy may reduce some of these symptoms and risks. Talk to your health care provider about whether hormone replacement therapy is right for  you. Follow these instructions at home:  Schedule regular health, dental, and eye exams.  Stay current with your immunizations.  Do not use any tobacco products including cigarettes, chewing tobacco, or electronic cigarettes.  If you are pregnant, do not drink alcohol.  If you are breastfeeding, limit how much and how often you drink alcohol.  Limit alcohol intake to no more than 1 drink per day for nonpregnant women. One drink equals 12 ounces of beer, 5 ounces of wine, or 1 ounces of hard liquor.  Do not use street drugs.  Do not share needles.  Ask your health care provider for help if you need support or information about quitting drugs.  Tell your health care provider if you often feel depressed.  Tell your health care provider if you have ever been abused or do not feel safe at home. This information is not intended to replace advice given to you by your health care provider. Make sure you discuss any questions you have with your health care provider. Document Released: 06/15/2011 Document Revised: 05/07/2016 Document Reviewed: 09/03/2015 Elsevier Interactive Patient Education  2018 [employer].  IF you received an x-ray today, you will receive an invoice from [facility]. Please contact [facility] at [phone] with questions or concerns regarding your invoice.   IF you received labwork today, you will receive an invoice from [place]. Please contact [facility] at [phone] with questions or concerns regarding your invoice.   Our billing staff will not be able to assist you with questions regarding bills from these companies.  You will be contacted with the lab results as soon as they are available. The fastest way to get your results is to activate your My Chart account. Instructions are located on the last page of this paperwork. If you have not heard from [place] regarding the results in 2 weeks, please contact this office.

## 2018-06-10 NOTE — Progress Notes (Signed)
Primary Care at [place], [place] [address] [phone]  Date:  06/10/2018   Name:  Nicole Reyes   DOB:  10/25/1956   MRN:  [id number]  PCP:  Dorise Hiss, PA-C    Chief Complaint: Annual Exam   History of Present Illness:  This is a 62 y.o. female who  has a past medical history of Allergy, Anxiety, Asthma, and Hypertension. who is presenting for CPE. She is an Administrator. "it's a nerve racking job".   HTN - today's blood pressure is 150/90. Norvasc 10 mg  Complaints: none LMP: Postmenopausal Contraception: n/a Last pap: unsure. She believes is this was 2 years ago at [facility].  Sexual history: not active.  Immunizations: utd Dentist: has not been in a few years. Eye: 20/40 b/l Fam hx: family history includes Alcohol abuse in her father; Asthma in her mother; Heart disease (age of onset: 61) in her father; Hyperlipidemia in her brother; Hypertension in her brother, brother, mother, sister, sister, sister, and sister; Stroke (age of onset: 28) in her father.  Tobacco/alcohol/substance use: Never Smoker, no drug use, does not drink  Mammogram: q yearly at [facility] Colonoscopy: Negative colonoscopy in 2012   Review of Systems:  Review of Systems  Constitutional: Negative for chills, diaphoresis, fatigue and fever.  HENT: Negative for congestion, postnasal drip, rhinorrhea, sinus pressure, sneezing and sore throat.   Respiratory: Negative for cough, chest tightness, shortness of breath and wheezing.   Cardiovascular: Negative for chest pain and palpitations.  Gastrointestinal: Negative for abdominal pain, diarrhea, nausea and vomiting.  Genitourinary: Negative for decreased urine volume, difficulty urinating, dysuria, enuresis, flank pain, frequency, hematuria and urgency.  Musculoskeletal: Negative for back pain.  Neurological: Negative for dizziness, weakness, light-headedness and headaches.    Patient  Active Problem List   Diagnosis Date Noted  . Allergic reaction 05/23/2018  . Allergic urticaria 05/23/2018  . Perennial and seasonal allergic rhinitis 05/23/2018  . Allergic conjunctivitis 05/23/2018  . Food allergy 05/23/2018  . Carpal tunnel syndrome of left wrist 01/25/2018  . 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 Dry eye syndrome 11/20/2017  . Cataracts, bilateral 11/20/2017  . 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 Obesity 01/25/2015  . Pure hypercholesterolemia 02/10/2014  . Moderate persistent asthma 12/19/2012  . Hypertensive disorder 12/19/2012    Prior to Admission medications   Medication Sig Start Date End Date Taking? Authorizing Provider  albuterol (PROAIR HFA) 108 (90 Base) MCG/ACT inhaler INHALE 2 PUFFS INTO THE LUNGS EVERY 6 (SIX) HOURS AS NEEDED FOR WHEEZING. 05/31/18  Yes Ivory Bail, Gelene Mink, PA-C  amLODipine (NORVASC) 10 MG tablet TAKE 1 TABLET BY MOUTH EVERY DAY 05/31/18  Yes Kaylee Trivett, Gelene Mink, PA-C  aspirin 81 MG tablet Take 81 mg by mouth daily.     Yes [provider]  budesonide-formoterol (SYMBICORT) 80-4.5 MCG/ACT inhaler Inhale 2 puffs into the lungs 2 (two) times daily. 10/02/17  Yes English, Colletta [place] D, PA  cetirizine (ZYRTEC) 10 MG tablet Take 1 tablet (10 mg total) by mouth daily. 10/02/17  Yes Ivar Drape D, PA  Cholecalciferol (VITAMIN D3) 2000 units TABS Take 2,000 Units by mouth daily.   Yes [provider]  clotrimazole-betamethasone (LOTRISONE) cream Apply 1 application topically 2 (two) times daily. 01/23/18  Yes Jeffery, Chelle, PA-C  fluticasone (FLONASE) 50 MCG/ACT nasal spray Place 1 spray into both nostrils 2 (two) times daily as needed for allergies or rhinitis. 05/23/18  Yes Bobbitt, Sedalia Muta, MD  levocetirizine (XYZAL) 5 MG tablet Take 1 tablet (5 mg total) by mouth every evening. 05/23/18  Yes Bobbitt, Sedalia Muta, MD  montelukast (SINGULAIR) 10 MG tablet Take 1 tablet (10 mg  total) by mouth at bedtime. 03/23/18  Yes Aydn Ferrara, Gelene Mink, PA-C  Nystatin POWD Apply liberally under breast and panty line area prevent irritaiton 01/23/18  Yes Jeffery, Chelle, PA-C  Olopatadine HCl (PATADAY) 0.2 % SOLN Place 1 drop into both eyes daily as needed. 05/23/18  Yes Bobbitt, Sedalia Muta, MD  triamcinolone cream (KENALOG) 0.1 % APPLY TO AFFECTED AREA TWICE A DAY (NOT TO FACE, GENITALS, OR AXILLES) 03/11/18  Yes English, Stephanie D, PA  vitamin B-12 (CYANOCOBALAMIN) 500 MCG tablet Take 1,000 mcg by mouth daily.   Yes [provider]    Allergies  Allergen Reactions  . Shellfish Allergy Anaphylaxis  . Peanut-Containing Drug Products     Also walnuts and almonds     Past Surgical History:  Procedure Laterality Date  . COLONOSCOPY  06/29/2011   Normal  . COLONOSCOPY&ENDOSCOPY  1980's   PT UNSURE PLACE&MD WHO DID EXAMS=NORMAL  . DILATION AND CURETTAGE OF UTERUS    . LAPAROSCOPY     REMOVED SCARE TISSUE FROM OVARIES    Social History   Tobacco Use  . Smoking status: Never Smoker  . Smokeless tobacco: Never Used  Substance Use Topics  . Alcohol use: No    Alcohol/week: 0.0 oz  . Drug use: No    Family History  Problem Relation Age of Onset  . Alcohol abuse Father   . Heart disease Father 50       AMI age 55  . Stroke Father 27       CVA  . Hypertension Mother   . Asthma Mother   . Hypertension Sister   . Hypertension Brother   . Hyperlipidemia Brother   . Hypertension Sister   . Hypertension Sister   . Hypertension Sister   . Hypertension Brother   . Colon cancer Neg Hx     Medication list has been reviewed and updated.  Physical Examination:  Physical Exam  Constitutional: She is oriented to person, place, and time. She appears well-developed and well-nourished. No distress.  HENT:  Head: Normocephalic and atraumatic.  Mouth/Throat: Oropharynx is clear and moist.  Eyes: Pupils are equal, round, and reactive to light. Conjunctivae  and EOM are normal.  Neck: Normal range of motion. Neck supple. No thyromegaly present.  Cardiovascular: Normal rate, regular rhythm and normal heart sounds.  No murmur heard. Pulmonary/Chest: Effort normal and breath sounds normal. She has no wheezes.  Abdominal: Soft. She exhibits no mass. There is no tenderness.  Musculoskeletal: Normal range of motion.  Neurological: She is alert and oriented to person, place, and time. She has normal reflexes.  Skin: Skin is warm and dry.  Psychiatric: She has a normal mood and affect. Her behavior is normal. Judgment and thought content normal.  Vitals reviewed.   BP (!) 150/90 (BP Location: Left Arm, Patient Position: Sitting, Cuff Size: Normal)   Pulse 89   Temp 97.9 F (36.6 C) (Oral)   Resp 16   Ht '5\' 5"'  (1.651 m)   Wt 217 lb (98.4 kg)   SpO2 100%   BMI 36.11 kg/m   Assessment and Plan: 1. Annual physical exam -Patient presents for annual exam.  She is feeling well today.  Up-to-date on her colonoscopy.  She is unsure about her Pap  and plans to check with Dillard Essex will let us know if she needs a Pap.  Routine labs are pending.  Will contact with results.  Anticipatory guidance provided  2. Screening for endocrine, metabolic and immunity disorder - POCT urinalysis dipstick - TSH - CMP14+EGFR - CBC with Differential/Platelet - VITAMIN D 25 Hydroxy (Vit-D Deficiency, Fractures)  3. Screening, lipid - Lipid panel  4. Screen for STD (sexually transmitted disease) - Chlamydia/Gonococcus/Trichomonas, NAA   Mercer Pod, PA-C  [facility] at [place] 06/10/2018 8:39 AM

## 2018-06-11 LAB — CBC WITH DIFFERENTIAL/PLATELET
Basophils Absolute: 0 10*3/uL (ref 0.0–0.2)
Basos: 1 %
EOS (ABSOLUTE): 0.2 10*3/uL (ref 0.0–0.4)
Eos: 4 %
Hematocrit: 38.7 % (ref 34.0–46.6)
Hemoglobin: 13.7 g/dL (ref 11.1–15.9)
Immature Grans (Abs): 0 10*3/uL (ref 0.0–0.1)
Immature Granulocytes: 0 %
Lymphocytes Absolute: 2.2 10*3/uL (ref 0.7–3.1)
Lymphs: 48 %
MCH: 26.9 pg (ref 26.6–33.0)
MCHC: 35.4 g/dL (ref 31.5–35.7)
MCV: 76 fL — ABNORMAL LOW (ref 79–97)
Monocytes Absolute: 0.3 10*3/uL (ref 0.1–0.9)
Monocytes: 5 %
Neutrophils Absolute: 2 10*3/uL (ref 1.4–7.0)
Neutrophils: 42 %
Platelets: 331 10*3/uL (ref 150–450)
RBC: 5.09 x10E6/uL (ref 3.77–5.28)
RDW: 13.8 % (ref 12.3–15.4)
WBC: 4.7 10*3/uL (ref 3.4–10.8)

## 2018-06-11 LAB — CMP14+EGFR
ALT: 32 IU/L (ref 0–32)
AST: 35 IU/L (ref 0–40)
Albumin/Globulin Ratio: 1.5 (ref 1.2–2.2)
Albumin: 4.5 g/dL (ref 3.6–4.8)
Alkaline Phosphatase: 161 IU/L — ABNORMAL HIGH (ref 39–117)
BUN/Creatinine Ratio: 15 (ref 12–28)
BUN: 12 mg/dL (ref 8–27)
Bilirubin Total: 0.5 mg/dL (ref 0.0–1.2)
CO2: 22 mmol/L (ref 20–29)
Calcium: 9.3 mg/dL (ref 8.7–10.3)
Chloride: 103 mmol/L (ref 96–106)
Creatinine, Ser: 0.79 mg/dL (ref 0.57–1.00)
GFR calc Af Amer: 93 mL/min/{1.73_m2} (ref >59)
GFR calc non Af Amer: 81 mL/min/{1.73_m2} (ref >59)
Globulin, Total: 3.1 g/dL (ref 1.5–4.5)
Glucose: 97 mg/dL (ref 65–99)
Potassium: 4.1 mmol/L (ref 3.5–5.2)
Sodium: 140 mmol/L (ref 134–144)
Total Protein: 7.6 g/dL (ref 6.0–8.5)

## 2018-06-11 LAB — LIPID PANEL
Chol/HDL Ratio: 3.8 ratio (ref 0.0–4.4)
Cholesterol, Total: 221 mg/dL — ABNORMAL HIGH (ref 100–199)
HDL: 58 mg/dL (ref >39)
LDL Calculated: 148 mg/dL — ABNORMAL HIGH (ref 0–99)
Triglycerides: 77 mg/dL (ref 0–149)
VLDL Cholesterol Cal: 15 mg/dL (ref 5–40)

## 2018-06-11 LAB — VITAMIN D 25 HYDROXY (VIT D DEFICIENCY, FRACTURES): Vit D, 25-Hydroxy: 35.5 ng/mL (ref 30.0–100.0)

## 2018-06-11 LAB — CHLAMYDIA/GONOCOCCUS/TRICHOMONAS, NAA
Chlamydia by NAA: NEGATIVE
Gonococcus by NAA: NEGATIVE
Trich vag by NAA: NEGATIVE

## 2018-06-11 LAB — TSH: TSH: 2.36 u[IU]/mL (ref 0.450–4.500)

## 2018-07-23 ENCOUNTER — Telehealth: Payer: Self-pay | Admitting: Physician Assistant

## 2018-07-23 NOTE — Telephone Encounter (Signed)
Pt dropped by [facility] form to be completed and faxed back to them.  I have left this in your box at the nurse station.,

## 2018-07-28 NOTE — Telephone Encounter (Signed)
Advise please.

## 2018-07-29 ENCOUNTER — Telehealth: Payer: Self-pay | Admitting: Physician Assistant

## 2018-07-29 ENCOUNTER — Encounter: Payer: Self-pay | Admitting: Physician Assistant

## 2018-07-29 NOTE — Progress Notes (Signed)
Result letter sent in mail.

## 2018-07-29 NOTE — Telephone Encounter (Signed)
Copied from [place] [id number]. Topic: Quick Communication - See Telephone Encounter >> Jul 29, 2018  4:04 PM Rutherford Nail, NT wrote: CRM for notification. See Telephone encounter for: 07/29/18. Patient calling and is check the status of the paper for her knee surgery that she dropped off on Saturday. States this was to be faxed to her orthopedist. Would like an update. Please advise. CB#: [phone]

## 2018-08-02 NOTE — Telephone Encounter (Signed)
Form placed in "to be faxed" box

## 2018-08-11 ENCOUNTER — Ambulatory Visit: Payer: Self-pay | Admitting: Orthopedic Surgery

## 2018-08-11 NOTE — Progress Notes (Signed)
Need orders in epic for 9-18 surgery pre op is 9-12.

## 2018-08-22 ENCOUNTER — Ambulatory Visit: Payer: Self-pay | Admitting: Orthopedic Surgery

## 2018-08-22 NOTE — Patient Instructions (Signed)
Latanga Nedrow  08/22/2018   Your procedure is scheduled on: Wednesday 08/31/2018  Report to [facility] Main  Entrance              Report to admitting at   [place] AM    Call this number if you have problems the morning of surgery [phone]    Remember: Do not eat food or drink liquids :After Midnight.     Take these medicines the morning of surgery with A SIP OF WATER: Amlodipine (Norvasc), use eye drops if needed, use Albuterol inhaler if needed and Spiriva inhaler and bring the inhalers with you to the hospital                                You may not have any metal on your body including hair pins and              piercings  Do not wear jewelry, make-up, lotions, powders or perfumes, deodorant             Do not wear nail polish.  Do not shave  48 hours prior to surgery.             Do not bring valuables to the hospital. [place].  Contacts, dentures or bridgework may not be worn into surgery.  Leave suitcase in the car. After surgery it may be brought to your room.                  Please read over the following fact sheets you were given: _____________________________________________________________________             [facility], [place] - Preparing for Surgery Before surgery, you can play an important role.  Because skin is not sterile, your skin needs to be as free of germs as possible.  You can reduce the number of germs on your skin by washing with CHG (chlorahexidine gluconate) soap before surgery.  CHG is an antiseptic cleaner which kills germs and bonds with the skin to continue killing germs even after washing. Please DO NOT use if you have an allergy to CHG or antibacterial soaps.  If your skin becomes reddened/irritated stop using the CHG and inform your nurse when you arrive at Short Stay. Do not shave (including legs and underarms) for at least 48 hours prior to the first CHG shower.  You  may shave your face/neck. Please follow these instructions carefully:  1.  Shower with CHG Soap the night before surgery and the  morning of Surgery.  2.  If you choose to wash your hair, wash your hair first as usual with your  normal  shampoo.  3.  After you shampoo, rinse your hair and body thoroughly to remove the  shampoo.                           4.  Use CHG as you would any other liquid soap.  You can apply chg directly  to the skin and wash                       Gently with a scrungie or clean washcloth.  5.  Apply the CHG Soap to your body ONLY FROM THE NECK DOWN.   Do not use on face/ open                           Wound or open sores. Avoid contact with eyes, ears mouth and genitals (private parts).                       Wash face,  Genitals (private parts) with your normal soap.             6.  Wash thoroughly, paying special attention to the area where your surgery  will be performed.  7.  Thoroughly rinse your body with warm water from the neck down.  8.  DO NOT shower/wash with your normal soap after using and rinsing off  the CHG Soap.                9.  Pat yourself dry with a clean towel.            10.  Wear clean pajamas.            11.  Place clean sheets on your bed the night of your first shower and do not  sleep with pets. Day of Surgery : Do not apply any lotions/deodorants the morning of surgery.  Please wear clean clothes to the hospital/surgery center.  FAILURE TO FOLLOW THESE INSTRUCTIONS MAY RESULT IN THE CANCELLATION OF YOUR SURGERY PATIENT SIGNATURE_________________________________  NURSE SIGNATURE__________________________________  ________________________________________________________________________   Adam Phenix  An incentive spirometer is a tool that can help keep your lungs clear and active. This tool measures how well you are filling your lungs with each breath. Taking long deep breaths may help reverse or decrease the chance of  developing breathing (pulmonary) problems (especially infection) following:  A long period of time when you are unable to move or be active. BEFORE THE PROCEDURE   If the spirometer includes an indicator to show your best effort, your nurse or respiratory therapist will set it to a desired goal.  If possible, sit up straight or lean slightly forward. Try not to slouch.  Hold the incentive spirometer in an upright position. INSTRUCTIONS FOR USE  1. Sit on the edge of your bed if possible, or sit up as far as you can in bed or on a chair. 2. Hold the incentive spirometer in an upright position. 3. Breathe out normally. 4. Place the mouthpiece in your mouth and seal your lips tightly around it. 5. Breathe in slowly and as deeply as possible, raising the piston or the ball toward the top of the column. 6. Hold your breath for 3-5 seconds or for as long as possible. Allow the piston or ball to fall to the bottom of the column. 7. Remove the mouthpiece from your mouth and breathe out normally. 8. Rest for a few seconds and repeat Steps 1 through 7 at least 10 times every 1-2 hours when you are awake. Take your time and take a few normal breaths between deep breaths. 9. The spirometer may include an indicator to show your best effort. Use the indicator as a goal to work toward during each repetition. 10. After each set of 10 deep breaths, practice coughing to be sure your lungs are clear. If you have an incision (the cut made at the time of surgery), support your incision when coughing by placing a pillow or  rolled up towels firmly against it. Once you are able to get out of bed, walk around indoors and cough well. You may stop using the incentive spirometer when instructed by your caregiver.  RISKS AND COMPLICATIONS  Take your time so you do not get dizzy or light-headed.  If you are in pain, you may need to take or ask for pain medication before doing incentive spirometry. It is harder to take a  deep breath if you are having pain. AFTER USE  Rest and breathe slowly and easily.  It can be helpful to keep track of a log of your progress. Your caregiver can provide you with a simple table to help with this. If you are using the spirometer at home, follow these instructions: [place] IF:   You are having difficultly using the spirometer.  You have trouble using the spirometer as often as instructed.  Your pain medication is not giving enough relief while using the spirometer.  You develop fever of 100.5 F (38.1 C) or higher. SEEK IMMEDIATE MEDICAL CARE IF:   You cough up bloody sputum that had not been present before.  You develop fever of 102 F (38.9 C) or greater.  You develop worsening pain at or near the incision site. MAKE SURE YOU:   Understand these instructions.  Will watch your condition.  Will get help right away if you are not doing well or get worse. Document Released: 04/12/2007 Document Revised: 02/22/2012 Document Reviewed: 06/13/2007 [facility] Patient Information 2014 [place], [place].   ________________________________________________________________________

## 2018-08-22 NOTE — H&P (Addendum)
Nicole Reyes is an 62 y.o. female.   Chief Complaint: right knee pain HPI: The patient is here for her H & P.  She is scheduled for a RIGHT total knee arthroplasty on 08/31/18 at [facility] by Dr. Tonita Reyes.  She reports chronic progressively worsening pain in both knees, but the right is more significant than the left.  Symptoms are refractory to bracing, quad strengthening, medications, injection therapy including cortisone and Visco supplementation, activity modifications and relative rest.  Pain is interfering with quality-of-life and activities of daily living at this point and she desires to proceed with surgery.  Dr. Tonita Reyes and the patient mutually agreed to proceed with a RIGHT total knee replacement. Risks and benefits of the procedure were discussed including stiffness, suboptimal range of motion, persistent pain, infection requiring removal of prosthesis and reinsertion, need for prophylactic antibiotics in the future, for example, dental procedures, possible need for manipulation, revision in the future and also anesthetic complications including DVT, PE, etc. We discussed the perioperative course, time in the hospital, postoperative recovery and the need for elevation to control swelling. We also discussed the predicted range of motion and the probability that squatting and kneeling would be unobtainable in the future. In addition, postoperative anticoagulation was discussed. We have obtained preoperative medical clearance as necessary. Provided illustrated handout and discussed it in detail. They will enroll in the total joint replacement educational forum at the hospital.   Her preoperative appointment at [facility] is on September 12.  She takes aspirin 325 mg daily with no GI upset.  She has no history personally or family history of DVT.  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  Dr. Tonita Reyes had discussed  possible cortisone injection postoperatively in her left knee if needed, she reports the left knee is doing fairly well at this point, some pain but it is tolerable.  Past Medical History:  Diagnosis Date  . Allergy   . Anxiety   . Asthma   . Hypertension     Past Surgical History:  Procedure Laterality Date  . COLONOSCOPY  06/29/2011   Normal  . COLONOSCOPY&ENDOSCOPY  1980's   PT UNSURE PLACE&MD WHO DID EXAMS=NORMAL  . DILATION AND CURETTAGE OF UTERUS    . LAPAROSCOPY     REMOVED SCARE TISSUE FROM OVARIES    Family History  Problem Relation Age of Onset  . Alcohol abuse Father   . Heart disease Father 74       AMI age 36  . Stroke Father 16       CVA  . Hypertension Mother   . Asthma Mother   . Hypertension Sister   . Hypertension Brother   . Hyperlipidemia Brother   . Hypertension Sister   . Hypertension Sister   . Hypertension Sister   . Hypertension Brother   . Colon cancer Neg Hx    Social History:  reports that she has never smoked. She has never used smokeless tobacco. She reports that she does not drink alcohol or use drugs.  Allergies:  Allergies  Allergen Reactions  . Shellfish Allergy Anaphylaxis  . Peanut-Containing Drug Products     Also walnuts and almonds    Medications:  Amlodipine Besylate  ASA  Symbicort Montelukast sodium Levocetirizine Fluticasone Proair HFA  (Not in a hospital admission)  No results found for this or any previous visit (from the past 48 hour(s)).  No results found.  Review of Systems  Constitutional: Negative.   HENT: Negative.   Eyes: Negative.   Respiratory: Negative.   Cardiovascular: Negative.   Gastrointestinal: Negative.   Genitourinary: Negative.   Musculoskeletal: Positive for joint pain.  Skin: Negative.   Neurological: Negative.   Psychiatric/Behavioral: Negative.     There were no vitals taken for this visit. Physical Exam  Constitutional: She is oriented to person, place, and time. She  appears well-developed.  HENT:  Head: Normocephalic.  Eyes: Pupils are equal, round, and reactive to light.  Neck: Normal range of motion.  Cardiovascular: Normal rate.  Respiratory: Effort normal.  GI: Soft.  Musculoskeletal:  Antalgic gait  Knees: Inspection Right: no deformity and swelling. Bony Palpation Right: no tenderness of the inferior pole patella, the superior pole patella, the tibial tubercle, Gerdy's tubercle, or the neck of fibula and tenderness of the lateral joint line, the medial joint line, the medial tibial plateau, and the lateral femoral condyle. Soft Tissue Palpation Right: no tenderness of the quadriceps tendon, the prepatellar bursa, the patellar tendon, the medial collateral ligament, the saphenous nerve, the lateral collateral ligament, the infrapatellar tendon, or the common peroneal nerve. Active Range of Motion Right: normal. Passive Range of Motion RIght: limited. Stability Right: no ligamentous instability, anterior drawer sign negative, posterior drawer sign negative, Lachman test negative, and laxity. Special Tests Right: McMurray's test negative. Strength Right: no hamstring weakness or quadriceps weakness and flexion 5/5 and extension 5/5.  Skin: Right Lower Extremity: normal.  Neurologic: Ankle Reflex Right: normal (2). Knee Reflex Right: normal (2). Sensation on the Right: L2 normal, L3 normal, L4 normal, L5 normal, and S1 normal.  Psychiatric: Mood and Affect: active and alert and normal mood.  Neurological: She is alert and oriented to person, place, and time.  Skin: Skin is warm and dry.     Prior x-rays reviewed, end-stage bone-on-bone medial joint space narrowing right knee with a varus deformity.  Assessment/Plan Impression: End-stage right knee osteoarthritis  Plan: Pt with end-stage Right knee DJD, bone-on-bone, refractory to conservative tx, scheduled for Right total knee replacement by Dr. Tonita Reyes on August 31, 2018. We again discussed the  procedure itself as well as risks, complications and alternatives, including but not limited to DVT, PE, infx, bleeding, failure of procedure, need for secondary procedure including manipulation, nerve injury, ongoing pain/symptoms, anesthesia risk, even stroke or death. Also discussed typical post-op protocols, activity restrictions, need for PT, flexion/extension exercises, time out of work. Discussed need for DVT ppx post-op per protocol. Discussed dental ppx and infx prevention. Also discussed limitations post-operatively such as kneeling and squatting. All questions were answered. Patient desires to proceed with surgery as scheduled.  Will hold supplements, ASA and NSAIDs accordingly. Will remain NPO after midnight the night before surgery. Will present to [facility] for pre-op testing. Anticipate hospital stay to include at least 2 midnights given medical history and to ensure proper pain control. Plan ASA 325mg  BID for DVT ppx post-op. Plan Percocet, Robaxin, Colace, Miralax. Plan home with HHPT post-op with family members at home for assistance. Will follow up 10-14 days post-op for suture removal and xrays. Given that she stands all day at work would anticipate return to work approximately 12 weeks postoperatively. This would allow plenty of time for swelling to improve prior to being on her feet all day. All her questions were answered and she desires to proceed.   Anticipated LOS equal to or greater than 2 midnights due to - Age  69 and older with one or more of the following:  - Obesity  - Expected need for hospital services (PT, OT, Nursing) required for safe  discharge  - Anticipated need for postoperative skilled nursing care or inpatient rehab  - Active co-morbidities: None   Plan RIGHT total knee replacement  [name]., PA-C for Dr. Tonita Reyes 08/22/2018, 9:08 AM

## 2018-08-22 NOTE — H&P (View-Only) (Signed)
Nicole Reyes is an 62 y.o. female.   Chief Complaint: right knee pain HPI: The patient is here for her H & P.  She is scheduled for a RIGHT total knee arthroplasty on 08/31/18 at [facility] by Dr. Tonita Cong.  She reports chronic progressively worsening pain in both knees, but the right is more significant than the left.  Symptoms are refractory to bracing, quad strengthening, medications, injection therapy including cortisone and Visco supplementation, activity modifications and relative rest.  Pain is interfering with quality-of-life and activities of daily living at this point and she desires to proceed with surgery.  Dr. Tonita Cong and the patient mutually agreed to proceed with a RIGHT total knee replacement. Risks and benefits of the procedure were discussed including stiffness, suboptimal range of motion, persistent pain, infection requiring removal of prosthesis and reinsertion, need for prophylactic antibiotics in the future, for example, dental procedures, possible need for manipulation, revision in the future and also anesthetic complications including DVT, PE, etc. We discussed the perioperative course, time in the hospital, postoperative recovery and the need for elevation to control swelling. We also discussed the predicted range of motion and the probability that squatting and kneeling would be unobtainable in the future. In addition, postoperative anticoagulation was discussed. We have obtained preoperative medical clearance as necessary. Provided illustrated handout and discussed it in detail. They will enroll in the total joint replacement educational forum at the hospital.   Her preoperative appointment at [facility] is on September 12.  She takes aspirin 325 mg daily with no GI upset.  She has no history personally or family history of DVT.  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  Dr. Tonita Cong had discussed  possible cortisone injection postoperatively in her left knee if needed, she reports the left knee is doing fairly well at this point, some pain but it is tolerable.  Past Medical History:  Diagnosis Date  . Allergy   . Anxiety   . Asthma   . Hypertension     Past Surgical History:  Procedure Laterality Date  . COLONOSCOPY  06/29/2011   Normal  . COLONOSCOPY&ENDOSCOPY  1980's   PT UNSURE PLACE&MD WHO DID EXAMS=NORMAL  . DILATION AND CURETTAGE OF UTERUS    . LAPAROSCOPY     REMOVED SCARE TISSUE FROM OVARIES    Family History  Problem Relation Age of Onset  . Alcohol abuse Father   . Heart disease Father 71       AMI age 47  . Stroke Father 34       CVA  . Hypertension Mother   . Asthma Mother   . Hypertension Sister   . Hypertension Brother   . Hyperlipidemia Brother   . Hypertension Sister   . Hypertension Sister   . Hypertension Sister   . Hypertension Brother   . Colon cancer Neg Hx    Social History:  reports that she has never smoked. She has never used smokeless tobacco. She reports that she does not drink alcohol or use drugs.  Allergies:  Allergies  Allergen Reactions  . Shellfish Allergy Anaphylaxis  . Peanut-Containing Drug Products     Also walnuts and almonds    Medications:  Amlodipine Besylate  ASA  Symbicort Montelukast sodium Levocetirizine Fluticasone Proair HFA  (Not in a hospital admission)  No results found for this or any previous visit (from the past 48 hour(s)).  No results found.  Review of Systems  Constitutional: Negative.   HENT: Negative.   Eyes: Negative.   Respiratory: Negative.   Cardiovascular: Negative.   Gastrointestinal: Negative.   Genitourinary: Negative.   Musculoskeletal: Positive for joint pain.  Skin: Negative.   Neurological: Negative.   Psychiatric/Behavioral: Negative.     There were no vitals taken for this visit. Physical Exam  Constitutional: She is oriented to person, place, and time. She  appears well-developed.  HENT:  Head: Normocephalic.  Eyes: Pupils are equal, round, and reactive to light.  Neck: Normal range of motion.  Cardiovascular: Normal rate.  Respiratory: Effort normal.  GI: Soft.  Musculoskeletal:  Antalgic gait  Knees: Inspection Right: no deformity and swelling. Bony Palpation Right: no tenderness of the inferior pole patella, the superior pole patella, the tibial tubercle, Gerdy's tubercle, or the neck of fibula and tenderness of the lateral joint line, the medial joint line, the medial tibial plateau, and the lateral femoral condyle. Soft Tissue Palpation Right: no tenderness of the quadriceps tendon, the prepatellar bursa, the patellar tendon, the medial collateral ligament, the saphenous nerve, the lateral collateral ligament, the infrapatellar tendon, or the common peroneal nerve. Active Range of Motion Right: normal. Passive Range of Motion RIght: limited. Stability Right: no ligamentous instability, anterior drawer sign negative, posterior drawer sign negative, Lachman test negative, and laxity. Special Tests Right: McMurray's test negative. Strength Right: no hamstring weakness or quadriceps weakness and flexion 5/5 and extension 5/5.  Skin: Right Lower Extremity: normal.  Neurologic: Ankle Reflex Right: normal (2). Knee Reflex Right: normal (2). Sensation on the Right: L2 normal, L3 normal, L4 normal, L5 normal, and S1 normal.  Psychiatric: Mood and Affect: active and alert and normal mood.  Neurological: She is alert and oriented to person, place, and time.  Skin: Skin is warm and dry.     Prior x-rays reviewed, end-stage bone-on-bone medial joint space narrowing right knee with a varus deformity.  Assessment/Plan Impression: End-stage right knee osteoarthritis  Plan: Pt with end-stage Right knee DJD, bone-on-bone, refractory to conservative tx, scheduled for Right total knee replacement by Dr. Tonita Cong on August 31, 2018. We again discussed the  procedure itself as well as risks, complications and alternatives, including but not limited to DVT, PE, infx, bleeding, failure of procedure, need for secondary procedure including manipulation, nerve injury, ongoing pain/symptoms, anesthesia risk, even stroke or death. Also discussed typical post-op protocols, activity restrictions, need for PT, flexion/extension exercises, time out of work. Discussed need for DVT ppx post-op per protocol. Discussed dental ppx and infx prevention. Also discussed limitations post-operatively such as kneeling and squatting. All questions were answered. Patient desires to proceed with surgery as scheduled.  Will hold supplements, ASA and NSAIDs accordingly. Will remain NPO after midnight the night before surgery. Will present to [facility] for pre-op testing. Anticipate hospital stay to include at least 2 midnights given medical history and to ensure proper pain control. Plan ASA 325mg  BID for DVT ppx post-op. Plan Percocet, Robaxin, Colace, Miralax. Plan home with HHPT post-op with family members at home for assistance. Will follow up 10-14 days post-op for suture removal and xrays. Given that she stands all day at work would anticipate return to work approximately 12 weeks postoperatively. This would allow plenty of time for swelling to improve prior to being on her feet all day. All her questions were answered and she desires to proceed.   Anticipated LOS equal to or greater than 2 midnights due to - Age  59 and older with one or more of the following:  - Obesity  - Expected need for hospital services (PT, OT, Nursing) required for safe  discharge  - Anticipated need for postoperative skilled nursing care or inpatient rehab  - Active co-morbidities: None   Plan RIGHT total knee replacement  [name]., PA-C for Dr. Tonita Cong 08/22/2018, 9:08 AM

## 2018-08-23 ENCOUNTER — Ambulatory Visit: Payer: 59 | Admitting: Allergy and Immunology

## 2018-08-25 ENCOUNTER — Encounter (HOSPITAL_COMMUNITY)
Admission: RE | Admit: 2018-08-25 | Discharge: 2018-08-25 | Disposition: A | Discharge status: Home / Self Care | Payer: 59 | Source: Ambulatory Visit | Attending: Specialist | Admitting: Specialist

## 2018-08-25 ENCOUNTER — Encounter (HOSPITAL_COMMUNITY): Payer: Self-pay

## 2018-08-25 ENCOUNTER — Other Ambulatory Visit: Payer: Self-pay

## 2018-08-25 DIAGNOSIS — M1711 Unilateral primary osteoarthritis, right knee: Secondary | ICD-10-CM | POA: Insufficient documentation

## 2018-08-25 DIAGNOSIS — I10 Essential (primary) hypertension: Secondary | ICD-10-CM | POA: Insufficient documentation

## 2018-08-25 DIAGNOSIS — Z01812 Encounter for preprocedural laboratory examination: Secondary | ICD-10-CM | POA: Insufficient documentation

## 2018-08-25 HISTORY — DX: Unspecified osteoarthritis, unspecified site: M19.90

## 2018-08-25 LAB — SURGICAL PCR SCREEN
MRSA, PCR: NEGATIVE
Staphylococcus aureus: NEGATIVE

## 2018-08-25 LAB — URINALYSIS, ROUTINE W REFLEX MICROSCOPIC
Bilirubin Urine: NEGATIVE
Glucose, UA: NEGATIVE mg/dL
Hgb urine dipstick: NEGATIVE
KETONES UR: NEGATIVE mg/dL
LEUKOCYTES UA: NEGATIVE
Nitrite: NEGATIVE
Protein, ur: NEGATIVE mg/dL
Specific Gravity, Urine: 1.011 (ref 1.005–1.030)
pH: 8 (ref 5.0–8.0)

## 2018-08-25 LAB — CBC
HCT: 43.4 % (ref 36.0–46.0)
HEMOGLOBIN: 13.8 g/dL (ref 12.0–15.0)
MCH: 26 pg (ref 26.0–34.0)
MCHC: 31.8 g/dL (ref 30.0–36.0)
MCV: 81.7 fL (ref 78.0–100.0)
PLATELETS: 363 10*3/uL (ref 150–400)
RBC: 5.31 MIL/uL — ABNORMAL HIGH (ref 3.87–5.11)
RDW: 13 % (ref 11.5–15.5)
WBC: 5.2 10*3/uL (ref 4.0–10.5)

## 2018-08-25 LAB — BASIC METABOLIC PANEL
ANION GAP: 10 (ref 5–15)
BUN: 10 mg/dL (ref 8–23)
CALCIUM: 9.4 mg/dL (ref 8.9–10.3)
CO2: 26 mmol/L (ref 22–32)
Chloride: 105 mmol/L (ref 98–111)
Creatinine, Ser: 0.71 mg/dL (ref 0.44–1.00)
Glucose, Bld: 101 mg/dL — ABNORMAL HIGH (ref 70–99)
Potassium: 3.6 mmol/L (ref 3.5–5.1)
SODIUM: 141 mmol/L (ref 135–145)

## 2018-08-25 LAB — PROTIME-INR
INR: 1
PROTHROMBIN TIME: 13.1 s (ref 11.4–15.2)

## 2018-08-25 LAB — APTT: aPTT: 29 seconds (ref 24–36)

## 2018-08-29 ENCOUNTER — Ambulatory Visit: Payer: Self-pay | Admitting: Orthopedic Surgery

## 2018-08-30 NOTE — Anesthesia Preprocedure Evaluation (Addendum)
Anesthesia Evaluation  Patient identified by MRN, date of birth, ID band Patient awake    Reviewed: Allergy & Precautions, NPO status , Patient's Chart, lab work & pertinent test results  History of Anesthesia Complications Negative for: history of anesthetic complications  Airway Mallampati: II  TM Distance: >3 FB Neck ROM: Full    Dental  (+) Chipped, Dental Advisory Given   Pulmonary COPD,  COPD inhaler,    breath sounds clear to auscultation       Cardiovascular hypertension, Pt. on medications (-) angina Rhythm:Regular Rate:Normal  '11 ECHO: EF 55-60%, valves OK   Neuro/Psych    GI/Hepatic negative GI ROS, Neg liver ROS,   Endo/Other  Morbid obesity  Renal/GU negative Renal ROS     Musculoskeletal  (+) Arthritis , Osteoarthritis,    Abdominal (+) + obese,   Peds  Hematology negative hematology ROS (+)   Anesthesia Other Findings   Reproductive/Obstetrics                            Anesthesia Physical Anesthesia Plan  ASA: II  Anesthesia Plan: Spinal   Post-op Pain Management:  Regional for Post-op pain   Induction:   PONV Risk Score and Plan: 2 and Ondansetron and Dexamethasone  Airway Management Planned: Natural Airway and Nasal Cannula  Additional Equipment:   Intra-op Plan:   Post-operative Plan:   Informed Consent: I have reviewed the patients History and Physical, chart, labs and discussed the procedure including the risks, benefits and alternatives for the proposed anesthesia with the patient or authorized representative who has indicated his/her understanding and acceptance.   Dental advisory given  Plan Discussed with: CRNA and Surgeon  Anesthesia Plan Comments: (Plan routine monitors, SAB with adductor canal block for post op analgesia)        Anesthesia Quick Evaluation

## 2018-08-31 ENCOUNTER — Inpatient Hospital Stay (HOSPITAL_COMMUNITY): Payer: 59 | Admitting: Anesthesiology

## 2018-08-31 ENCOUNTER — Encounter (HOSPITAL_COMMUNITY): Payer: Self-pay | Admitting: Anesthesiology

## 2018-08-31 ENCOUNTER — Inpatient Hospital Stay (HOSPITAL_COMMUNITY): Payer: 59

## 2018-08-31 ENCOUNTER — Other Ambulatory Visit: Payer: Self-pay

## 2018-08-31 ENCOUNTER — Inpatient Hospital Stay (HOSPITAL_COMMUNITY)
Admission: RE | Admit: 2018-08-31 | Discharge: 2018-09-03 | DRG: 470 | Disposition: A | Discharge status: Home Health | Payer: 59 | Attending: Specialist | Admitting: Specialist

## 2018-08-31 ENCOUNTER — Encounter (HOSPITAL_COMMUNITY)
Admission: RE | Disposition: A | Discharge status: Home Health | Payer: Self-pay | Source: Home / Self Care | Attending: Specialist

## 2018-08-31 DIAGNOSIS — M1711 Unilateral primary osteoarthritis, right knee: Secondary | ICD-10-CM | POA: Diagnosis present

## 2018-08-31 DIAGNOSIS — Z91010 Allergy to peanuts: Secondary | ICD-10-CM

## 2018-08-31 DIAGNOSIS — M21161 Varus deformity, not elsewhere classified, right knee: Secondary | ICD-10-CM | POA: Diagnosis present

## 2018-08-31 DIAGNOSIS — M25761 Osteophyte, right knee: Secondary | ICD-10-CM | POA: Diagnosis present

## 2018-08-31 DIAGNOSIS — Z7982 Long term (current) use of aspirin: Secondary | ICD-10-CM

## 2018-08-31 DIAGNOSIS — Z7951 Long term (current) use of inhaled steroids: Secondary | ICD-10-CM

## 2018-08-31 DIAGNOSIS — Z823 Family history of stroke: Secondary | ICD-10-CM

## 2018-08-31 DIAGNOSIS — E669 Obesity, unspecified: Secondary | ICD-10-CM | POA: Diagnosis present

## 2018-08-31 DIAGNOSIS — Z811 Family history of alcohol abuse and dependence: Secondary | ICD-10-CM

## 2018-08-31 DIAGNOSIS — Z8249 Family history of ischemic heart disease and other diseases of the circulatory system: Secondary | ICD-10-CM

## 2018-08-31 DIAGNOSIS — Z8349 Family history of other endocrine, nutritional and metabolic diseases: Secondary | ICD-10-CM | POA: Diagnosis not present

## 2018-08-31 DIAGNOSIS — Z825 Family history of asthma and other chronic lower respiratory diseases: Secondary | ICD-10-CM

## 2018-08-31 DIAGNOSIS — Z6836 Body mass index (BMI) 36.0-36.9, adult: Secondary | ICD-10-CM | POA: Diagnosis not present

## 2018-08-31 DIAGNOSIS — Z91013 Allergy to seafood: Secondary | ICD-10-CM

## 2018-08-31 DIAGNOSIS — Z79899 Other long term (current) drug therapy: Secondary | ICD-10-CM

## 2018-08-31 DIAGNOSIS — Z96659 Presence of unspecified artificial knee joint: Secondary | ICD-10-CM

## 2018-08-31 HISTORY — PX: TOTAL KNEE ARTHROPLASTY: SHX125

## 2018-08-31 SURGERY — ARTHROPLASTY, KNEE, TOTAL
Anesthesia: Spinal | Site: Knee | Laterality: Right

## 2018-08-31 MED ORDER — FENTANYL CITRATE (PF) 100 MCG/2ML IJ SOLN
50.0000 ug | INTRAMUSCULAR | Status: AC | PRN
  Administered 2018-08-31: 50 ug via INTRAVENOUS
  Administered 2018-08-31 (×2): 25 ug via INTRAVENOUS
  Filled 2018-08-31: qty 2

## 2018-08-31 MED ORDER — ALBUTEROL SULFATE (2.5 MG/3ML) 0.083% IN NEBU: 2.5000 mg | INHALATION_SOLUTION | Freq: Four times a day (QID) | RESPIRATORY_TRACT | Status: DC | PRN

## 2018-08-31 MED ORDER — LACTATED RINGERS IV SOLN: INTRAVENOUS | Status: DC

## 2018-08-31 MED ORDER — OXYCODONE HCL 5 MG PO TABS
10.0000 mg | ORAL_TABLET | ORAL | Status: DC | PRN
  Administered 2018-08-31 – 2018-09-01 (×3): 15 mg via ORAL
  Administered 2018-09-01: 10 mg via ORAL
  Administered 2018-09-02 – 2018-09-03 (×4): 15 mg via ORAL
  Filled 2018-08-31 (×10): qty 3

## 2018-08-31 MED ORDER — PHENYLEPHRINE 40 MCG/ML (10ML) SYRINGE FOR IV PUSH (FOR BLOOD PRESSURE SUPPORT)
PREFILLED_SYRINGE | INTRAVENOUS | Status: AC
  Filled 2018-08-31: qty 10

## 2018-08-31 MED ORDER — DOCUSATE SODIUM 100 MG PO CAPS
100.0000 mg | ORAL_CAPSULE | Freq: Two times a day (BID) | ORAL | Status: DC
  Administered 2018-08-31 – 2018-09-03 (×6): 100 mg via ORAL
  Filled 2018-08-31 (×6): qty 1

## 2018-08-31 MED ORDER — ALBUTEROL SULFATE HFA 108 (90 BASE) MCG/ACT IN AERS: 1.0000 | INHALATION_SPRAY | Freq: Four times a day (QID) | RESPIRATORY_TRACT | Status: DC | PRN

## 2018-08-31 MED ORDER — CLOTRIMAZOLE 1 % EX CREA
TOPICAL_CREAM | Freq: Two times a day (BID) | CUTANEOUS | Status: DC
  Administered 2018-09-02 – 2018-09-03 (×3): via TOPICAL
  Filled 2018-08-31 (×2): qty 15

## 2018-08-31 MED ORDER — SODIUM CHLORIDE 0.9 % IV SOLN
INTRAVENOUS | Status: AC
  Filled 2018-08-31: qty 500000

## 2018-08-31 MED ORDER — MIDAZOLAM HCL 2 MG/2ML IJ SOLN: 0.5000 mg | Freq: Once | INTRAMUSCULAR | Status: DC | PRN

## 2018-08-31 MED ORDER — AMLODIPINE BESYLATE 10 MG PO TABS
10.0000 mg | ORAL_TABLET | Freq: Every day | ORAL | Status: DC
  Administered 2018-09-01 – 2018-09-03 (×3): 10 mg via ORAL
  Filled 2018-08-31 (×3): qty 1

## 2018-08-31 MED ORDER — METHOCARBAMOL 500 MG PO TABS
500.0000 mg | ORAL_TABLET | Freq: Four times a day (QID) | ORAL | Status: DC | PRN
  Administered 2018-08-31 – 2018-09-03 (×8): 500 mg via ORAL
  Filled 2018-08-31 (×8): qty 1

## 2018-08-31 MED ORDER — BUPIVACAINE-EPINEPHRINE 0.25% -1:200000 IJ SOLN
INTRAMUSCULAR | Status: DC | PRN
  Administered 2018-08-31: 30 mL

## 2018-08-31 MED ORDER — HYDROMORPHONE HCL 1 MG/ML IJ SOLN
0.5000 mg | INTRAMUSCULAR | Status: DC | PRN
  Administered 2018-08-31: 1 mg via INTRAVENOUS
  Filled 2018-08-31 (×2): qty 1

## 2018-08-31 MED ORDER — ASPIRIN EC 325 MG PO TBEC
325.0000 mg | DELAYED_RELEASE_TABLET | Freq: Every day | ORAL | Status: DC
  Administered 2018-09-01 – 2018-09-03 (×3): 325 mg via ORAL
  Filled 2018-08-31 (×3): qty 1

## 2018-08-31 MED ORDER — DOCUSATE SODIUM 100 MG PO CAPS: 100.0000 mg | ORAL_CAPSULE | Freq: Two times a day (BID) | ORAL | 2 refills | Status: DC

## 2018-08-31 MED ORDER — METOCLOPRAMIDE HCL 5 MG/ML IJ SOLN: 5.0000 mg | Freq: Three times a day (TID) | INTRAMUSCULAR | Status: DC | PRN

## 2018-08-31 MED ORDER — ACETAMINOPHEN 500 MG PO TABS
1000.0000 mg | ORAL_TABLET | Freq: Four times a day (QID) | ORAL | Status: AC
  Administered 2018-08-31 – 2018-09-01 (×3): 1000 mg via ORAL
  Filled 2018-08-31 (×4): qty 2

## 2018-08-31 MED ORDER — ACETAMINOPHEN 325 MG PO TABS
325.0000 mg | ORAL_TABLET | Freq: Four times a day (QID) | ORAL | Status: DC | PRN
  Administered 2018-09-01: 650 mg via ORAL
  Filled 2018-08-31: qty 2

## 2018-08-31 MED ORDER — ASPIRIN EC 325 MG PO TBEC: 325.0000 mg | DELAYED_RELEASE_TABLET | Freq: Every day | ORAL | 1 refills | Status: AC

## 2018-08-31 MED ORDER — KCL IN DEXTROSE-NACL 20-5-0.45 MEQ/L-%-% IV SOLN
INTRAVENOUS | Status: AC
  Administered 2018-08-31 – 2018-09-01 (×2): via INTRAVENOUS
  Filled 2018-08-31 (×2): qty 1000

## 2018-08-31 MED ORDER — METHOCARBAMOL 500 MG PO TABS: 500.0000 mg | ORAL_TABLET | Freq: Four times a day (QID) | ORAL | 1 refills | Status: DC | PRN

## 2018-08-31 MED ORDER — MEPERIDINE HCL 50 MG/ML IJ SOLN: 6.2500 mg | INTRAMUSCULAR | Status: DC | PRN

## 2018-08-31 MED ORDER — ONDANSETRON HCL 4 MG/2ML IJ SOLN
INTRAMUSCULAR | Status: DC | PRN
  Administered 2018-08-31: 4 mg via INTRAVENOUS

## 2018-08-31 MED ORDER — MENTHOL 3 MG MT LOZG: 1.0000 | LOZENGE | OROMUCOSAL | Status: DC | PRN

## 2018-08-31 MED ORDER — BUPIVACAINE-EPINEPHRINE (PF) 0.25% -1:200000 IJ SOLN
INTRAMUSCULAR | Status: AC
  Filled 2018-08-31: qty 30

## 2018-08-31 MED ORDER — STERILE WATER FOR IRRIGATION IR SOLN
Status: DC | PRN
  Administered 2018-08-31: 2000 mL

## 2018-08-31 MED ORDER — PROMETHAZINE HCL 25 MG/ML IJ SOLN: 6.2500 mg | INTRAMUSCULAR | Status: DC | PRN

## 2018-08-31 MED ORDER — METHOCARBAMOL 500 MG IVPB - SIMPLE MED
500.0000 mg | Freq: Four times a day (QID) | INTRAVENOUS | Status: DC | PRN
  Filled 2018-08-31: qty 50

## 2018-08-31 MED ORDER — TRANEXAMIC ACID 1000 MG/10ML IV SOLN
1000.0000 mg | INTRAVENOUS | Status: AC
  Administered 2018-08-31: 1000 mg via INTRAVENOUS
  Filled 2018-08-31: qty 10

## 2018-08-31 MED ORDER — POLYETHYLENE GLYCOL 3350 17 G PO PACK
17.0000 g | PACK | Freq: Every day | ORAL | Status: DC | PRN
  Administered 2018-09-02: 17 g via ORAL
  Filled 2018-08-31: qty 1

## 2018-08-31 MED ORDER — HYDROMORPHONE HCL 1 MG/ML IJ SOLN
0.5000 mg | INTRAMUSCULAR | Status: DC | PRN
  Administered 2018-08-31 – 2018-09-02 (×8): 1 mg via INTRAVENOUS
  Filled 2018-08-31 (×7): qty 1

## 2018-08-31 MED ORDER — MOMETASONE FURO-FORMOTEROL FUM 100-5 MCG/ACT IN AERO
2.0000 | INHALATION_SPRAY | Freq: Two times a day (BID) | RESPIRATORY_TRACT | Status: DC
  Administered 2018-08-31 – 2018-09-03 (×5): 2 via RESPIRATORY_TRACT
  Filled 2018-08-31: qty 8.8

## 2018-08-31 MED ORDER — FLUTICASONE PROPIONATE 50 MCG/ACT NA SUSP
1.0000 | Freq: Two times a day (BID) | NASAL | Status: DC | PRN
  Administered 2018-09-02: 1 via NASAL
  Filled 2018-08-31: qty 16

## 2018-08-31 MED ORDER — OXYCODONE HCL 5 MG PO TABS: 5.0000 mg | ORAL_TABLET | ORAL | 0 refills | Status: DC | PRN

## 2018-08-31 MED ORDER — OXYCODONE HCL 5 MG PO TABS
5.0000 mg | ORAL_TABLET | ORAL | Status: DC | PRN
  Administered 2018-08-31: 10 mg via ORAL
  Administered 2018-08-31 – 2018-09-01 (×2): 5 mg via ORAL
  Administered 2018-09-02 (×3): 10 mg via ORAL
  Filled 2018-08-31: qty 1
  Filled 2018-08-31 (×3): qty 2
  Filled 2018-08-31 (×2): qty 1

## 2018-08-31 MED ORDER — GABAPENTIN 300 MG PO CAPS
300.0000 mg | ORAL_CAPSULE | Freq: Three times a day (TID) | ORAL | Status: DC
  Administered 2018-08-31 – 2018-09-03 (×9): 300 mg via ORAL
  Filled 2018-08-31 (×9): qty 1

## 2018-08-31 MED ORDER — MAGNESIUM CITRATE PO SOLN: 1.0000 | Freq: Once | ORAL | Status: DC | PRN

## 2018-08-31 MED ORDER — PHENOL 1.4 % MT LIQD
1.0000 | OROMUCOSAL | Status: DC | PRN
  Filled 2018-08-31: qty 177

## 2018-08-31 MED ORDER — LEVOCETIRIZINE DIHYDROCHLORIDE 5 MG PO TABS: 5.0000 mg | ORAL_TABLET | Freq: Every evening | ORAL | Status: DC

## 2018-08-31 MED ORDER — RISAQUAD PO CAPS
1.0000 | ORAL_CAPSULE | Freq: Every day | ORAL | Status: DC
  Administered 2018-08-31 – 2018-09-03 (×4): 1 via ORAL
  Filled 2018-08-31 (×4): qty 1

## 2018-08-31 MED ORDER — LORATADINE 10 MG PO TABS
10.0000 mg | ORAL_TABLET | Freq: Every evening | ORAL | Status: DC
  Administered 2018-08-31 – 2018-09-02 (×3): 10 mg via ORAL
  Filled 2018-08-31 (×4): qty 1

## 2018-08-31 MED ORDER — DIPHENHYDRAMINE HCL 12.5 MG/5ML PO ELIX
12.5000 mg | ORAL_SOLUTION | ORAL | Status: DC | PRN
  Administered 2018-09-02: 25 mg via ORAL
  Filled 2018-08-31: qty 10

## 2018-08-31 MED ORDER — SODIUM CHLORIDE 0.9 % IV SOLN
INTRAVENOUS | Status: DC | PRN
  Administered 2018-08-31: 500 mL

## 2018-08-31 MED ORDER — SODIUM CHLORIDE 0.9 % IR SOLN
Status: DC | PRN
  Administered 2018-08-31: 1000 mL

## 2018-08-31 MED ORDER — ACETAMINOPHEN 10 MG/ML IV SOLN
1000.0000 mg | INTRAVENOUS | Status: AC
  Administered 2018-08-31: 1000 mg via INTRAVENOUS
  Filled 2018-08-31: qty 100

## 2018-08-31 MED ORDER — ALUM & MAG HYDROXIDE-SIMETH 200-200-20 MG/5ML PO SUSP: 30.0000 mL | ORAL | Status: DC | PRN

## 2018-08-31 MED ORDER — CEFAZOLIN SODIUM-DEXTROSE 2-4 GM/100ML-% IV SOLN
2.0000 g | Freq: Four times a day (QID) | INTRAVENOUS | Status: AC
  Administered 2018-08-31 – 2018-09-01 (×3): 2 g via INTRAVENOUS
  Filled 2018-08-31 (×3): qty 100

## 2018-08-31 MED ORDER — MULTIVITAMIN ADULT PO CHEW: 2.0000 | CHEWABLE_TABLET | Freq: Every day | ORAL | Status: DC

## 2018-08-31 MED ORDER — MONTELUKAST SODIUM 10 MG PO TABS
10.0000 mg | ORAL_TABLET | Freq: Every day | ORAL | Status: DC
  Administered 2018-08-31 – 2018-09-02 (×2): 10 mg via ORAL
  Filled 2018-08-31 (×3): qty 1

## 2018-08-31 MED ORDER — LORATADINE 10 MG PO TABS: 10.0000 mg | ORAL_TABLET | Freq: Every day | ORAL | Status: DC

## 2018-08-31 MED ORDER — ONDANSETRON HCL 4 MG PO TABS
4.0000 mg | ORAL_TABLET | Freq: Four times a day (QID) | ORAL | Status: DC | PRN
  Administered 2018-09-01: 4 mg via ORAL
  Filled 2018-08-31: qty 1

## 2018-08-31 MED ORDER — PHENYLEPHRINE 40 MCG/ML (10ML) SYRINGE FOR IV PUSH (FOR BLOOD PRESSURE SUPPORT)
PREFILLED_SYRINGE | INTRAVENOUS | Status: DC | PRN
  Administered 2018-08-31 (×4): 80 ug via INTRAVENOUS
  Administered 2018-08-31: 40 ug via INTRAVENOUS
  Administered 2018-08-31 (×6): 80 ug via INTRAVENOUS
  Administered 2018-08-31: 40 ug via INTRAVENOUS

## 2018-08-31 MED ORDER — ROPIVACAINE HCL 7.5 MG/ML IJ SOLN
INTRAMUSCULAR | Status: DC | PRN
  Administered 2018-08-31: 20 mL via PERINEURAL

## 2018-08-31 MED ORDER — OLOPATADINE HCL 0.1 % OP SOLN
1.0000 [drp] | Freq: Two times a day (BID) | OPHTHALMIC | Status: DC
  Administered 2018-08-31 – 2018-09-03 (×6): 1 [drp] via OPHTHALMIC
  Filled 2018-08-31: qty 5

## 2018-08-31 MED ORDER — MIDAZOLAM HCL 2 MG/2ML IJ SOLN
1.0000 mg | INTRAMUSCULAR | Status: DC | PRN
  Administered 2018-08-31: 2 mg via INTRAVENOUS
  Filled 2018-08-31: qty 2

## 2018-08-31 MED ORDER — PROPOFOL 500 MG/50ML IV EMUL
INTRAVENOUS | Status: DC | PRN
  Administered 2018-08-31: 75 ug/kg/min via INTRAVENOUS

## 2018-08-31 MED ORDER — BUPIVACAINE IN DEXTROSE 0.75-8.25 % IT SOLN
INTRATHECAL | Status: DC | PRN
  Administered 2018-08-31: 1.8 mL via INTRATHECAL

## 2018-08-31 MED ORDER — BISACODYL 5 MG PO TBEC: 5.0000 mg | DELAYED_RELEASE_TABLET | Freq: Every day | ORAL | Status: DC | PRN

## 2018-08-31 MED ORDER — POLYETHYLENE GLYCOL 3350 17 G PO PACK: 17.0000 g | PACK | Freq: Every day | ORAL | 0 refills | Status: DC

## 2018-08-31 MED ORDER — HYDROMORPHONE HCL 1 MG/ML IJ SOLN: 0.2500 mg | INTRAMUSCULAR | Status: DC | PRN

## 2018-08-31 MED ORDER — LACTATED RINGERS IV SOLN
INTRAVENOUS | Status: DC
  Administered 2018-08-31: 09:00:00 via INTRAVENOUS
  Administered 2018-08-31: 1000 mL via INTRAVENOUS

## 2018-08-31 MED ORDER — CEFAZOLIN SODIUM-DEXTROSE 2-4 GM/100ML-% IV SOLN
2.0000 g | INTRAVENOUS | Status: AC
  Administered 2018-08-31: 2 g via INTRAVENOUS
  Filled 2018-08-31: qty 100

## 2018-08-31 MED ORDER — DEXAMETHASONE SODIUM PHOSPHATE 10 MG/ML IJ SOLN
INTRAMUSCULAR | Status: DC | PRN
  Administered 2018-08-31: 10 mg via INTRAVENOUS

## 2018-08-31 MED ORDER — METOCLOPRAMIDE HCL 5 MG PO TABS
5.0000 mg | ORAL_TABLET | Freq: Three times a day (TID) | ORAL | Status: DC | PRN
  Administered 2018-08-31 – 2018-09-01 (×2): 10 mg via ORAL
  Filled 2018-08-31 (×2): qty 2

## 2018-08-31 MED ORDER — ADULT MULTIVITAMIN W/MINERALS CH
1.0000 | ORAL_TABLET | Freq: Every day | ORAL | Status: DC
  Administered 2018-09-01 – 2018-09-03 (×3): 1 via ORAL
  Filled 2018-08-31 (×3): qty 1

## 2018-08-31 MED ORDER — ONDANSETRON HCL 4 MG/2ML IJ SOLN
4.0000 mg | Freq: Four times a day (QID) | INTRAMUSCULAR | Status: DC | PRN
  Administered 2018-08-31 – 2018-09-01 (×2): 4 mg via INTRAVENOUS
  Filled 2018-08-31 (×2): qty 2

## 2018-08-31 SURGICAL SUPPLY — 73 items
ATTUNE PS FEM RT SZ 6 CEM KNEE (Femur) ×3 IMPLANT
ATTUNE PSRP INSR SZ6 5 KNEE (Insert) ×2 IMPLANT
ATTUNE PSRP INSR SZ6 5MM KNEE (Insert) ×1 IMPLANT
BAG ZIPLOCK 12X15 (MISCELLANEOUS) IMPLANT
BANDAGE ACE 4X5 VEL STRL LF (GAUZE/BANDAGES/DRESSINGS) ×3 IMPLANT
BANDAGE ACE 6X5 VEL STRL LF (GAUZE/BANDAGES/DRESSINGS) ×3 IMPLANT
BASE TIBIA ATTUNE KNEE SYS SZ6 (Knees) ×1 IMPLANT
BLADE SAG 18X100X1.27 (BLADE) ×3 IMPLANT
BLADE SAW SGTL 11.0X1.19X90.0M (BLADE) ×3 IMPLANT
BLADE SAW SGTL 13.0X1.19X90.0M (BLADE) ×3 IMPLANT
CEMENT HV SMART SET (Cement) ×6 IMPLANT
CLOSURE WOUND 1/2 X4 (GAUZE/BANDAGES/DRESSINGS) ×1
CLOTH 2% CHLOROHEXIDINE 3PK (PERSONAL CARE ITEMS) ×3 IMPLANT
COVER SURGICAL LIGHT HANDLE (MISCELLANEOUS) ×3 IMPLANT
CUFF TOURN SGL QUICK 34 (TOURNIQUET CUFF) ×2
CUFF TRNQT CYL 34X4X40X1 (TOURNIQUET CUFF) ×1 IMPLANT
DECANTER SPIKE VIAL GLASS SM (MISCELLANEOUS) ×3 IMPLANT
DRAPE INCISE IOBAN 66X45 STRL (DRAPES) IMPLANT
DRAPE LG THREE QUARTER DISP (DRAPES) ×2 IMPLANT
DRAPE ORTHO SPLIT 77X108 STRL (DRAPES) ×4
DRAPE SHEET LG 3/4 BI-LAMINATE (DRAPES) ×3 IMPLANT
DRAPE SURG ORHT 6 SPLT 77X108 (DRAPES) ×2 IMPLANT
DRAPE U-SHAPE 47X51 STRL (DRAPES) ×3 IMPLANT
DRESSING AQUACEL AG SP 3.5X10 (GAUZE/BANDAGES/DRESSINGS) ×1 IMPLANT
DRSG AQUACEL AG ADV 3.5X10 (GAUZE/BANDAGES/DRESSINGS) IMPLANT
DRSG AQUACEL AG SP 3.5X10 (GAUZE/BANDAGES/DRESSINGS) ×3
DRSG TEGADERM 4X4.75 (GAUZE/BANDAGES/DRESSINGS) IMPLANT
DURAPREP 26ML APPLICATOR (WOUND CARE) ×3 IMPLANT
ELECT BLADE TIP CTD 4 INCH (ELECTRODE) ×3 IMPLANT
ELECT REM PT RETURN 15FT ADLT (MISCELLANEOUS) ×3 IMPLANT
EVACUATOR 1/8 PVC DRAIN (DRAIN) IMPLANT
GAUZE SPONGE 2X2 8PLY STRL LF (GAUZE/BANDAGES/DRESSINGS) IMPLANT
GLOVE BIOGEL PI IND STRL 7.5 (GLOVE) ×1 IMPLANT
GLOVE BIOGEL PI IND STRL 8 (GLOVE) ×1 IMPLANT
GLOVE BIOGEL PI INDICATOR 7.5 (GLOVE) ×2
GLOVE BIOGEL PI INDICATOR 8 (GLOVE) ×2
GLOVE SURG SS PI 7.5 STRL IVOR (GLOVE) ×6 IMPLANT
GLOVE SURG SS PI 8.0 STRL IVOR (GLOVE) ×6 IMPLANT
GOWN STRL REUS W/TWL XL LVL3 (GOWN DISPOSABLE) ×12 IMPLANT
HANDPIECE INTERPULSE COAX TIP (DISPOSABLE) ×2
HEMOSTAT SPONGE AVITENE ULTRA (HEMOSTASIS) ×3 IMPLANT
HOLDER FOLEY CATH W/STRAP (MISCELLANEOUS) ×3 IMPLANT
IMMOBILIZER KNEE 20 (SOFTGOODS) ×3
IMMOBILIZER KNEE 20 THIGH 36 (SOFTGOODS) ×1 IMPLANT
MANIFOLD NEPTUNE II (INSTRUMENTS) ×3 IMPLANT
NDL SAFETY ECLIPSE 18X1.5 (NEEDLE) IMPLANT
NEEDLE HYPO 18GX1.5 SHARP (NEEDLE)
NS IRRIG 1000ML POUR BTL (IV SOLUTION) IMPLANT
PACK TOTAL KNEE CUSTOM (KITS) ×3 IMPLANT
PATELLA MEDIAL ATTUN 35MM KNEE (Knees) ×3 IMPLANT
POSITIONER SURGICAL ARM (MISCELLANEOUS) ×3 IMPLANT
SEALER BIPOLAR AQUA 6.0 (INSTRUMENTS) IMPLANT
SET HNDPC FAN SPRY TIP SCT (DISPOSABLE) ×1 IMPLANT
SPONGE GAUZE 2X2 STER 10/PKG (GAUZE/BANDAGES/DRESSINGS)
SPONGE SURGIFOAM ABS GEL 100 (HEMOSTASIS) ×3 IMPLANT
STAPLER VISISTAT (STAPLE) IMPLANT
STRIP CLOSURE SKIN 1/2X4 (GAUZE/BANDAGES/DRESSINGS) ×2 IMPLANT
SUT BONE WAX W31G (SUTURE) IMPLANT
SUT MNCRL AB 4-0 PS2 18 (SUTURE) IMPLANT
SUT STRATAFIX 0 PDS 27 VIOLET (SUTURE) ×3
SUT VIC AB 1 CT1 27 (SUTURE) ×6
SUT VIC AB 1 CT1 27XBRD ANTBC (SUTURE) ×3 IMPLANT
SUT VIC AB 1 CTX 36 (SUTURE)
SUT VIC AB 1 CTX36XBRD ANBCTR (SUTURE) IMPLANT
SUT VIC AB 2-0 CT1 27 (SUTURE) ×6
SUT VIC AB 2-0 CT1 TAPERPNT 27 (SUTURE) ×3 IMPLANT
SUTURE STRATFX 0 PDS 27 VIOLET (SUTURE) ×1 IMPLANT
SYR 3ML LL SCALE MARK (SYRINGE) IMPLANT
SYR 50ML LL SCALE MARK (SYRINGE) IMPLANT
TIBIA ATTUNE KNEE SYS BASE SZ6 (Knees) ×3 IMPLANT
TOWER CARTRIDGE SMART MIX (DISPOSABLE) ×3 IMPLANT
WRAP KNEE MAXI GEL POST OP (GAUZE/BANDAGES/DRESSINGS) ×3 IMPLANT
YANKAUER SUCT BULB TIP 10FT TU (MISCELLANEOUS) ×3 IMPLANT

## 2018-08-31 NOTE — Transfer of Care (Signed)
Immediate Anesthesia Transfer of Care Note  Patient: Nicole Reyes  Procedure(s) Performed: Procedure(s): RIGHT  TOTAL KNEE ARTHROPLASTY (Right)  Patient Location: PACU  Anesthesia Type:Spinal  Level of Consciousness:  sedated, patient cooperative and responds to stimulation  Airway & Oxygen Therapy:Patient Spontanous Breathing and Patient connected to face mask oxgen  Post-op Assessment:  Report given to PACU RN and Post -op Vital signs reviewed and stable  Post vital signs:  Reviewed and stable  Last Vitals:  Vitals:   08/31/18 0640 08/31/18 0655  BP: (!) 142/120 (!) 145/80  Pulse: 79   Resp: 18   Temp: 36.6 C   SpO2: 174%     Complications: No apparent anesthesia complications

## 2018-08-31 NOTE — Interval H&P Note (Signed)
History and Physical Interval Note:  08/31/2018 8:22 AM  Nicole Reyes  has presented today for surgery, with the diagnosis of Dengenerative joint disease RIGHT knee  The various methods of treatment have been discussed with the patient and family. After consideration of risks, benefits and other options for treatment, the patient has consented to  Procedure(s): RIGHT  TOTAL KNEE ARTHROPLASTY (Right) as a surgical intervention .  The patient's history has been reviewed, patient examined, no change in status, stable for surgery.  I have reviewed the patient's chart and labs.  Questions were answered to the patient's satisfaction.     Crisol Muecke C

## 2018-08-31 NOTE — Brief Op Note (Signed)
08/31/2018  11:02 AM  PATIENT:  Nicole Reyes  62 y.o. female  PRE-OPERATIVE DIAGNOSIS:  Dengenerative joint disease RIGHT knee  POST-OPERATIVE DIAGNOSIS:  Dengenerative joint disease RIGHT knee  PROCEDURE:  Procedure(s): RIGHT  TOTAL KNEE ARTHROPLASTY (Right)  SURGEON:  Surgeon(s) and Role:    Susa Day, MD - Primary  PHYSICIAN ASSISTANT:   ASSISTANTS: Bissell   ANESTHESIA:   general  EBL:  50 mL   BLOOD ADMINISTERED:none  DRAINS: none   LOCAL MEDICATIONS USED:  MARCAINE     SPECIMEN:  No Specimen  DISPOSITION OF SPECIMEN:  PATHOLOGY  COUNTS:  YES  TOURNIQUET:   Total Tourniquet Time Documented: Thigh (Right) - 78 minutes Total: Thigh (Right) - 78 minutes   DICTATION: .Other Dictation: Dictation Number [id number] ( Last 2 digits not heard)  PLAN OF CARE: Admit to inpatient   PATIENT DISPOSITION:  PACU - hemodynamically stable.   Delay start of Pharmacological VTE agent (>24hrs) due to surgical blood loss or risk of bleeding: not applicable

## 2018-08-31 NOTE — Anesthesia Postprocedure Evaluation (Signed)
Anesthesia Post Note  Patient: Nicole Reyes  Procedure(s) Performed: RIGHT  TOTAL KNEE ARTHROPLASTY (Right Knee)     Patient location during evaluation: PACU Anesthesia Type: Spinal and Regional Level of consciousness: awake and alert, oriented and patient cooperative Pain management: pain level controlled Vital Signs Assessment: post-procedure vital signs reviewed and stable Respiratory status: spontaneous breathing, nonlabored ventilation and respiratory function stable Cardiovascular status: stable and blood pressure returned to baseline Postop Assessment: patient able to bend at knees, spinal receding and no apparent nausea or vomiting Anesthetic complications: no    Last Vitals:  Vitals:   08/31/18 1145 08/31/18 1211  BP: 120/81 126/78  Pulse: 78 78  Resp: 15 16  Temp: 36.6 C 36.5 C  SpO2: 98%     Last Pain:  Vitals:   08/31/18 1220  TempSrc:   PainSc: 0-No pain                 Krisa Blattner,E. Cedrick Partain

## 2018-08-31 NOTE — Discharge Instructions (Signed)

## 2018-08-31 NOTE — Op Note (Signed)
NAME: KAITLYNNE, WENZ MEDICAL RECORD [id number] ACCOUNT [id number] DATE OF BIRTH:1956-02-17 FACILITY: [facility] LOCATION: [id number] PHYSICIAN:Asher Babilonia Windy Kalata, MD  OPERATIVE REPORT  DATE OF PROCEDURE:  08/31/2018  PREOPERATIVE DIAGNOSIS:  End-stage osteoarthritis of the right knee with a varus deformity.  POSTOPERATIVE DIAGNOSIS:  End-stage osteoarthritis of the right knee with a varus deformity.  PROCEDURE PERFORMED:  Right total knee arthroplasty utilizing DePuy rotating platform Attune #6 femur, #6 tibia, 5 mm insert, 38 patella.  ANESTHESIA:  Spinal.  ASSISTANT:  Lacie Draft, PA.  HISTORY:  A 62 year old with deformity bone-on-bone medial compartment, patellofemoral joint, indicated for replacement of the degenerative joint failing conservative treatment.  Negative affect to her activities of daily living.  Risks and benefits  discussed including bleeding, infection, damage to neurovascular structures, no change in symptoms, worsening symptoms, DVT, PE, anesthetic complications, etc., component revision scar tissue, need for manipulation.  DESCRIPTION OF PROCEDURE:  The patient in supine position.  After induction of adequate spinal anesthesia, the right lower extremity was prepped and draped and exsanguinated in usual sterile fashion.  Thigh tourniquet inflated to 250 mmHg.  Midline  incision was then made over the knee.  Full thickness flaps developed.  Median parapatellar arthrotomy performed.  Elevated soft tissues medially, preserving the MCL.  Patella everted, knee flexed.  Tricompartmental osteoarthrosis was noted particularly  medially, posteriorly.  The osteophytes were removed with a rongeur.  Remnants of the medial and lateral menisci and the ACL were removed.  Step drill was utilized to enter the femoral canal, was irrigated.  A 5 degree right was utilized with 9 off the  distal femur.  We then performed that cut.  We then subluxed the tibia.  External alignment guide  bisecting the tibiotalar joint, 3 degree slope parallel to the shaft, attended off the posterior medially portion and performed our cut.  Our extension  flexion gap was slightly tight, so we took an additional 2 mm off the tibia.  Prior to that, we had sized the femur to a 6 off the anterior cortex, pinned it in 3 degrees of external rotation and performed the anterior, posterior and chamfer cuts. We  then subluxed the tibia, measured it to a 6 just to the medial aspect of the tibial tubercle maximizing the coverage.  We pinned it, drilled it centrally, harvested bone placed in the distal femur.  We used our punch guide.  I then performed our notch  cut bisecting the femur.  I placed a trial femur and drilled the lug holes.  Trial tibia, 5 insert, reduced it.  We had full extension and full flexion, good stability to varus valgus stress 0-30 degrees.  We then everted the patella, measured at 25  pointing to a 15 with an external alignment jig.  Drilled the peg holes medializing them parallel to the tibia.  We first tried a 42 and then a 38.  The 38 was satisfactory.  There were some adhesions that were released as well.  We had excellent  patellofemoral tracking.  All trials were removed.  Checked posteriorly.  The popliteus was intact.  We cauterized any geniculates.  Osteophytes removed, the soft tissue protected posteriorly at all times.  Pulsatile lavage was utilized to clean the  joint thoroughly.  It was then flexed.  Tibia subluxed.  All surfaces thoroughly dried.  Next, cement on the back table in the appropriate fashion, injected into the proximal tibia digitally pressurizing it.  We cemented the 6 tibia tray with cement on  the  tibia and on the implant.  We impacted it.  Redundant cement removed.  We cemented and impacted the femur.  Redundant cement removed.  I placed a 5 trial insert, reduced and held it with an axial load throughout the curing of the cement.  Cemented  and clamped the patella.   During the curing of the cement.  Redundant cement was removed.  Periosteum was anesthetized with 0.25% Marcaine with epinephrine.  The wound was covered.  After appropriate curing of the cement, tourniquet was deflated at 75  minutes.  Any bleeding was cauterized.  Bone wax on any cancellous surfaces.  Then, the knee was flexed.  We selected the 5 and meticulously removing any redundant cement.  We removed the trial.  Redundant cement removed, copiously irrigated with  pulsatile lavage antibiotic irrigation.  Subluxed the tibia and inserted a 5 permanent polyethylene insert, reduced it had full extension, full flexion, good stability to varus valgus stress in 30 degrees.  Negative anterior drawer.  The knee was then  placed in slight flexion.  The patella arthrotomy repaired with #1 Vicryl interrupted figure-of-eight sutures oversewn with a running Stratafix.  Subcutaneous with 2-0 and skin with a subcuticular Monocryl.  The patient had flexion to gravity at 90  degrees and at full flexion and full extension following closure and excellent patellofemoral tracking.  Sterile dressing applied, placed in immobilizer and transported to the recovery room in satisfactory condition.  The patient tolerated the procedure well.  There were no complications.  Assistant was [employer], [place].  TN/NUANCE  D:08/31/2018 T:08/31/2018 JOB:[id number]

## 2018-08-31 NOTE — Anesthesia Procedure Notes (Signed)
Spinal  Patient location during procedure: OR Start time: 08/31/2018 8:30 AM End time: 08/31/2018 8:38 AM Staffing Resident/CRNA: Lavina Hamman, CRNA Other anesthesia staff: Annye Asa, MD Performed: resident/CRNA and other anesthesia staff  Preanesthetic Checklist Completed: patient identified, site marked, surgical consent, pre-op evaluation, timeout performed, IV checked, risks and benefits discussed and monitors and equipment checked Spinal Block Patient position: sitting Prep: ChloraPrep and DuraPrep Patient monitoring: heart rate, cardiac monitor, continuous pulse ox and blood pressure Approach: midline Location: L4-5 Injection technique: single-shot Needle Needle type: Spinocan and Pencan  Needle gauge: 24 G Needle length: 9 cm Needle insertion depth: 8 cm Assessment Sensory level: T6 Additional Notes Lot and expiration date OK.  Pt tolerated well.  -heme, -para, VSS.

## 2018-08-31 NOTE — Evaluation (Signed)
Physical Therapy Evaluation Patient Details Name: Nicole Reyes MRN: [id number] DOB: 16-Feb-1956 Today's Date: 08/31/2018   History of Present Illness  62 YO female s/p R TKR on 9/18. PMH includes anxiety, HTN, asthma.   Clinical Impression  Pt s/p R TKR. Pt with anxiety throughout session. Pt presents with difficulty performing bed mobility/transfers/gait, R knee pain exacerbated by mobility, gait deficits, and decreased tolerance for ambulation at this time. Pt to benefit from acute PT to address deficits. Pt ambulated 15 ft with RW with min assist today, requiring mod-max encouragement to participate in mobility. Will continue to follow acutely and progress mobility as able. Pt wishes to d/c home.     Follow Up Recommendations Follow surgeon's recommendation for DC plan and follow-up therapies;Supervision for mobility/OOB(home vs SNF, unable to determine from notes/orders)    Equipment Recommendations  Rolling walker with 5" wheels    Recommendations for Other Services       Precautions / Restrictions Precautions Precautions: Fall;Knee Required Braces or Orthoses: Knee Immobilizer - Right Knee Immobilizer - Right: On when out of bed or walking;Discontinue once straight leg raise with < 10 degree lag(used R KI due to pt being unable to perform SLR with <10 degrees knee lag, high knee pain with mobility) Restrictions Weight Bearing Restrictions: No Other Position/Activity Restrictions: WBAT       Mobility  Bed Mobility Overal bed mobility: Needs Assistance Bed Mobility: Supine to Sit     Supine to sit: Min assist;HOB elevated     General bed mobility comments: Min assist for LE management, scooting to EOB. Verbal cuing for sequencing. Pt anxious with bed mobility, increased time to perform.   Transfers Overall transfer level: Needs assistance Equipment used: Rolling walker (2 wheeled) Transfers: Sit to/from Stand Sit to Stand: Min assist;From elevated surface         General transfer comment: Min assist for trunk elevation, hip extension. Verbal cuing for hand placement, standing up straight.   Ambulation/Gait Ambulation/Gait assistance: Min assist Gait Distance (Feet): 15 Feet Assistive device: Rolling walker (2 wheeled) Gait Pattern/deviations: Step-to pattern;Decreased stride length;Decreased weight shift to right;Decreased stance time - right;Antalgic;Trunk flexed Gait velocity: very decr    General Gait Details: Min assist for steadying, verbal cuing for sequencing, placement in RW. Max verbal encouragement for mobility, pt with increasing pain with further ambulation so limited to 15 ft only.   Stairs            Wheelchair Mobility    Modified Rankin (Stroke Patients Only)       Balance Overall balance assessment: Mild deficits observed, not formally tested                                           Pertinent Vitals/Pain Pain Assessment: 0-10 Pain Score: 3  Pain Location: R knee  Pain Descriptors / Indicators: Sore;Operative site guarding Pain Intervention(s): Limited activity within patient's tolerance;Repositioned;Ice applied;Monitored during session;Premedicated before session    Home Living Family/patient expects to be discharged to:: Private residence Living Arrangements: Alone Available Help at Discharge: Family;Friend(s);Available PRN/intermittently Type of Home: Apartment Home Access: Stairs to enter Entrance Stairs-Rails: None Entrance Stairs-Number of Steps: 1 Home Layout: One level Home Equipment: None      Prior Function Level of Independence: Independent               Hand Dominance   Dominant Hand: Right  Extremity/Trunk Assessment   Upper Extremity Assessment Upper Extremity Assessment: Overall WFL for tasks assessed    Lower Extremity Assessment Lower Extremity Assessment: Overall WFL for tasks assessed;RLE deficits/detail RLE Deficits / Details: suspected  post-surgical weakness; able to perform R quad set but weak, difficulty with SLR, able to perform ankle pumps  RLE: Unable to fully assess due to pain RLE Sensation: WNL    Cervical / Trunk Assessment Cervical / Trunk Assessment: Normal  Communication   Communication: No difficulties  Cognition Arousal/Alertness: Awake/alert Behavior During Therapy: Anxious;WFL for tasks assessed/performed Overall Cognitive Status: Within Functional Limits for tasks assessed                                        General Comments      Exercises Total Joint Exercises Ankle Circles/Pumps: AROM;Both;10 reps;Supine   Assessment/Plan    PT Assessment Patient needs continued PT services  PT Problem List Decreased strength;Pain;Decreased range of motion;Decreased activity tolerance;Decreased knowledge of use of DME;Decreased balance;Decreased safety awareness;Decreased mobility       PT Treatment Interventions DME instruction;Therapeutic activities;Gait training;Therapeutic exercise;Patient/family education;Stair training;Balance training;Functional mobility training    PT Goals (Current goals can be found in the Care Plan section)  Acute Rehab PT Goals PT Goal Formulation: With patient Time For Goal Achievement: 09/14/18 Potential to Achieve Goals: Good    Frequency 7X/week   Barriers to discharge        Co-evaluation               AM-PAC PT "6 Clicks" Daily Activity  Outcome Measure Difficulty turning over in bed (including adjusting bedclothes, sheets and blankets)?: Unable Difficulty moving from lying on back to sitting on the side of the bed? : Unable Difficulty sitting down on and standing up from a chair with arms (e.g., wheelchair, bedside commode, etc,.)?: Unable Help needed moving to and from a bed to chair (including a wheelchair)?: A Little Help needed walking in hospital room?: A Little Help needed climbing 3-5 steps with a railing? : A Lot 6 Click  Score: 11    End of Session Equipment Utilized During Treatment: Gait belt;Right knee immobilizer Activity Tolerance: Patient limited by pain;Patient limited by fatigue Patient left: in chair;with chair alarm set;with call bell/phone within reach;with family/visitor present;with nursing/sitter in room Nurse Communication: Mobility status PT Visit Diagnosis: Unsteadiness on feet (R26.81);Difficulty in walking, not elsewhere classified (R26.2)    Time: 0865-7846 PT Time Calculation (min) (ACUTE ONLY): 27 min   Charges:   PT Evaluation $PT Eval Low Complexity: 1 Low PT Treatments $Gait Training: 8-22 mins       Julien Girt, PT Acute Rehabilitation Services Pager [phone]  Office [phone]  Roxine Caddy D Elonda Husky 08/31/2018, 7:37 PM

## 2018-08-31 NOTE — Anesthesia Procedure Notes (Signed)
Anesthesia Regional Block: Adductor canal block   Pre-Anesthetic Checklist: ,, timeout performed, Correct Patient, Correct Site, Correct Laterality, Correct Procedure, Correct Position, site marked, Risks and benefits discussed,  Surgical consent,  Pre-op evaluation,  At surgeon's request and post-op pain management  Laterality: Right and Lower  Prep: chloraprep       Needles:  Injection technique: Single-shot  Needle Type: Echogenic Needle     Needle Length: 9cm  Needle Gauge: 21     Additional Needles:   Procedures:,,,, ultrasound used (permanent image in chart),,,,  Narrative:  Start time: 08/31/2018 8:06 AM End time: 08/31/2018 8:12 AM Injection made incrementally with aspirations every 5 mL.  Performed by: Personally  Anesthesiologist: Annye Asa, MD  Additional Notes: Pt identified in Holding room.  Monitors applied. Working IV access confirmed. Sterile prep R thigh.  #21ga ECHOgenic needle into adductor canal with US guidance.  20cc 0.75% Ropivacaine injected incrementally after negative test dose.  Patient asymptomatic, VSS, no heme aspirated, tolerated well.  Jenita Seashore, MD

## 2018-09-01 ENCOUNTER — Encounter (HOSPITAL_COMMUNITY): Payer: Self-pay | Admitting: Specialist

## 2018-09-01 LAB — BASIC METABOLIC PANEL
ANION GAP: 8 (ref 5–15)
BUN: 13 mg/dL (ref 8–23)
CHLORIDE: 104 mmol/L (ref 98–111)
CO2: 27 mmol/L (ref 22–32)
Calcium: 9 mg/dL (ref 8.9–10.3)
Creatinine, Ser: 0.77 mg/dL (ref 0.44–1.00)
GFR calc Af Amer: >60 mL/min (ref >60)
GFR calc non Af Amer: >60 mL/min (ref >60)
Glucose, Bld: 133 mg/dL — ABNORMAL HIGH (ref 70–99)
POTASSIUM: 4.4 mmol/L (ref 3.5–5.1)
SODIUM: 139 mmol/L (ref 135–145)

## 2018-09-01 LAB — CBC
HCT: 38.3 % (ref 36.0–46.0)
HEMOGLOBIN: 12.6 g/dL (ref 12.0–15.0)
MCH: 26.9 pg (ref 26.0–34.0)
MCHC: 32.9 g/dL (ref 30.0–36.0)
MCV: 81.7 fL (ref 78.0–100.0)
Platelets: 322 10*3/uL (ref 150–400)
RBC: 4.69 MIL/uL (ref 3.87–5.11)
RDW: 13.1 % (ref 11.5–15.5)
WBC: 8.5 10*3/uL (ref 4.0–10.5)

## 2018-09-01 MED ORDER — PROMETHAZINE HCL 25 MG PO TABS: 12.5000 mg | ORAL_TABLET | Freq: Four times a day (QID) | ORAL | Status: DC | PRN

## 2018-09-01 MED ORDER — PROMETHAZINE HCL 25 MG/ML IJ SOLN: 12.5000 mg | Freq: Four times a day (QID) | INTRAMUSCULAR | Status: DC | PRN

## 2018-09-01 NOTE — Progress Notes (Signed)
CSW consult- SNF  Plan: Home w/ [place], Marlinda Mike, MSW Clinical Social Worker  [phone] 09/01/2018  11:58 AM

## 2018-09-01 NOTE — Care Management Note (Signed)
Case Management Note  Patient Details  Name: Nicole Reyes MRN: [id number] Date of Birth: 1956/07/14  Subjective/Objective:                  Discharge planning, spoke with patient at bedside. Have chosen [facility] for [facility] PT, evaluate and treat.   Action/Plan: Contacted [facility] for referral. Needs RW, contacted [facility] to deliver to room. [phone]  Expected Discharge Date:                  Expected Discharge Plan:  [place]  In-House Referral:  NA  Discharge planning Services  CM Consult  Post Acute Care Choice:  Durable Medical Equipment, Home Health Choice offered to:  Patient  DME Arranged:  Walker rolling DME Agency:  [place]:  PT [facility] Agency:  [facility]  Status of Service:  Completed, signed off  If discussed at [place] of Stay Meetings, dates discussed:    Additional Comments:  Guadalupe Maple, RN 09/01/2018, 11:39 AM

## 2018-09-01 NOTE — Progress Notes (Signed)
Physical Therapy Treatment Patient Details Name: Nicole Reyes MRN: [id number] DOB: 06-27-1956 Today's Date: 09/01/2018    History of Present Illness 62 YO female s/p R TKR on 9/18. PMH includes anxiety, HTN, asthma.     PT Comments    POD # 1 am session Applied KI and instructed on use.  Assisted OOB to amb a limited due to fatigue.  Returned to room to perform some TKR TE's following HEP handout.  Instructed on proper tech, freq as well as use of ICE. Pt moving slowly and required increased time.    Follow Up Recommendations  Follow surgeon's recommendation for DC plan and follow-up therapies;Supervision for mobility/OOB     Equipment Recommendations  Rolling walker with 5" wheels    Recommendations for Other Services       Precautions / Restrictions Precautions Precautions: Fall;Knee Precaution Comments: instructed on KI and proper application Required Braces or Orthoses: Knee Immobilizer - Right Knee Immobilizer - Right: On when out of bed or walking;Discontinue once straight leg raise with < 10 degree lag Restrictions Weight Bearing Restrictions: No Other Position/Activity Restrictions: WBAT     Mobility  Bed Mobility Overal bed mobility: Needs Assistance Bed Mobility: Supine to Sit     Supine to sit: Min assist;HOB elevated     General bed mobility comments: Min assist for LE management, scooting to EOB. Verbal cuing for sequencing.  Transfers Overall transfer level: Needs assistance Equipment used: Rolling walker (2 wheeled) Transfers: Sit to/from Stand Sit to Stand: Min assist;From elevated surface         General transfer comment: Min assist for trunk elevation, hip extension. Verbal cuing for hand placemen and safety with turns  Ambulation/Gait Ambulation/Gait assistance: Min guard;Min assist Gait Distance (Feet): 25 Feet Assistive device: Rolling walker (2 wheeled) Gait Pattern/deviations: Step-to pattern;Decreased stride length;Decreased  weight shift to right;Decreased stance time - right;Antalgic;Trunk flexed Gait velocity: decreased   General Gait Details: 25% VC's on proper walker to self distance and safety with turns   Marine scientist Rankin (Stroke Patients Only)       Balance                                            Cognition Arousal/Alertness: Awake/alert Behavior During Therapy: Anxious;WFL for tasks assessed/performed Overall Cognitive Status: Within Functional Limits for tasks assessed                                        Exercises   Total Knee Replacement TE's 10 reps B LE ankle pumps 10 reps towel squeezes 10 reps knee presses 10 reps heel slides  10 reps SAQ's 10 reps SLR's 10 reps ABD Followed by ICE     General Comments        Pertinent Vitals/Pain Pain Assessment: 0-10 Pain Score: 5  Pain Location: R knee  Pain Descriptors / Indicators: Sore;Operative site guarding Pain Intervention(s): Monitored during session;Repositioned;Ice applied;Premedicated before session    Home Living                      Prior Function            PT Goals (current goals can  now be found in the care plan section) Progress towards PT goals: Progressing toward goals    Frequency    7X/week      PT Plan Current plan remains appropriate    Co-evaluation              AM-PAC PT "6 Clicks" Daily Activity  Outcome Measure  Difficulty turning over in bed (including adjusting bedclothes, sheets and blankets)?: A Lot Difficulty moving from lying on back to sitting on the side of the bed? : A Lot Difficulty sitting down on and standing up from a chair with arms (e.g., wheelchair, bedside commode, etc,.)?: A Lot Help needed moving to and from a bed to chair (including a wheelchair)?: A Lot Help needed walking in hospital room?: A Lot Help needed climbing 3-5 steps with a railing? : A Lot 6 Click  Score: 12    End of Session Equipment Utilized During Treatment: Gait belt;Right knee immobilizer Activity Tolerance: Patient limited by pain;Patient limited by fatigue Patient left: in chair;with chair alarm set;with call bell/phone within reach;with family/visitor present;with nursing/sitter in room Nurse Communication: Mobility status PT Visit Diagnosis: Unsteadiness on feet (R26.81);Difficulty in walking, not elsewhere classified (R26.2)     Time: 0930-1010 PT Time Calculation (min) (ACUTE ONLY): 40 min  Charges:  $Gait Training: 8-22 mins $Therapeutic Exercise: 8-22 mins $Therapeutic Activity: 8-22 mins                     Rica Koyanagi  PTA Acute  Rehabilitation Services Pager      [phone] Office      [phone]

## 2018-09-01 NOTE — Progress Notes (Signed)
Subjective: 1 Day Post-Op Procedure(s) (LRB): RIGHT  TOTAL KNEE ARTHROPLASTY (Right) Patient reports pain as moderate.  C/o knee pain. No other c/o. Denies numbness or tingling. No longer vomiting, may have eaten too fast. Nausea improved. No other c/o.  Objective: Vital signs in last 24 hours: Temp:  [97.4 F (36.3 C)-98.4 F (36.9 C)] 97.8 F (36.6 C) (09/19 0839) Pulse Rate:  [70-95] 79 (09/19 1044) Resp:  [14-16] 14 (09/19 0839) BP: (124-174)/(62-86) 174/84 (09/19 1044) SpO2:  [95 %-100 %] 98 % (09/19 1044)  Intake/Output from previous day: 09/18 0701 - 09/19 0700 In: 2929.4 [P.O.:360; I.V.:2469.4; IV Piggyback:100] Out: 2250 [Urine:2200; Blood:50] Intake/Output this shift: Total I/O In: 120 [P.O.:120] Out: -   Recent Labs    09/01/18 0439  HGB 12.6   Recent Labs    09/01/18 0439  WBC 8.5  RBC 4.69  HCT 38.3  PLT 322   Recent Labs    09/01/18 0439  NA 139  K 4.4  CL 104  CO2 27  BUN 13  CREATININE 0.77  GLUCOSE 133*  CALCIUM 9.0   No results for input(s): LABPT, INR in the last 72 hours.  Neurologically intact ABD soft Neurovascular intact Sensation intact distally Intact pulses distally Dorsiflexion/Plantar flexion intact Incision: dressing C/D/I and no drainage No cellulitis present Compartment soft no calf pain or sign of DVT  Anticipated LOS equal to or greater than 2 midnights due to - Age 80 and older with one or more of the following:  - Obesity  - Expected need for hospital services (PT, OT, Nursing) required for safe  discharge  - Anticipated need for postoperative skilled nursing care or inpatient rehab  - Active co-morbidities: None   Assessment/Plan: 1 Day Post-Op Procedure(s) (LRB): RIGHT  TOTAL KNEE ARTHROPLASTY (Right) Advance diet Up with therapy D/C IV fluids Continue antiemetics prn, nausea improved Will discuss with Dr. Tonita Cong Plan for D/C with HHPT tomorrow vs. Saturday depending on progress   Nicole Reyes  M. 09/01/2018, 11:51 AM

## 2018-09-01 NOTE — Progress Notes (Signed)
Physical Therapy Treatment Patient Details Name: Nicole Reyes MRN: [id number] DOB: July 26, 1956 Today's Date: 09/01/2018    History of Present Illness 62 YO female s/p R TKR on 9/18. PMH includes anxiety, HTN, asthma.     PT Comments    POD # 1 pm session Assisted pt OOb to amb to bathroom to void.  Assisted with peri care and balance.  Amb out of bathroom when pt stated she was dizzy and "felt not right".  Assisted to EOB and took vitals.  BP 187/85, HR 98, RA 100%  Reported high BP to RUN and assisted pt back to bed.  "I can feel my blood pressure high".   Follow Up Recommendations  Follow surgeon's recommendation for DC plan and follow-up therapies;Supervision for mobility/OOB     Equipment Recommendations  Rolling walker with 5" wheels    Recommendations for Other Services       Precautions / Restrictions Precautions Precautions: Fall;Knee Precaution Comments: instructed on KI and proper application Required Braces or Orthoses: Knee Immobilizer - Right Knee Immobilizer - Right: On when out of bed or walking;Discontinue once straight leg raise with < 10 degree lag Restrictions Weight Bearing Restrictions: No Other Position/Activity Restrictions: WBAT     Mobility  Bed Mobility Overal bed mobility: Needs Assistance Bed Mobility: Supine to Sit;Sit to Supine     Supine to sit: Min assist Sit to supine: Min assist   General bed mobility comments: Min assist for LE management, scooting to EOB. Verbal cuing for sequencing.  also assisted abck to bed supporting R LE  Transfers Overall transfer level: Needs assistance Equipment used: Rolling walker (2 wheeled) Transfers: Sit to/from Stand Sit to Stand: Min assist;From elevated surface         General transfer comment: Min assist for trunk elevation, hip extension. Verbal cuing for hand placemen and safety with turns   assisted with toilet transfer  Ambulation/Gait Ambulation/Gait assistance: Min guard;Min  assist Gait Distance (Feet): 22 Feet(11 feet x 2 to and from bathroom only) Assistive device: Rolling walker (2 wheeled) Gait Pattern/deviations: Step-to pattern;Decreased stride length;Decreased weight shift to right;Decreased stance time - right;Antalgic;Trunk flexed Gait velocity: decreased   General Gait Details: 25% VC's on proper walker to self distance and safety with turns   increased c/o dizziness at end of walk  BP 187/85 reported to Insurance underwriter    Modified Rankin (Stroke Patients Only)       Balance                                            Cognition Arousal/Alertness: Awake/alert Behavior During Therapy: Anxious;WFL for tasks assessed/performed Overall Cognitive Status: Within Functional Limits for tasks assessed                                        Exercises      General Comments        Pertinent Vitals/Pain Pain Assessment: 0-10 Pain Score: 5  Pain Location: R knee  Pain Descriptors / Indicators: Sore;Operative site guarding Pain Intervention(s): Monitored during session;Repositioned;Ice applied;Premedicated before session    Home Living  Prior Function            PT Goals (current goals can now be found in the care plan section) Progress towards PT goals: Progressing toward goals    Frequency    7X/week      PT Plan Current plan remains appropriate    Co-evaluation              AM-PAC PT "6 Clicks" Daily Activity  Outcome Measure  Difficulty turning over in bed (including adjusting bedclothes, sheets and blankets)?: A Lot Difficulty moving from lying on back to sitting on the side of the bed? : A Lot Difficulty sitting down on and standing up from a chair with arms (e.g., wheelchair, bedside commode, etc,.)?: A Lot Help needed moving to and from a bed to chair (including a wheelchair)?: A Lot Help needed walking in hospital  room?: A Lot Help needed climbing 3-5 steps with a railing? : A Lot 6 Click Score: 12    End of Session Equipment Utilized During Treatment: Gait belt Activity Tolerance: Other (comment)(hypertension) Patient left: in bed;with bed alarm set Nurse Communication: Mobility status PT Visit Diagnosis: Unsteadiness on feet (R26.81);Difficulty in walking, not elsewhere classified (R26.2)     Time: 2248-2500 PT Time Calculation (min) (ACUTE ONLY): 28 min  Charges:  $Gait Training: 8-22 mins $Therapeutic Activity: 8-22 mins                     Rica Koyanagi  PTA Acute  Rehabilitation Services Pager      [phone] Office      [phone]

## 2018-09-02 LAB — CBC
HCT: 39.2 % (ref 36.0–46.0)
Hemoglobin: 12.7 g/dL (ref 12.0–15.0)
MCH: 26.6 pg (ref 26.0–34.0)
MCHC: 32.4 g/dL (ref 30.0–36.0)
MCV: 82.2 fL (ref 78.0–100.0)
Platelets: 321 10*3/uL (ref 150–400)
RBC: 4.77 MIL/uL (ref 3.87–5.11)
RDW: 12.9 % (ref 11.5–15.5)
WBC: 7.4 10*3/uL (ref 4.0–10.5)

## 2018-09-02 NOTE — Progress Notes (Signed)
Subjective: 2 Days Post-Op Procedure(s) (LRB): RIGHT  TOTAL KNEE ARTHROPLASTY (Right) Patient reports pain as moderate.    Objective: Vital signs in last 24 hours: Temp:  [98.1 F (36.7 C)-98.3 F (36.8 C)] 98.2 F (36.8 C) (09/20 0552) Pulse Rate:  [79-98] 94 (09/20 0552) Resp:  [14-19] 19 (09/19 2226) BP: (163-187)/(81-89) 171/88 (09/20 0552) SpO2:  [92 %-100 %] 96 % (09/20 0948)  Intake/Output from previous day: 09/19 0701 - 09/20 0700 In: 1437.8 [P.O.:1040; I.V.:397.8] Out: 800 [Urine:800] Intake/Output this shift: Total I/O In: 360 [P.O.:360] Out: -   Recent Labs    09/01/18 0439 09/02/18 0508  HGB 12.6 12.7   Recent Labs    09/01/18 0439 09/02/18 0508  WBC 8.5 7.4  RBC 4.69 4.77  HCT 38.3 39.2  PLT 322 321   Recent Labs    09/01/18 0439  NA 139  K 4.4  CL 104  CO2 27  BUN 13  CREATININE 0.77  GLUCOSE 133*  CALCIUM 9.0   No results for input(s): LABPT, INR in the last 72 hours.  Neurologically intact ABD soft Neurovascular intact Sensation intact distally Intact pulses distally Dorsiflexion/Plantar flexion intact Incision: dressing C/D/I and no drainage No cellulitis present Compartment soft no calf pain or sign of DVT  Moderate swelling  Anticipated LOS equal to or greater than 2 midnights due to - Age 34 and older with one or more of the following:  - Obesity  - Expected need for hospital services (PT, OT, Nursing) required for safe  discharge  - Anticipated need for postoperative skilled nursing care or inpatient rehab  - Active co-morbidities: None OR   - Unanticipated findings during/Post Surgery: Slow post-op progression: GI, pain control, mobility  - Patient is a high risk of re-admission due to: None   Assessment/Plan: 2 Days Post-Op Procedure(s) (LRB): RIGHT  TOTAL KNEE ARTHROPLASTY (Right) Advance diet Up with therapy D/C IV fluids  Continue PT Plan D/C home with HHPT tomorrow Discussed with Dr. Mliss Fritz,  Conley Rolls. 09/02/2018, 1:04 PM

## 2018-09-02 NOTE — Progress Notes (Signed)
Physical Therapy Treatment Patient Details Name: Nicole Reyes MRN: [id number] DOB: 06/23/1956 Today's Date: 09/02/2018    History of Present Illness 62 YO female s/p R TKR on 9/18. PMH includes anxiety, HTN, asthma.     PT Comments    POD # 2 Assisted OOB to amb to bathroom then in hallway.  Pt tolerated an increased distance with no c/o's.  Returned to room then performed all supine TKR TE's followed by ICE.  Pt plans to D/C to home tomorrow.   Follow Up Recommendations  Follow surgeon's recommendation for DC plan and follow-up therapies;Supervision for mobility/OOB     Equipment Recommendations  Rolling walker with 5" wheels    Recommendations for Other Services       Precautions / Restrictions Precautions Precautions: Fall;Knee Precaution Comments: did not use KI this session and did well Restrictions Weight Bearing Restrictions: No Other Position/Activity Restrictions: WBAT     Mobility  Bed Mobility Overal bed mobility: Needs Assistance Bed Mobility: Supine to Sit     Supine to sit: Min assist     General bed mobility comments: assist R LE off bed and increased time  Transfers Overall transfer level: Needs assistance Equipment used: Rolling walker (2 wheeled) Transfers: Sit to/from [employer] Sit to Stand: Supervision;Min guard Stand pivot transfers: Supervision;Min guard       General transfer comment: increased time and one VC safety with turns   also assisted with toilet transfer with VC's to extend R LE prior to sit  Ambulation/Gait Ambulation/Gait assistance: Supervision;Min guard Gait Distance (Feet): 55 Feet Assistive device: Rolling walker (2 wheeled) Gait Pattern/deviations: Step-to pattern;Decreased stride length;Decreased weight shift to right;Decreased stance time - right Gait velocity: decreased   General Gait Details: one VC on safety with turns   Tolerated an increased distance    No c/o dizziness    Stairs              Wheelchair Mobility    Modified Rankin (Stroke Patients Only)       Balance                                            Cognition Arousal/Alertness: Awake/alert Behavior During Therapy: WFL for tasks assessed/performed Overall Cognitive Status: Within Functional Limits for tasks assessed                                 General Comments: feeling better      Exercises      General Comments        Pertinent Vitals/Pain Pain Assessment: 0-10 Pain Score: 4  Pain Location: R knee  Pain Descriptors / Indicators: Sore;Operative site guarding Pain Intervention(s): Monitored during session;Patient requesting pain meds-RN notified;Repositioned;Ice applied    Home Living                      Prior Function            PT Goals (current goals can now be found in the care plan section) Progress towards PT goals: Progressing toward goals    Frequency    7X/week      PT Plan Current plan remains appropriate    Co-evaluation              AM-PAC PT "6 Clicks" Daily Activity  Outcome Measure  Difficulty turning over in bed (including adjusting bedclothes, sheets and blankets)?: A Little Difficulty moving from lying on back to sitting on the side of the bed? : A Little Difficulty sitting down on and standing up from a chair with arms (e.g., wheelchair, bedside commode, etc,.)?: A Little Help needed moving to and from a bed to chair (including a wheelchair)?: A Little Help needed walking in hospital room?: A Little Help needed climbing 3-5 steps with a railing? : A Lot 6 Click Score: 17    End of Session Equipment Utilized During Treatment: Gait belt Activity Tolerance: Patient tolerated treatment well Patient left: in chair;with call bell/phone within reach;with chair alarm set Nurse Communication: Mobility status PT Visit Diagnosis: Unsteadiness on feet (R26.81);Difficulty in walking, not elsewhere  classified (R26.2)     Time: 1350-1415 PT Time Calculation (min) (ACUTE ONLY): 25 min  Charges:  $Gait Training: 8-22 mins $Therapeutic Activity: 8-22 mins                     Rica Koyanagi  PTA Acute  Rehabilitation Services Pager      [phone] Office      [phone]

## 2018-09-03 LAB — CBC
HEMATOCRIT: 38.5 % (ref 36.0–46.0)
Hemoglobin: 12.4 g/dL (ref 12.0–15.0)
MCH: 26.4 pg (ref 26.0–34.0)
MCHC: 32.2 g/dL (ref 30.0–36.0)
MCV: 82.1 fL (ref 78.0–100.0)
PLATELETS: 286 10*3/uL (ref 150–400)
RBC: 4.69 MIL/uL (ref 3.87–5.11)
RDW: 13 % (ref 11.5–15.5)
WBC: 6.9 10*3/uL (ref 4.0–10.5)

## 2018-09-03 NOTE — Discharge Summary (Signed)
Orthopedic Discharge Summary        Physician Discharge Summary  Patient ID: Nicole Reyes MRN: [id number] DOB/AGE: 07/04/1956 62 y.o.  Admit date: 08/31/2018 Discharge date: 09/03/2018   Procedures:  Procedure(s) (LRB): RIGHT  TOTAL KNEE ARTHROPLASTY (Right)  Attending Physician:  Dr. Tonita Cong  Admission Diagnoses:   Right knee end stage osteoarthritis  Discharge Diagnoses:  Right knee end stage osteoarthritis   Past Medical History:  Diagnosis Date  . Allergy   . Anxiety   . Arthritis   . Asthma   . Hypertension     PCP: Dorise Hiss, PA-C   Discharged Condition: good  Hospital Course:  Patient underwent the above stated procedure on 08/31/2018. Patient tolerated the procedure well and brought to the recovery room in good condition and subsequently to the floor. Patient had an uncomplicated hospital course and was stable for discharge.   Disposition: Discharge disposition: 01-Home or Self Care      with follow up in 2 weeks   Follow-up Information    Susa Day, MD Follow up in 2 week(s).   Specialty:  Orthopedic Surgery Contact information: [address] [place] [address] [phone]        [facility], [facility] Follow up.   Specialty:  Home Health Services Why:  Physical therapy Contact information: [address] [place] [address] [phone]           Discharge Instructions    Call MD / Call 911   Complete by:  As directed    If you experience chest pain or shortness of breath, CALL 911 and be transported to the hospital emergency room.  If you develope a fever above 101 F, pus (white drainage) or increased drainage or redness at the wound, or calf pain, call your surgeon's office.   Constipation Prevention   Complete by:  As directed    Drink plenty of fluids.  Prune juice may be helpful.  You may use a stool softener, such as Colace (over the counter) 100 mg twice a day.  Use  MiraLax (over the counter) for constipation as needed.   Diet - low sodium heart healthy   Complete by:  As directed    Increase activity slowly as tolerated   Complete by:  As directed       Allergies as of 09/03/2018      Reactions   Shellfish Allergy Anaphylaxis   Peanut-containing Drug Products    Also walnuts and almonds       Medication List    STOP taking these medications   aspirin 81 MG tablet Replaced by:  aspirin EC 325 MG tablet   Nystatin Powd     TAKE these medications   albuterol 108 (90 Base) MCG/ACT inhaler Commonly known as:  PROVENTIL HFA;VENTOLIN HFA INHALE 2 PUFFS INTO THE LUNGS EVERY 6 (SIX) HOURS AS NEEDED FOR WHEEZING.   amLODipine 10 MG tablet Commonly known as:  NORVASC TAKE 1 TABLET BY MOUTH EVERY DAY   aspirin EC 325 MG tablet Take 1 tablet (325 mg total) by mouth daily. Replaces:  aspirin 81 MG tablet   budesonide-formoterol 80-4.5 MCG/ACT inhaler Commonly known as:  SYMBICORT Inhale 2 puffs into the lungs 2 (two) times daily.   cetirizine 10 MG tablet Commonly known as:  ZYRTEC Take 1 tablet (10 mg total) by mouth daily.   clotrimazole-betamethasone cream Commonly known as:  LOTRISONE Apply 1 application topically 2 (two) times daily. What changed:  when to take this  reasons to take this   docusate sodium 100 MG capsule Commonly known as:  COLACE Take 1 capsule (100 mg total) by mouth 2 (two) times daily.   fluticasone 50 MCG/ACT nasal spray Commonly known as:  FLONASE Place 1 spray into both nostrils 2 (two) times daily as needed for allergies or rhinitis.   levocetirizine 5 MG tablet Commonly known as:  XYZAL Take 1 tablet (5 mg total) by mouth every evening.   methocarbamol 500 MG tablet Commonly known as:  ROBAXIN Take 1 tablet (500 mg total) by mouth every 6 (six) hours as needed for muscle spasms.   montelukast 10 MG tablet Commonly known as:  SINGULAIR Take 1 tablet (10 mg total) by mouth at bedtime.     MULTIVITAMIN ADULT Chew Chew 2 each by mouth daily.   Olopatadine HCl 0.2 % Soln Place 1 drop into both eyes daily as needed. What changed:  reasons to take this   oxyCODONE 5 MG immediate release tablet Commonly known as:  Oxy IR/ROXICODONE Take 1-2 tablets (5-10 mg total) by mouth every 4 (four) hours as needed.   polyethylene glycol packet Commonly known as:  MIRALAX / GLYCOLAX Take 17 g by mouth daily.            Durable Medical Equipment  (From admission, onward)         Start     Ordered   09/01/18 1137  For home use only DME Walker rolling  Once    Question:  Patient needs a walker to treat with the following condition  Answer:  History of orthopedic surgery   09/01/18 1137            Signed: Ventura Bruns 09/03/2018, 9:33 AM  [place] is now [address] [place], [place], [place] [address] Phone: [phone] Facebook  Instagram  [employer]

## 2018-09-03 NOTE — Progress Notes (Signed)
DC instructions and prescriptions provided and explained in detail. Patient verbalized understanding. Escorted to lobby in wheel chair to be dc home with family

## 2018-09-03 NOTE — Care Management (Signed)
Faxed dc summary to [facility]. Please see previous NCM notes. Jonnie Finner RN CCM Case Mgmt phone [phone]

## 2018-09-03 NOTE — Progress Notes (Signed)
Contacted [facility] for 3n1 bedside commode for home to be delivered to room prior to dc. Jonnie Finner RN CCM Case Mgmt phone [phone]

## 2018-09-03 NOTE — Progress Notes (Signed)
   Subjective: 3 Days Post-Op Procedure(s) (LRB): RIGHT  TOTAL KNEE ARTHROPLASTY (Right)  Pt doing well this morning Ready for d/c home today Denies any new symptoms or issues C/o mild pain in the knee Patient reports pain as mild.  Objective:   VITALS:   Vitals:   09/03/18 0516 09/03/18 0916  BP: (!) 148/73   Pulse: 98   Resp: 16   Temp: 98.3 F (36.8 C)   SpO2: 98% 94%    Right knee: well healed incision  nv intact distally No rashes or edema Good rom   LABS Recent Labs    09/01/18 0439 09/02/18 0508 09/03/18 0409  HGB 12.6 12.7 12.4  HCT 38.3 39.2 38.5  WBC 8.5 7.4 6.9  PLT 322 321 286    Recent Labs    09/01/18 0439  NA 139  K 4.4  BUN 13  CREATININE 0.77  GLUCOSE 133*     Assessment/Plan: 3 Days Post-Op Procedure(s) (LRB): RIGHT  TOTAL KNEE ARTHROPLASTY (Right) D/c home today F/u in 2 weeks  Pt doing well Pulmonary toilet    [facility] [place], [place] is now [employer] [facility] [address] [place], [place], [place] [address] Phone: [phone] [email or web address] Facebook  [employer]

## 2018-09-03 NOTE — Progress Notes (Signed)
Physical Therapy Treatment Patient Details Name: Nicole Reyes MRN: [id number] DOB: [birth year] Today's Date: 09/03/2018    History of Present Illness 62 YO female s/p R TKR on 9/18. PMH includes anxiety, HTN, asthma.     PT Comments    POD # 3 Assisted with amb and practiced one step pt has to enter home.   Follow Up Recommendations  Follow surgeon's recommendation for DC plan and follow-up therapies;Supervision for mobility/OOB     Equipment Recommendations  Rolling walker with 5" wheels    Recommendations for Other Services       Precautions / Restrictions Precautions Precautions: Fall;Knee Precaution Comments: did not use KI this session and did well Restrictions Weight Bearing Restrictions: No Other Position/Activity Restrictions: WBAT     Mobility  Bed Mobility               General bed mobility comments: OOB in recliner  Transfers Overall transfer level: Needs assistance Equipment used: Rolling walker (2 wheeled) Transfers: Sit to/from [employer] Transfers Sit to Stand: Supervision Stand pivot transfers: Supervision       General transfer comment: good safety tech  Ambulation/Gait Ambulation/Gait assistance: Supervision Gait Distance (Feet): 125 Feet Assistive device: Rolling walker (2 wheeled) Gait Pattern/deviations: Step-to pattern;Decreased stride length;Decreased weight shift to right;Decreased stance time - right Gait velocity: decreased   General Gait Details: tolerated well   Stairs Stairs: Yes Stairs assistance: Min assist Stair Management: No rails;Step to pattern;Backwards;With walker Number of Stairs: 1 General stair comments: up backward as pt was unable forward approach   Wheelchair Mobility    Modified Rankin (Stroke Patients Only)       Balance                                            Cognition Arousal/Alertness: Awake/alert Behavior During Therapy: WFL for tasks  assessed/performed Overall Cognitive Status: Within Functional Limits for tasks assessed                                        Exercises      General Comments        Pertinent Vitals/Pain Pain Assessment: 0-10 Pain Score: 5  Pain Location: R knee  Pain Descriptors / Indicators: Sore;Operative site guarding Pain Intervention(s): Monitored during session;Repositioned;Premedicated before session    Home Living                      Prior Function            PT Goals (current goals can now be found in the care plan section)      Frequency    7X/week      PT Plan Current plan remains appropriate    Co-evaluation              AM-PAC PT "6 Clicks" Daily Activity  Outcome Measure  Difficulty turning over in bed (including adjusting bedclothes, sheets and blankets)?: A Little Difficulty moving from lying on back to sitting on the side of the bed? : A Little Difficulty sitting down on and standing up from a chair with arms (e.g., wheelchair, bedside commode, etc,.)?: A Little Help needed moving to and from a bed to chair (including a wheelchair)?: A Little Help needed walking in hospital room?:  A Little Help needed climbing 3-5 steps with a railing? : A Little 6 Click Score: 18    End of Session Equipment Utilized During Treatment: Gait belt Activity Tolerance: Patient tolerated treatment well Patient left: in chair;with call bell/phone within reach;with chair alarm set Nurse Communication: (pt ready for D/C to home) PT Visit Diagnosis: Unsteadiness on feet (R26.81);Difficulty in walking, not elsewhere classified (R26.2)                            {Kinston Magnan  PTA Acute  Rehabilitation Services Pager      [phone] Office      [phone]

## 2018-10-24 ENCOUNTER — Other Ambulatory Visit: Payer: Self-pay | Admitting: Physician Assistant

## 2018-10-24 DIAGNOSIS — J45909 Unspecified asthma, uncomplicated: Secondary | ICD-10-CM

## 2018-10-24 NOTE — Telephone Encounter (Signed)
Copied from [place] [id number]. Topic: Quick Communication - Rx Refill/Question >> Oct 24, 2018 11:46 AM Selinda Flavin B, NT wrote: Medication: budesonide-formoterol (SYMBICORT) 80-4.5 MCG/ACT inhaler  Has the patient contacted their pharmacy? Yes.   (Agent: If no, request that the patient contact the pharmacy for the refill.) (Agent: If yes, when and what did the pharmacy advise?)  Preferred Pharmacy (with phone number or street name): [id number] [place], [place]  Agent: Please be advised that RX refills may take up to 3 business days. We ask that you follow-up with your pharmacy.

## 2018-10-24 NOTE — Telephone Encounter (Signed)
Requested medication (s) are due for refill today -yes  Requested medication (s) are on the active medication list -yes  Future visit scheduled -no  Last refill: 10/02/17  Notes to clinic: Rx meets protocol to fill- was written by provider no longer in practice. Will need to be written by another provider.  Requested Prescriptions  Pending Prescriptions Disp Refills   budesonide-formoterol (SYMBICORT) 80-4.5 MCG/ACT inhaler 10.2 Inhaler 11    Sig: Inhale 2 puffs into the lungs 2 (two) times daily.     Pulmonology:  Combination Products Passed - 10/24/2018 11:54 AM      Passed - Valid encounter within last 12 months    Recent Outpatient Visits          4 months ago Annual physical exam   Primary Care at [facility], Gelene Mink, PA-C   4 months ago Essential hypertension   Primary Care at [facility], Gelene Mink, PA-C   7 months ago History of allergic reaction   Primary Care at [facility], [place], PA-C   7 months ago Urticaria   Primary Care at [place], [place], [place]   9 months ago Acute sinusitis, recurrence not specified, unspecified location   Primary Care at [facility], [place], [place]              Requested Prescriptions  Pending Prescriptions Disp Refills   budesonide-formoterol (SYMBICORT) 80-4.5 MCG/ACT inhaler 10.2 Inhaler 11    Sig: Inhale 2 puffs into the lungs 2 (two) times daily.     Pulmonology:  Combination Products Passed - 10/24/2018 11:54 AM      Passed - Valid encounter within last 12 months    Recent Outpatient Visits          4 months ago Annual physical exam   Primary Care at [facility], Gelene Mink, PA-C   4 months ago Essential hypertension   Primary Care at [facility], Gelene Mink, PA-C   7 months ago History of allergic reaction   Primary Care at [facility], Gelene Mink, PA-C   7 months ago Urticaria   Primary Care at [place], [place], [place]   9 months ago Acute  sinusitis, recurrence not specified, unspecified location   Primary Care at [facility], [place], [place]

## 2018-11-02 MED ORDER — BUDESONIDE-FORMOTEROL FUMARATE 80-4.5 MCG/ACT IN AERO: 2.0000 | INHALATION_SPRAY | Freq: Two times a day (BID) | RESPIRATORY_TRACT | 8 refills | Status: DC

## 2018-11-02 NOTE — Telephone Encounter (Signed)
Patient was seen 4 months ago by new provider- asthma is active problem on list. Medication filled per protocol. Requested Prescriptions  Pending Prescriptions Disp Refills  . budesonide-formoterol (SYMBICORT) 80-4.5 MCG/ACT inhaler 10.2 Inhaler 8    Sig: Inhale 2 puffs into the lungs 2 (two) times daily.     Pulmonology:  Combination Products Passed - 11/02/2018 10:09 AM      Passed - Valid encounter within last 12 months    Recent Outpatient Visits          4 months ago Annual physical exam   Primary Care at [facility], Gelene Mink, PA-C   5 months ago Essential hypertension   Primary Care at [facility], Gelene Mink, PA-C   7 months ago History of allergic reaction   Primary Care at [facility], Gelene Mink, PA-C   7 months ago Urticaria   Primary Care at [place], [place], [place]   9 months ago Acute sinusitis, recurrence not specified, unspecified location   Primary Care at [facility], [place], [place]

## 2018-11-02 NOTE — Telephone Encounter (Signed)
Pt called to check on status of refill for budesonide-formoterol (SYMBICORT) 80-4.5 MCG/ACT inhaler. Would like to know if/when it will be refilled. Original request was sent on 10/24/18. Please advise Pt CB# [phone]   [id number] [place], [place] [phone] (Phone) [phone] (Fax)

## 2019-01-07 ENCOUNTER — Ambulatory Visit: Payer: 59 | Admitting: Family Medicine

## 2019-01-07 ENCOUNTER — Encounter: Payer: Self-pay | Admitting: Family Medicine

## 2019-01-07 VITALS — BP 154/93 | HR 83 | Temp 98.1°F | Resp 16 | Ht 65.5 in | Wt 224.8 lb

## 2019-01-07 DIAGNOSIS — J029 Acute pharyngitis, unspecified: Secondary | ICD-10-CM | POA: Diagnosis not present

## 2019-01-07 DIAGNOSIS — H6690 Otitis media, unspecified, unspecified ear: Secondary | ICD-10-CM

## 2019-01-07 DIAGNOSIS — Z09 Encounter for follow-up examination after completed treatment for conditions other than malignant neoplasm: Secondary | ICD-10-CM

## 2019-01-07 DIAGNOSIS — I10 Essential (primary) hypertension: Secondary | ICD-10-CM | POA: Diagnosis not present

## 2019-01-07 LAB — POCT RAPID STREP A (OFFICE): Rapid Strep A Screen: NEGATIVE

## 2019-01-07 MED ORDER — AMOXICILLIN-POT CLAVULANATE 875-125 MG PO TABS: 1.0000 | ORAL_TABLET | Freq: Two times a day (BID) | ORAL | 0 refills | Status: DC

## 2019-01-07 NOTE — Progress Notes (Signed)
Established Patient Office Visit  Subjective:  Patient ID: Nicole Reyes, female    DOB: 01-11-1956  Age: 63 y.o. MRN: [id number]  CC:  Chief Complaint  Patient presents with  . Sore Throat    x 1 week with LEFT ear pain  . Eye Problem    x 1 week LEFT     HPI Nicole Reyes is a 63 year old who presents for Sick Visit today.   Past Medical History:  Diagnosis Date  . Allergy   . Anxiety   . Arthritis   . Asthma   . Hypertension    Current Status: Today she has c/o left ear, eye, and throat discomfort X 1 week. She has been taking Mucinex for relief. She denies fevers, chills, fatigue, recent infections, weight loss, and night sweats. She has not had any headaches, visual changes, dizziness, and falls. No chest pain, heart palpitations, cough and shortness of breath reported. No reports of GI problems such as nausea, vomiting, diarrhea, and constipation. She has no reports of blood in stools, dysuria and hematuria. No depression or anxiety reported. She denies pain today.   Past Surgical History:  Procedure Laterality Date  . COLONOSCOPY  06/29/2011   Normal  . COLONOSCOPY&ENDOSCOPY  1980's   PT UNSURE PLACE&MD WHO DID EXAMS=NORMAL  . DILATION AND CURETTAGE OF UTERUS    . LAPAROSCOPY     REMOVED SCARE TISSUE FROM OVARIES  . 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 Alcohol abuse Father   . Heart disease Father 41       AMI age 2  . Stroke Father 68       CVA  . Hypertension Mother   . Asthma Mother   . Hypertension Sister   . Hypertension Brother   . Hyperlipidemia Brother   . Hypertension Sister   . Hypertension Sister   . Hypertension Sister   . Hypertension Brother   . Colon cancer Neg Hx     Social History   Socioeconomic History  . Marital status: Single     Spouse name: n/a  . Number of children: 0  . Years of education: Not on file  . Highest education level: Not on file  Occupational History  . Occupation: employed    Fish farm manager: [employer]  Social Needs  . Financial resource strain: Not on file  . Food insecurity:    Worry: Not on file    Inability: Not on file  . Transportation needs:    Medical: Not on file    Non-medical: Not on file  Tobacco Use  . Smoking status: Never Smoker  . Smokeless tobacco: Never Used  Substance and Sexual Activity  . Alcohol use: No    Alcohol/week: 0.0 standard drinks  . Drug use: No  . Sexual activity: Yes    Birth control/protection: Post-menopausal  Lifestyle  . Physical activity:    Days per week: Not on file    Minutes per session: Not on file  . Stress: Not on file  Relationships  . Social connections:    Talks on phone: Not on file    Gets together: Not on file    Attends religious service: Not on file    Active member of club or organization: Not on file    Attends  meetings of clubs or organizations: Not on file    Relationship status: Not on file  . Intimate partner violence:    Fear of current or ex partner: Not on file    Emotionally abused: Not on file    Physically abused: Not on file    Forced sexual activity: Not on file  Other Topics Concern  . 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      Tobacco: none      Alcohol: none      Exercise:  Four days per week.    Outpatient Medications Prior to Visit  Medication Sig Dispense Refill  . albuterol (PROAIR HFA) 108 (90 Base) MCG/ACT inhaler INHALE 2 PUFFS INTO THE LUNGS EVERY 6 (SIX) HOURS AS NEEDED FOR WHEEZING. 25 Inhaler 2  . amLODipine (NORVASC) 10 MG tablet TAKE 1 TABLET BY MOUTH EVERY DAY 90 tablet 3  . aspirin EC 325 MG tablet Take 1 tablet (325 mg total) by mouth daily. 60 tablet 1  .  budesonide-formoterol (SYMBICORT) 80-4.5 MCG/ACT inhaler Inhale 2 puffs into the lungs 2 (two) times daily. 10.2 Inhaler 8  . cetirizine (ZYRTEC) 10 MG tablet Take 1 tablet (10 mg total) by mouth daily. 30 tablet 11  . docusate sodium (COLACE) 100 MG capsule Take 1 capsule (100 mg total) by mouth 2 (two) times daily. 60 capsule 2  . fluticasone (FLONASE) 50 MCG/ACT nasal spray Place 1 spray into both nostrils 2 (two) times daily as needed for allergies or rhinitis. 16 g 5  . montelukast (SINGULAIR) 10 MG tablet Take 1 tablet (10 mg total) by mouth at bedtime. 30 tablet 11  . Multiple Vitamins-Minerals (MULTIVITAMIN ADULT) CHEW Chew 2 each by mouth daily.    . Olopatadine HCl (PATADAY) 0.2 % SOLN Place 1 drop into both eyes daily as needed. (Patient taking differently: Place 1 drop into both eyes daily as needed (eye allergies). ) 1 Bottle 5  . clotrimazole-betamethasone (LOTRISONE) cream Apply 1 application topically 2 (two) times daily. 90 g 1  . levocetirizine (XYZAL) 5 MG tablet Take 1 tablet (5 mg total) by mouth every evening. (Patient not taking: Reported on 01/07/2019) 30 tablet 5  . methocarbamol (ROBAXIN) 500 MG tablet Take 1 tablet (500 mg total) by mouth every 6 (six) hours as needed for muscle spasms. (Patient not taking: Reported on 01/07/2019) 40 tablet 1  . polyethylene glycol (MIRALAX) packet Take 17 g by mouth daily. (Patient not taking: Reported on 01/07/2019) 14 each 0  . oxyCODONE (ROXICODONE) 5 MG immediate release tablet Take 1-2 tablets (5-10 mg total) by mouth every 4 (four) hours as needed. (Patient not taking: Reported on 01/07/2019) 40 tablet 0   Facility-Administered Medications Prior to Visit  Medication Dose Route Frequency Provider Last Rate Last Dose  . cetirizine (ZYRTEC) tablet 5 mg  5 mg Oral Daily Copland, Gay Filler, MD        Allergies  Allergen Reactions  . Shellfish Allergy Anaphylaxis  . Peanut-Containing Drug Products     Also walnuts and almonds      ROS Review of Systems  Constitutional: Negative.   HENT: Positive for ear pain (pain) and trouble swallowing (sore; left side).   Eyes: Positive for discharge (Left). Eye pain: Left.  Respiratory: Positive for cough (Increased at night)  and shortness of breath (occasionally).   Cardiovascular: Negative.   Endocrine: Negative.   Genitourinary: Negative.   Musculoskeletal: Negative.   Neurological: Negative.   Hematological: Negative.   Psychiatric/Behavioral: Negative.    Objective:    Physical Exam  Constitutional: She is oriented to person, place, and time. She appears well-developed and well-nourished.  HENT:  Head: Normocephalic and atraumatic.  Mouth/Throat:    Left ear and throat erythema  Eyes: Conjunctivae are normal.  Neck: Normal range of motion. Neck supple.  Cardiovascular: Normal rate, regular rhythm, normal heart sounds and intact distal pulses.  Pulmonary/Chest: Effort normal and breath sounds normal.  Abdominal: Soft. Bowel sounds are normal.  Musculoskeletal: Normal range of motion.  Neurological: She is alert and oriented to person, place, and time. She has normal reflexes.  Skin: Skin is warm and dry.  Psychiatric: She has a normal mood and affect. Her behavior is normal. Judgment and thought content normal.  Nursing note and vitals reviewed.   BP (!) 154/93   Pulse 83   Temp 98.1 F (36.7 C) (Oral)   Resp 16   Ht 5' 5.5" (1.664 m)   Wt 224 lb 12.8 oz (102 kg)   SpO2 100%   BMI 36.84 kg/m  Wt Readings from Last 3 Encounters:  01/07/19 224 lb 12.8 oz (102 kg)  08/31/18 218 lb (98.9 kg)  08/25/18 214 lb (97.1 kg)    There are no preventive care reminders to display for this patient.  There are no preventive care reminders to display for this patient.  Lab Results  Component Value Date   TSH 2.360 06/10/2018   Lab Results  Component Value Date   WBC 6.9 09/03/2018   HGB 12.4 09/03/2018   HCT 38.5 09/03/2018   MCV 82.1 09/03/2018    PLT 286 09/03/2018   Lab Results  Component Value Date   NA 139 09/01/2018   K 4.4 09/01/2018   CO2 27 09/01/2018   GLUCOSE 133 (H) 09/01/2018   BUN 13 09/01/2018   CREATININE 0.77 09/01/2018   BILITOT 0.5 06/10/2018   ALKPHOS 161 (H) 06/10/2018   AST 35 06/10/2018   ALT 32 06/10/2018   PROT 7.6 06/10/2018   ALBUMIN 4.5 06/10/2018   CALCIUM 9.0 09/01/2018   ANIONGAP 8 09/01/2018   Lab Results  Component Value Date   CHOL 221 (H) 06/10/2018   Lab Results  Component Value Date   HDL 58 06/10/2018   Lab Results  Component Value Date   LDLCALC 148 (H) 06/10/2018   Lab Results  Component Value Date   TRIG 77 06/10/2018   Lab Results  Component Value Date   CHOLHDL 3.8 06/10/2018   Lab Results  Component Value Date   HGBA1C 5.2 02/12/2017    Assessment & Plan:   1. Acute otitis media, unspecified otitis media type We will initiate Augmentin today. - amoxicillin-clavulanate (AUGMENTIN) 875-125 MG tablet; Take 1 tablet by mouth 2 (two) times daily.  Dispense: 14 tablet; Refill: 0  2. Sore throat Negative. Culture results are pending.  - POCT rapid strep A - Culture, Group A Strep  3. Essential hypertension  4. Follow up She will follow up as needed.   Meds ordered this encounter  Medications  . amoxicillin-clavulanate (AUGMENTIN) 875-125 MG tablet    Sig: Take 1 tablet by mouth 2 (two) times daily.    Dispense:  14 tablet    Refill:  0   Kathe Becton,  MSN, FNP-C [facility] [address] [place], [place] [address] [phone]  Problem List Items Addressed This Visit      Cardiovascular and Mediastinum   Hypertensive disorder    Other Visit Diagnoses    Acute otitis media, unspecified otitis media type    -  Primary   Relevant Medications   amoxicillin-clavulanate (AUGMENTIN) 875-125 MG tablet   Sore throat       Relevant Orders   POCT rapid strep A (Completed)   Culture, Group A Strep   Elevated blood  pressure reading       Follow up          Meds ordered this encounter  Medications  . amoxicillin-clavulanate (AUGMENTIN) 875-125 MG tablet    Sig: Take 1 tablet by mouth 2 (two) times daily.    Dispense:  14 tablet    Refill:  0    Follow-up: Return in about 1 month (around 02/07/2019).    Azzie Glatter, FNP

## 2019-01-07 NOTE — Patient Instructions (Addendum)
If you have lab work done today you will be contacted with your lab results within the next 2 weeks.  If you have not heard from [place] then please contact us. The fastest way to get your results is to register for My Chart.   IF you received an x-ray today, you will receive an invoice from [facility]. Please contact [facility] at [phone] with questions or concerns regarding your invoice.   IF you received labwork today, you will receive an invoice from [facility]. Please contact [facility] at [phone] with questions or concerns regarding your invoice.   Our billing staff will not be able to assist you with questions regarding bills from these companies.  You will be contacted with the lab results as soon as they are available. The fastest way to get your results is to activate your My Chart account. Instructions are located on the last page of this paperwork. If you have not heard from [place] regarding the results in 2 weeks, please contact this office.     Sore Throat When you have a sore throat, your throat may feel:  Tender.  Burning.  Irritated.  Scratchy.  Painful when you swallow.  Painful when you talk. Many things can cause a sore throat, such as:  An infection.  Allergies.  Dry air.  Smoke or pollution.  Radiation treatment.  Gastroesophageal reflux disease (GERD).  A tumor. A sore throat can be the first sign of another sickness. It can happen with other problems, like:  Coughing.  Sneezing.  Fever.  Swelling in the neck. Most sore throats go away without treatment. Follow these instructions at home:      Take over-the-counter medicines only as told by your doctor. ? If your child has a sore throat, do not give your child aspirin.  Drink enough fluids to keep your pee (urine) pale yellow.  Rest when you feel you need to.  To help with pain: ? Sip warm liquids, such as broth, herbal tea, or warm water. ? Eat or drink  cold or frozen liquids, such as frozen ice pops. ? Gargle with a salt-water mixture 3-4 times a day or as needed. To make a salt-water mixture, add -1 tsp (3-6 g) of salt to 1 cup (237 mL) of warm water. Mix it until you cannot see the salt anymore. ? Suck on hard candy or throat lozenges. ? Put a cool-mist humidifier in your bedroom at night. ? Sit in the bathroom with the door closed for 5-10 minutes while you run hot water in the shower.  Do not use any products that contain nicotine or tobacco, such as cigarettes, e-cigarettes, and chewing tobacco. If you need help quitting, ask your doctor.  Wash your hands well and often with soap and water. If soap and water are not available, use hand sanitizer. Contact a doctor if:  You have a fever for more than 2-3 days.  You keep having symptoms for more than 2-3 days.  Your throat does not get better in 7 days.  You have a fever and your symptoms suddenly get worse.  Your child who is 3 months to 33 years old has a temperature of 102.43F (39C) or higher. Get help right away if:  You have trouble breathing.  You cannot swallow fluids, soft foods, or your saliva.  You have swelling in your throat or neck that gets worse.  You keep feeling sick to your stomach (nauseous).  You keep throwing up (  vomiting). Summary  A sore throat is pain, burning, irritation, or scratchiness in the throat. Many things can cause a sore throat.  Take over-the-counter medicines only as told by your doctor. Do not give your child aspirin.  Drink plenty of fluids, and rest as needed.  Contact a doctor if your symptoms get worse or your sore throat does not get better within 7 days. This information is not intended to replace advice given to you by your health care provider. Make sure you discuss any questions you have with your health care provider. Document Released: 09/08/2008 Document Revised: 05/02/2018 Document Reviewed: 05/02/2018 Elsevier  Interactive Patient Education  2019 Elsevier Inc. Amoxicillin; Clavulanic Acid chewable tablets What is this medicine? AMOXICILLIN; CLAVULANIC ACID (a mox i SIL in; KLAV yoo lan ic AS id) is a penicillin antibiotic. It is used to treat certain kinds of bacterial infections. It It will not work for colds, flu, or other viral infections. This medicine may be used for other purposes; ask your health care provider or pharmacist if you have questions. COMMON BRAND NAME(S): Augmentin What should I tell my health care provider before I take this medicine? They need to know if you have any of these conditions: -bowel disease, like colitis -kidney disease -liver disease -mononucleosis -phenylketonuria -an unusual or allergic reaction to amoxicillin, penicillin, cephalosporin, other antibiotics, clavulanic acid, other medicines, foods, dyes, or preservatives -pregnant or trying to get pregnant -breast-feeding How should I use this medicine? Take this medicine by mouth. Chew it completely before swallowing. Follow the directions on the prescription label. Take this medicine at the start of a meal or snack. Take your medicine at regular intervals. Do not take your medicine more often than directed. Take all of your medicine as directed even if you think you are better. Do not skip doses or stop your medicine early. Talk to your pediatrician regarding the use of this medicine in children. While this drug may be prescribed for selected conditions, precautions do apply. Overdosage: If you think you have taken too much of this medicine contact a poison control center or emergency room at once. NOTE: This medicine is only for you. Do not share this medicine with others. What if I miss a dose? If you miss a dose, take it as soon as you can. If it is almost time for your next dose, take only that dose. Do not take double or extra doses. What may interact with this  medicine? -allopurinol -anticoagulants -birth control pills -methotrexate -probenecid This list may not describe all possible interactions. Give your health care provider a list of all the medicines, herbs, non-prescription drugs, or dietary supplements you use. Also tell them if you smoke, drink alcohol, or use illegal drugs. Some items may interact with your medicine. What should I watch for while using this medicine? Tell your doctor or health care professional if your symptoms do not improve. Do not treat diarrhea with over the counter products. Contact your doctor if you have diarrhea that lasts more than 2 days or if it is severe and watery. If you have diabetes, you may get a false-positive result for sugar in your urine. Check with your doctor or health care professional. Birth control pills may not work properly while you are taking this medicine. Talk to your doctor about using an extra method of birth control. What side effects may I notice from receiving this medicine? Side effects that you should report to your doctor or health care professional as soon  as possible: -allergic reactions like skin rash, itching or hives, swelling of the face, lips, or tongue -breathing problems -dark urine -fever or chills, sore throat -redness, blistering, peeling or loosening of the skin, including inside the mouth -seizures -trouble passing urine or change in the amount of urine -unusual bleeding, bruising -unusually weak or tired -white patches or sores in the mouth or throat Side effects that usually do not require medical attention (report to your doctor or health care professional if they continue or are bothersome): -diarrhea -dizziness -headache -nausea, vomiting -stomach upset -vaginal or anal irritation This list may not describe all possible side effects. Call your doctor for medical advice about side effects. You may report side effects to FDA at 1-800-FDA-1088. Where should I  keep my medicine? Keep out of the reach of children. Store at room temperature below 25 degrees C (77 degrees F). Keep container tightly closed. Throw away any unused medicine after the expiration date. NOTE: This sheet is a summary. It may not cover all possible information. If you have questions about this medicine, talk to your doctor, pharmacist, or health care provider.  2019 Elsevier/Gold Standard (2008-02-23 11:38:22)

## 2019-01-08 DIAGNOSIS — J029 Acute pharyngitis, unspecified: Secondary | ICD-10-CM | POA: Insufficient documentation

## 2019-01-08 DIAGNOSIS — H6690 Otitis media, unspecified, unspecified ear: Secondary | ICD-10-CM | POA: Insufficient documentation

## 2019-01-09 LAB — CULTURE, GROUP A STREP: Strep A Culture: NEGATIVE

## 2019-01-28 ENCOUNTER — Ambulatory Visit: Payer: Self-pay | Admitting: Family Medicine

## 2019-04-10 ENCOUNTER — Other Ambulatory Visit: Payer: Self-pay | Admitting: Physician Assistant

## 2019-04-10 DIAGNOSIS — J45909 Unspecified asthma, uncomplicated: Secondary | ICD-10-CM

## 2019-05-15 ENCOUNTER — Other Ambulatory Visit: Payer: Self-pay | Admitting: Physician Assistant

## 2019-05-15 DIAGNOSIS — I10 Essential (primary) hypertension: Secondary | ICD-10-CM

## 2019-06-04 ENCOUNTER — Other Ambulatory Visit: Payer: Self-pay | Admitting: Allergy and Immunology

## 2019-06-15 ENCOUNTER — Encounter: Payer: 59 | Admitting: Family Medicine

## 2019-06-21 ENCOUNTER — Encounter: Payer: 59 | Admitting: Family Medicine

## 2019-06-29 ENCOUNTER — Other Ambulatory Visit: Payer: Self-pay

## 2019-06-29 ENCOUNTER — Ambulatory Visit (INDEPENDENT_AMBULATORY_CARE_PROVIDER_SITE_OTHER): Payer: 59 | Admitting: Family Medicine

## 2019-06-29 ENCOUNTER — Encounter: Payer: Self-pay | Admitting: Family Medicine

## 2019-06-29 VITALS — BP 140/80 | HR 82 | Temp 97.8°F | Resp 14 | Wt 229.6 lb

## 2019-06-29 DIAGNOSIS — Z0001 Encounter for general adult medical examination with abnormal findings: Secondary | ICD-10-CM | POA: Diagnosis not present

## 2019-06-29 DIAGNOSIS — Z9189 Other specified personal risk factors, not elsewhere classified: Secondary | ICD-10-CM

## 2019-06-29 DIAGNOSIS — J45909 Unspecified asthma, uncomplicated: Secondary | ICD-10-CM

## 2019-06-29 DIAGNOSIS — I10 Essential (primary) hypertension: Secondary | ICD-10-CM | POA: Diagnosis not present

## 2019-06-29 DIAGNOSIS — Z889 Allergy status to unspecified drugs, medicaments and biological substances status: Secondary | ICD-10-CM

## 2019-06-29 DIAGNOSIS — Z1329 Encounter for screening for other suspected endocrine disorder: Secondary | ICD-10-CM

## 2019-06-29 DIAGNOSIS — L500 Allergic urticaria: Secondary | ICD-10-CM

## 2019-06-29 DIAGNOSIS — Z0000 Encounter for general adult medical examination without abnormal findings: Secondary | ICD-10-CM

## 2019-06-29 DIAGNOSIS — J302 Other seasonal allergic rhinitis: Secondary | ICD-10-CM

## 2019-06-29 DIAGNOSIS — Z13220 Encounter for screening for lipoid disorders: Secondary | ICD-10-CM

## 2019-06-29 DIAGNOSIS — Z6837 Body mass index (BMI) 37.0-37.9, adult: Secondary | ICD-10-CM

## 2019-06-29 DIAGNOSIS — Z23 Encounter for immunization: Secondary | ICD-10-CM

## 2019-06-29 MED ORDER — MONTELUKAST SODIUM 10 MG PO TABS: 10.0000 mg | ORAL_TABLET | Freq: Every day | ORAL | 1 refills | Status: DC

## 2019-06-29 MED ORDER — OLOPATADINE HCL 0.2 % OP SOLN: 1.0000 [drp] | Freq: Every day | OPHTHALMIC | 5 refills | Status: DC | PRN

## 2019-06-29 MED ORDER — CETIRIZINE HCL 10 MG PO TABS: 10.0000 mg | ORAL_TABLET | Freq: Every day | ORAL | 1 refills | Status: DC

## 2019-06-29 MED ORDER — BUDESONIDE-FORMOTEROL FUMARATE 80-4.5 MCG/ACT IN AERO: 2.0000 | INHALATION_SPRAY | Freq: Two times a day (BID) | RESPIRATORY_TRACT | 8 refills | Status: DC

## 2019-06-29 MED ORDER — AMLODIPINE BESYLATE 10 MG PO TABS: 10.0000 mg | ORAL_TABLET | Freq: Every day | ORAL | 1 refills | Status: DC

## 2019-06-29 MED ORDER — SHINGRIX 50 MCG/0.5ML IM SUSR: 0.5000 mL | Freq: Once | INTRAMUSCULAR | 1 refills | Status: AC

## 2019-06-29 NOTE — Progress Notes (Signed)
Subjective:    Patient ID: Nicole Reyes, female    DOB: October 11, 1956, 63 y.o.   MRN: [id number]  HPI  Nicole Reyes is a 63 y.o. female Presents today for: Chief Complaint  Patient presents with  . Annual Exam    patient is here for her annual physcal. Patient is doing well  Presenting for annual exam.  Previous patient of Whitney Mcveigh.  I saw her in 2015 for asthma exacerbation  History of asthma, hypertension, allergic rhinitis.  Asthma: symbicort 80/4/5mg  2 puffs BID, albuterol as needed. singulair Albuterol 2 times per week only. No nighttime symptoms. Feels well controlled.  Allergist: Bobbitt. Plans to schedule visit.  Allergic reaction to shrimp last week - itching all over, no face swelling, no throat/respiratory sx's. Gave self Epipen, but no ER eval. Symptoms improved later that night.   Allergic rhinitis: As above, followed by allergist.  On zyrtec, singulair daily - needs refills.   Hypertension: BP Readings from Last 3 Encounters:  06/29/19 140/80  01/07/19 (!) 154/93  09/03/18 (!) 148/73   Lab Results  Component Value Date   CREATININE 0.77 09/01/2018  on norvasc 10mg  qd no missed doses.  No home readings. Occasional ankle swelling with standing all day, improves overnight.    Cancer screening: Colon 06/29/11- repeat 10 yrs.  Pap 10/2018 Mammogram 10/2018.    Immunization History  Administered Date(s) Administered  . DTaP 01/21/2011  . Influenza Split 09/15/2016  . Influenza, Seasonal, Injecte, Preservative Fre 09/15/2015  . Influenza,inj,Quad PF,6+ Mos 10/02/2017  . Influenza-Unspecified 09/13/2013, 09/15/2016  . Pneumococcal Polysaccharide-23 03/27/2013  shingles vaccine: has not had.   Depression screen [facility] 2/9 06/29/2019 01/07/2019 06/10/2018 05/31/2018 03/23/2018  Decreased Interest 0 0 0 0 0  Down, Depressed, Hopeless 0 0 0 0 0  PHQ - 2 Score 0 0 0 0 0  Altered sleeping - - - - -  Tired, decreased energy - - - - -  Change in  appetite - - - - -  Feeling bad or failure about yourself  - - - - -  Trouble concentrating - - - - -  Moving slowly or fidgety/restless - - - - -  Suicidal thoughts - - - - -  PHQ-9 Score - - - - -  Difficult doing work/chores - - - - -    Hearing Screening   125Hz  250Hz  500Hz  1000Hz  2000Hz  3000Hz  4000Hz  6000Hz  8000Hz   Right ear:           Left ear:             Visual Acuity Screening   Right eye Left eye Both eyes  Without correction: 20/100 20/50 20/30  With correction:     History of dry eye syndrome with bilateral cataracts noted on problem list.  Ophthalmologist visit 1 year ago - will be scheduling appt.   Dental: no recent visit.   Body mass index is 37.63 kg/m. Exercise: no regular exercise.    Patient Active Problem List   Diagnosis Date Noted  . Acute otitis media 01/08/2019  . Sore throat 01/08/2019  . Osteoarthritis of right knee 08/31/2018  . Right knee DJD 08/31/2018  . Allergic reaction 05/23/2018  . Allergic urticaria 05/23/2018  . Perennial and seasonal allergic rhinitis 05/23/2018  . Allergic conjunctivitis 05/23/2018  . Food allergy 05/23/2018  . Carpal tunnel syndrome of left wrist 01/25/2018  . 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 Dry eye syndrome 11/20/2017  . Cataracts, bilateral 11/20/2017  . Chronic right  SI joint pain 01/28/2016  . 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 Obesity 01/25/2015  . Pure hypercholesterolemia 02/10/2014  . Moderate persistent asthma 12/19/2012  . Hypertensive disorder 12/19/2012   Past Medical History:  Diagnosis Date  . Allergy   . Anxiety   . Arthritis   . Asthma   . Hypertension    Past Surgical History:  Procedure Laterality Date  . COLONOSCOPY  06/29/2011   Normal  . COLONOSCOPY&ENDOSCOPY  1980's   PT UNSURE PLACE&MD WHO DID EXAMS=NORMAL  . DILATION AND CURETTAGE OF UTERUS    . LAPAROSCOPY     REMOVED SCARE TISSUE FROM OVARIES  . TOTAL KNEE ARTHROPLASTY Right 08/31/2018   Procedure: RIGHT  TOTAL KNEE  ARTHROPLASTY;  Surgeon: Susa Day, MD;  Location: [facility];  Service: Orthopedics;  Laterality: Right;   Allergies  Allergen Reactions  . Shellfish Allergy Anaphylaxis  . Peanut-Containing Drug Products     Also walnuts and almonds    Prior to Admission medications   Medication Sig Start Date End Date Taking? Authorizing Provider  albuterol (PROAIR HFA) 108 (90 Base) MCG/ACT inhaler TAKE 2 PUFFS BY MOUTH EVERY 6 HOURS AS NEEDED FOR WHEEZE 04/10/19  Yes McVey, Gelene Mink, PA-C  amLODipine (NORVASC) 10 MG tablet TAKE 1 TABLET BY MOUTH EVERY DAY 05/15/19  Yes McVey, Gelene Mink, PA-C  aspirin EC 325 MG tablet Take 1 tablet (325 mg total) by mouth daily. 08/31/18 08/31/19 Yes Lacie Draft M, PA-C  budesonide-formoterol (SYMBICORT) 80-4.5 MCG/ACT inhaler Inhale 2 puffs into the lungs 2 (two) times daily. 11/02/18  Yes McVey, Gelene Mink, PA-C  cetirizine (ZYRTEC) 10 MG tablet Take 1 tablet (10 mg total) by mouth daily. 10/02/17  Yes English, Colletta Maryland D, PA  montelukast (SINGULAIR) 10 MG tablet Take 1 tablet (10 mg total) by mouth at bedtime. 03/23/18  Yes McVey, Gelene Mink, PA-C  Olopatadine HCl (PATADAY) 0.2 % SOLN Place 1 drop into both eyes daily as needed. Patient taking differently: Place 1 drop into both eyes daily as needed (eye allergies).  05/23/18  Yes Bobbitt, Sedalia Muta, MD   Social History   Socioeconomic History  . Marital status: Single    Spouse name: n/a  . Number of children: 0  . Years of education: Not on file  . Highest education level: Not on file  Occupational History  . Occupation: employed    Fish farm manager: [employer]  Social Needs  . Financial resource strain: Not on file  . Food insecurity    Worry: Not on file    Inability: Not on file  . Transportation needs    Medical: Not on file    Non-medical: Not on file  Tobacco Use  . Smoking status: Never Smoker  . Smokeless tobacco: Never Used  Substance and Sexual Activity  .  Alcohol use: No    Alcohol/week: 0.0 standard drinks  . Drug use: No  . Sexual activity: Yes    Birth control/protection: Post-menopausal  Lifestyle  . Physical activity    Days per week: Not on file    Minutes per session: Not on file  . Stress: Not on file  Relationships  . Social Herbalist on phone: Not on file    Gets together: Not on file    Attends religious service: Not on file    Active member of club or organization: Not on file    Attends meetings of clubs or organizations: Not on file    Relationship status: Not on  file  . Intimate partner violence    Fear of current or ex partner: Not on file    Emotionally abused: Not on file    Physically abused: Not on file    Forced sexual activity: Not on file  Other Topics Concern  . 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      Tobacco: none      Alcohol: none      Exercise:  Four days per week.    Review of Systems 13 point review of systems per patient health survey noted.  Negative other than as indicated above or in HPI.      Objective:   Physical Exam Constitutional:      Appearance: She is well-developed.  HENT:     Head: Normocephalic and atraumatic.     Right Ear: External ear normal.     Left Ear: External ear normal.  Eyes:     Conjunctiva/sclera: Conjunctivae normal.     Pupils: Pupils are equal, round, and reactive to light.  Neck:     Musculoskeletal: Normal range of motion and neck supple.     Thyroid: No thyromegaly.  Cardiovascular:     Rate and Rhythm: Normal rate and regular rhythm.     Heart sounds: Normal heart sounds. No murmur.  Pulmonary:     Effort: Pulmonary effort is normal. No respiratory distress.     Breath sounds: Normal breath sounds. No wheezing.  Abdominal:     General: Bowel sounds are normal.     Palpations: Abdomen is soft.     Tenderness:  There is no abdominal tenderness.  Musculoskeletal: Normal range of motion.        General: No tenderness.  Lymphadenopathy:     Cervical: No cervical adenopathy.  Skin:    General: Skin is warm and dry.     Findings: No rash.  Neurological:     Mental Status: She is alert and oriented to person, place, and time.  Psychiatric:        Behavior: Behavior normal.        Thought Content: Thought content normal.    Vitals:   06/29/19 1352 06/29/19 1410  BP: 140/88 140/80  Pulse: 82   Resp: 14   Temp: 97.8 F (36.6 C)   TempSrc: Oral   SpO2: 96%   Weight: 229 lb 9.6 oz (104.1 kg)        Assessment & Plan:   Nicole Reyes is a 63 y.o. female Annual physical exam  - -anticipatory guidance as below in AVS, screening labs above. Health maintenance items as above in HPI discussed/recommended as applicable.   Essential hypertension - Plan: Comprehensive metabolic panel, amLODipine (NORVASC) 10 MG tablet, TSH  -Borderline, check labs, continue same regimen for now with home monitoring.  RTC precautions.  Screening, lipid - Plan: Lipid Panel  H/O multiple allergies - Plan: cetirizine (ZYRTEC) 10 MG tablet Allergic urticaria - Plan: cetirizine (ZYRTEC) 10 MG tablet Seasonal allergies - Plan: montelukast (SINGULAIR) 10 MG tablet, Olopatadine HCl (PATADAY) 0.2 % SOLN  -Continue Zyrtec, Singulair, Pataday, refilled, follow-up with allergist.  BMI 37.0-37.9, adult  -Increase physical activity, exercise with low intensity initially.  Initial goal BMI less than 35.  Option of medical bariatric specialist eval if difficulty with weight loss.  Extrinsic asthma without complication, unspecified asthma severity, unspecified whether persistent - Plan: budesonide-formoterol (SYMBICORT) 80-4.5 MCG/ACT inhaler  -Stable with use of Symbicort, continue same, follow-up with allergist.  Need for shingles vaccine - Plan: Zoster Vaccine Adjuvanted [facility]) injection  -Sent to pharmacy, but  advised to check with insurance regarding coverage.  Screening for thyroid disorder - Plan: TSH   Meds ordered this encounter  Medications  . cetirizine (ZYRTEC) 10 MG tablet    Sig: Take 1 tablet (10 mg total) by mouth daily.    Dispense:  90 tablet    Refill:  1  . montelukast (SINGULAIR) 10 MG tablet    Sig: Take 1 tablet (10 mg total) by mouth at bedtime.    Dispense:  90 tablet    Refill:  1  . amLODipine (NORVASC) 10 MG tablet    Sig: Take 1 tablet (10 mg total) by mouth daily.    Dispense:  90 tablet    Refill:  1  . budesonide-formoterol (SYMBICORT) 80-4.5 MCG/ACT inhaler    Sig: Inhale 2 puffs into the lungs 2 (two) times daily.    Dispense:  10.2 Inhaler    Refill:  8  . Olopatadine HCl (PATADAY) 0.2 % SOLN    Sig: Place 1 drop into both eyes daily as needed.    Dispense:  2.5 mL    Refill:  5  . Zoster Vaccine Adjuvanted [facility]) injection    Sig: Inject 0.5 mLs into the muscle once for 1 dose. Repeat in 2-6 months.    Dispense:  0.5 mL    Refill:  1   Patient Instructions   Call Dr. Verlin Fester for follow up appointment and discuss any needed repeat testing.  No change in Symbicort or albuterol for now.   Keep a record of your blood pressures outside of the office and bring them to the next office visit.  Schedule appointment with your eye care provider.   Schedule dentist appointment. Here are some resources if needed: [facility]  Address: [address], [place], [place] [address]  [email or web address]  Phone: [phone]   [facility]  Address: [address], [place], [place] [address] Phone: [phone]   Exercise: goal of 150 mins per week. Start low intensity such as walking. Goal of decreasing   Shingles vaccine sent to pharmacy.    If you have lab work done today you will be contacted with your lab results within the next 2 weeks.  If you have not heard from [place] then please contact us. The fastest way to  get your results is to register for My Chart.   IF you received an x-ray today, you will receive an invoice from [facility]. Please contact [facility] at [phone] with questions or concerns regarding your invoice.   IF you received labwork today, you will receive an invoice from [facility]. Please contact [facility] at [phone] with questions or concerns regarding your invoice.   Our billing staff will not be able to assist you with questions regarding bills from these companies.  You will be contacted with the lab results as soon as they are available. The fastest way to get your results is to activate your My Chart account. Instructions are located on the last page of this paperwork. If you have not heard from [place] regarding the results in 2 weeks, please contact this office.     Keeping You Healthy  Get These Tests  Blood Pressure- Have your blood pressure  checked by your healthcare provider at least once a year.  Normal blood pressure is 120/80.  Weight- Have your body mass index (BMI) calculated to screen for obesity.  BMI is a measure of body fat based on height and weight.  You can calculate your own BMI at [employer]velBags.it  Cholesterol- Have your cholesterol checked every year.  Diabetes- Have your blood sugar checked every year if you have high blood pressure, high cholesterol, a family history of diabetes or if you are overweight.  Pap Test - Have a pap test every 1 to 5 years if you have been sexually active.  If you are older than 65 and recent pap tests have been normal you may not need additional pap tests.  In addition, if you have had a hysterectomy  for benign disease additional pap tests are not necessary.  Mammogram-Yearly mammograms are essential for early detection of breast cancer  Screening for Colon Cancer- Colonoscopy starting at age 33. Screening may begin sooner depending on your family history and other health conditions.  Follow  up colonoscopy as directed by your Gastroenterologist.  Screening for Osteoporosis- Screening begins at age 63 with bone density scanning, sooner if you are at higher risk for developing Osteoporosis.  Get these medicines  Calcium with Vitamin D- Your body requires 1200-1500 mg of Calcium a day and [phone] IU of Vitamin D a day.  You can only absorb 500 mg of Calcium at a time therefore Calcium must be taken in 2 or 3 separate doses throughout the day.  Hormones- Hormone therapy has been associated with increased risk for certain cancers and heart disease.  Talk to your healthcare provider about if you need relief from menopausal symptoms.  Aspirin- Ask your healthcare provider about taking Aspirin to prevent Heart Disease and Stroke.  Get these Immuniztions  Flu shot- Every fall  Pneumonia shot- Once after the age of 7; if you are younger ask your healthcare provider if you need a pneumonia shot.  Tetanus- Every ten years.  Zostavax- Once after the age of 66 to prevent shingles.  Take these steps  Don't smoke- Your healthcare provider can help you quit. For tips on how to quit, ask your healthcare provider or go to www.smokefree.gov or call 1-800 QUIT-NOW.  Be physically active- Exercise 5 days a week for a minimum of 30 minutes.  If you are not already physically active, start slow and gradually work up to 30 minutes of moderate physical activity.  Try walking, dancing, bike riding, swimming, etc.  Eat a healthy diet- Eat a variety of healthy foods such as fruits, vegetables, whole grains, low fat milk, low fat cheeses, yogurt, lean meats, chicken, fish, eggs, dried beans, tofu, etc.  For more information go to www.thenutritionsource.org  Dental visit- Brush and floss teeth twice daily; visit your dentist twice a year.  Eye exam- Visit your Optometrist or Ophthalmologist yearly.  Drink alcohol in moderation- Limit alcohol intake to one drink or less a day.  Never drink and  drive.  Depression- Your emotional health is as important as your physical health.  If you're feeling down or losing interest in things you normally enjoy, please talk to your healthcare provider.  Seat Belts- can save your life; always wear one  Smoke/Carbon Monoxide detectors- These detectors need to be installed on the appropriate level of your home.  Replace batteries at least once a year.  Violence- If anyone is threatening or hurting you, please tell your healthcare provider.  Living  Will/ Health care power of attorney- Discuss with your healthcare provider and family.  Signed,   Merri Ray, MD [facility].  06/29/19 10:06 PM

## 2019-06-29 NOTE — Patient Instructions (Addendum)
Call Dr. Verlin Fester for follow up appointment and discuss any needed repeat testing.  No change in Symbicort or albuterol for now.   Keep a record of your blood pressures outside of the office and bring them to the next office visit.  Schedule appointment with your eye care provider.   Schedule dentist appointment. Here are some resources if needed: [facility]  Address: [place], [place], [place] [address]  [email or web address]  Phone: [phone]   [facility]  Address: [address], [place], [place] [address] Phone: [phone]   Exercise: goal of 150 mins per week. Start low intensity such as walking. Goal of decreasing   Shingles vaccine sent to pharmacy.    If you have lab work done today you will be contacted with your lab results within the next 2 weeks.  If you have not heard from [place] then please contact us. The fastest way to get your results is to register for My Chart.   IF you received an x-ray today, you will receive an invoice from [facility]. Please contact [facility] at [phone] with questions or concerns regarding your invoice.   IF you received labwork today, you will receive an invoice from [facility]. Please contact [facility] at [phone] with questions or concerns regarding your invoice.   Our billing staff will not be able to assist you with questions regarding bills from these companies.  You will be contacted with the lab results as soon as they are available. The fastest way to get your results is to activate your My Chart account. Instructions are located on the last page of this paperwork. If you have not heard from [place] regarding the results in 2 weeks, please contact this office.     Keeping You Healthy  Get These Tests  Blood Pressure- Have your blood pressure checked by your healthcare provider at least once a year.  Normal blood pressure is 120/80.  Weight- Have your body mass index  (BMI) calculated to screen for obesity.  BMI is a measure of body fat based on height and weight.  You can calculate your own BMI at [employer].it  Cholesterol- Have your cholesterol checked every year.  Diabetes- Have your blood sugar checked every year if you have high blood pressure, high cholesterol, a family history of diabetes or if you are overweight.  Pap Test - Have a pap test every 1 to 5 years if you have been sexually active.  If you are older than 65 and recent pap tests have been normal you may not need additional pap tests.  In addition, if you have had a hysterectomy  for benign disease additional pap tests are not necessary.  Mammogram-Yearly mammograms are essential for early detection of breast cancer  Screening for Colon Cancer- Colonoscopy starting at age 86. Screening may begin sooner depending on your family history and other health conditions.  Follow up colonoscopy as directed by your Gastroenterologist.  Screening for Osteoporosis- Screening begins at age 44 with bone density scanning, sooner if you are at higher risk for developing Osteoporosis.  Get these medicines  Calcium with Vitamin D- Your body requires 1200-1500 mg of Calcium a day and [phone] IU of Vitamin D a day.  You can only absorb 500 mg of Calcium at a time therefore Calcium must be taken in 2 or 3 separate doses throughout the day.  Hormones- Hormone therapy has been associated with increased risk for certain cancers and heart disease.  Talk  to your healthcare provider about if you need relief from menopausal symptoms.  Aspirin- Ask your healthcare provider about taking Aspirin to prevent Heart Disease and Stroke.  Get these Immuniztions  Flu shot- Every fall  Pneumonia shot- Once after the age of 24; if you are younger ask your healthcare provider if you need a pneumonia shot.  Tetanus- Every ten years.  Zostavax- Once after the age of 49 to prevent shingles.  Take these  steps  Don't smoke- Your healthcare provider can help you quit. For tips on how to quit, ask your healthcare provider or go to www.smokefree.gov or call 1-800 QUIT-NOW.  Be physically active- Exercise 5 days a week for a minimum of 30 minutes.  If you are not already physically active, start slow and gradually work up to 30 minutes of moderate physical activity.  Try walking, dancing, bike riding, swimming, etc.  Eat a healthy diet- Eat a variety of healthy foods such as fruits, vegetables, whole grains, low fat milk, low fat cheeses, yogurt, lean meats, chicken, fish, eggs, dried beans, tofu, etc.  For more information go to www.thenutritionsource.org  Dental visit- Brush and floss teeth twice daily; visit your dentist twice a year.  Eye exam- Visit your Optometrist or Ophthalmologist yearly.  Drink alcohol in moderation- Limit alcohol intake to one drink or less a day.  Never drink and drive.  Depression- Your emotional health is as important as your physical health.  If you're feeling down or losing interest in things you normally enjoy, please talk to your healthcare provider.  Seat Belts- can save your life; always wear one  Smoke/Carbon Monoxide detectors- These detectors need to be installed on the appropriate level of your home.  Replace batteries at least once a year.  Violence- If anyone is threatening or hurting you, please tell your healthcare provider.  Living Will/ Health care power of attorney- Discuss with your healthcare provider and family.

## 2019-06-30 LAB — LIPID PANEL
Chol/HDL Ratio: 4.2 ratio (ref 0.0–4.4)
Cholesterol, Total: 225 mg/dL — ABNORMAL HIGH (ref 100–199)
HDL: 53 mg/dL (ref >39)
LDL Calculated: 155 mg/dL — ABNORMAL HIGH (ref 0–99)
Triglycerides: 83 mg/dL (ref 0–149)
VLDL Cholesterol Cal: 17 mg/dL (ref 5–40)

## 2019-06-30 LAB — COMPREHENSIVE METABOLIC PANEL
ALT: 31 IU/L (ref 0–32)
AST: 33 IU/L (ref 0–40)
Albumin/Globulin Ratio: 1.6 (ref 1.2–2.2)
Albumin: 4.7 g/dL (ref 3.8–4.8)
Alkaline Phosphatase: 176 IU/L — ABNORMAL HIGH (ref 39–117)
BUN/Creatinine Ratio: 20 (ref 12–28)
BUN: 16 mg/dL (ref 8–27)
Bilirubin Total: 0.6 mg/dL (ref 0.0–1.2)
CO2: 20 mmol/L (ref 20–29)
Calcium: 9.4 mg/dL (ref 8.7–10.3)
Chloride: 107 mmol/L — ABNORMAL HIGH (ref 96–106)
Creatinine, Ser: 0.8 mg/dL (ref 0.57–1.00)
GFR calc Af Amer: 91 mL/min/{1.73_m2} (ref >59)
GFR calc non Af Amer: 79 mL/min/{1.73_m2} (ref >59)
Globulin, Total: 2.9 g/dL (ref 1.5–4.5)
Glucose: 94 mg/dL (ref 65–99)
Potassium: 4 mmol/L (ref 3.5–5.2)
Sodium: 142 mmol/L (ref 134–144)
Total Protein: 7.6 g/dL (ref 6.0–8.5)

## 2019-06-30 LAB — TSH: TSH: 0.996 u[IU]/mL (ref 0.450–4.500)

## 2019-07-05 ENCOUNTER — Encounter (HOSPITAL_COMMUNITY): Payer: Self-pay | Admitting: Radiology

## 2019-07-20 ENCOUNTER — Ambulatory Visit: Payer: 59 | Admitting: Family Medicine

## 2019-07-20 ENCOUNTER — Other Ambulatory Visit: Payer: Self-pay

## 2019-07-20 ENCOUNTER — Encounter: Payer: Self-pay | Admitting: Family Medicine

## 2019-07-20 ENCOUNTER — Telehealth: Payer: Self-pay | Admitting: Family Medicine

## 2019-07-20 VITALS — BP 140/83 | HR 89 | Temp 98.5°F | Resp 14 | Wt 235.0 lb

## 2019-07-20 DIAGNOSIS — E785 Hyperlipidemia, unspecified: Secondary | ICD-10-CM | POA: Diagnosis not present

## 2019-07-20 DIAGNOSIS — R748 Abnormal levels of other serum enzymes: Secondary | ICD-10-CM

## 2019-07-20 DIAGNOSIS — I10 Essential (primary) hypertension: Secondary | ICD-10-CM | POA: Diagnosis not present

## 2019-07-20 MED ORDER — ATORVASTATIN CALCIUM 10 MG PO TABS: 10.0000 mg | ORAL_TABLET | Freq: Every day | ORAL | 0 refills | Status: DC

## 2019-07-20 MED ORDER — HYDROCHLOROTHIAZIDE 12.5 MG PO CAPS: 12.5000 mg | ORAL_CAPSULE | Freq: Every day | ORAL | 1 refills | Status: DC

## 2019-07-20 NOTE — Patient Instructions (Addendum)
  Continue amlodipine 10 mg once per day, but also add the hydrochlorothiazide pill once per day for improved blood pressure control.  If that is too strong or you are getting lightheaded, dizzy or other new side effects that might indicate that your blood pressure is running too low and you can return to just the amlodipine if needed. Keep a record of your blood pressures outside of the office and let me know those readings.   Start lipitor once per day for cholesterol and can recheck in 6 weeks.  If new side effects from that medicine the me know.  I will check the alkaline phosphatase along with another blood test today to determine if further work-up needed.  Thank you for coming in today.  Return to the clinic or go to the nearest emergency room if any of your symptoms worsen or new symptoms occur.    If you have lab work done today you will be contacted with your lab results within the next 2 weeks.  If you have not heard from [place] then please contact us. The fastest way to get your results is to register for My Chart.   IF you received an x-ray today, you will receive an invoice from [facility]. Please contact [facility] at [phone] with questions or concerns regarding your invoice.   IF you received labwork today, you will receive an invoice from [facility]. Please contact [facility] at [phone] with questions or concerns regarding your invoice.   Our billing staff will not be able to assist you with questions regarding bills from these companies.  You will be contacted with the lab results as soon as they are available. The fastest way to get your results is to activate your My Chart account. Instructions are located on the last page of this paperwork. If you have not heard from [place] regarding the results in 2 weeks, please contact this office.    \

## 2019-07-20 NOTE — Progress Notes (Signed)
Subjective:    Patient ID: Nicole Reyes, female    DOB: [birth year], 63 y.o.   MRN: [id number]  HPI Nicole Reyes is a 63 y.o. female Presents today for: Chief Complaint  Patient presents with  . Follow-up    Patient was seen here for her physical on 06/29/19 here to go over lab. Patient has not been able to check bp at home   Hypertension: BP Readings from Last 3 Encounters:  07/20/19 140/83  06/29/19 140/80  01/07/19 (!) 154/93   Lab Results  Component Value Date   CREATININE 0.80 06/29/2019  Annual physical exam/16, borderline levels at that time.  Continue same regimen with amlodipine 10 mg with home monitoring recommended.  She has not been able to check blood pressures at home. Plans on obtaining machine.  feels some headache at times. No CP/dyspnea. No vision change, no focal weakness.   Hyperlipidemia:  Lab Results  Component Value Date   CHOL 225 (H) 06/29/2019   HDL 53 06/29/2019   LDLCALC 155 (H) 06/29/2019   TRIG 83 06/29/2019   CHOLHDL 4.2 06/29/2019   Lab Results  Component Value Date   ALT 31 06/29/2019   AST 33 06/29/2019   ALKPHOS 176 (H) 06/29/2019   BILITOT 0.6 06/29/2019  The 10-year ASCVD risk score Mikey Bussing DC Jr., et al., 2013) is: 11%   Values used to calculate the score:     Age: 89 years     Sex: Female     Is Non-Hispanic African American: Yes     Diabetic: No     Tobacco smoker: No     Systolic Blood Pressure: 712 mmHg     Is BP treated: Yes     HDL Cholesterol: 53 mg/dL     Total Cholesterol: 225 mg/dL Statin discussed - agrees to start.   Elevated Alk Phos: 176 on July 16.  161 in June 2019. No n/v/abd pain. Feels like stomach swells at times. Last ate banana a 6am (3.5 hrs ago).  Plan for GGT and repeat today to determine endocrine eval or RUQ u/s as next step.      Patient Active Problem List   Diagnosis Date Noted  . Acute otitis media 01/08/2019  . Sore throat 01/08/2019  . Osteoarthritis of right knee  08/31/2018  . Right knee DJD 08/31/2018  . Allergic reaction 05/23/2018  . Allergic urticaria 05/23/2018  . Perennial and seasonal allergic rhinitis 05/23/2018  . Allergic conjunctivitis 05/23/2018  . Food allergy 05/23/2018  . Carpal tunnel syndrome of left wrist 01/25/2018  . 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 Dry eye syndrome 11/20/2017  . Cataracts, bilateral 11/20/2017  . 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 Obesity 01/25/2015  . Pure hypercholesterolemia 02/10/2014  . Moderate persistent asthma 12/19/2012  . Hypertensive disorder 12/19/2012   Past Medical History:  Diagnosis Date  . Allergy   . Anxiety   . Arthritis   . Asthma   . Hypertension    Past Surgical History:  Procedure Laterality Date  . COLONOSCOPY  06/29/2011   Normal  . COLONOSCOPY&ENDOSCOPY  1980's   PT UNSURE PLACE&MD WHO DID EXAMS=NORMAL  . DILATION AND CURETTAGE OF UTERUS    . LAPAROSCOPY     REMOVED SCARE TISSUE FROM OVARIES  . TOTAL KNEE ARTHROPLASTY Right 08/31/2018   Procedure: RIGHT  TOTAL KNEE ARTHROPLASTY;  Surgeon: Susa Day, MD;  Location: [facility];  Service: Orthopedics;  Laterality: Right;   Allergies  Allergen Reactions  . Shellfish Allergy Anaphylaxis  . Peanut-Containing Drug Products     Also walnuts and almonds    Prior to Admission medications   Medication Sig Start Date End Date Taking? Authorizing Provider  albuterol (PROAIR HFA) 108 (90 Base) MCG/ACT inhaler TAKE 2 PUFFS BY MOUTH EVERY 6 HOURS AS NEEDED FOR WHEEZE 04/10/19  Yes McVey, Gelene Mink, PA-C  amLODipine (NORVASC) 10 MG tablet Take 1 tablet (10 mg total) by mouth daily. 06/29/19  Yes Wendie Agreste, MD  aspirin EC 325 MG tablet Take 1 tablet (325 mg total) by mouth daily. 08/31/18 08/31/19 Yes Lacie Draft M, PA-C  budesonide-formoterol (SYMBICORT) 80-4.5 MCG/ACT inhaler Inhale 2 puffs into the lungs 2 (two) times daily. 06/29/19  Yes Wendie Agreste, MD  cetirizine  (ZYRTEC) 10 MG tablet Take 1 tablet (10 mg total) by mouth daily. 06/29/19  Yes Wendie Agreste, MD  montelukast (SINGULAIR) 10 MG tablet Take 1 tablet (10 mg total) by mouth at bedtime. 06/29/19  Yes Wendie Agreste, MD  Olopatadine HCl (PATADAY) 0.2 % SOLN Place 1 drop into both eyes daily as needed. 06/29/19  Yes Wendie Agreste, MD   Social History   Socioeconomic History  . Marital status: Single    Spouse name: n/a  . Number of children: 0  . Years of education: Not on file  . Highest education level: Not on file  Occupational History  . Occupation: employed    Fish farm manager: [employer]  Social Needs  . Financial resource strain: Not on file  . Food insecurity    Worry: Not on file    Inability: Not on file  . Transportation needs    Medical: Not on file    Non-medical: Not on file  Tobacco Use  . Smoking status: Never Smoker  . Smokeless tobacco: Never Used  Substance and Sexual Activity  . Alcohol use: No    Alcohol/week: 0.0 standard drinks  . Drug use: No  . Sexual activity: Yes    Birth control/protection: Post-menopausal  Lifestyle  . Physical activity    Days per week: Not on file    Minutes per session: Not on file  . Stress: Not on file  Relationships  . Social Herbalist on phone: Not on file    Gets together: Not on file    Attends religious service: Not on file    Active member of club or organization: Not on file    Attends meetings of clubs or organizations: Not on file    Relationship status: Not on file  . Intimate partner violence    Fear of current or ex partner: Not on file    Emotionally abused: Not on file    Physically abused: Not on file    Forced sexual activity: Not on file  Other Topics Concern  . 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      Tobacco: none      Alcohol: none      Exercise:  Four  days per week.    Review of Systems Per HPI.     Objective:   Physical Exam Vitals signs reviewed.  Constitutional:      Appearance: She is well-developed.  HENT:     Head: Normocephalic and  atraumatic.  Eyes:     Conjunctiva/sclera: Conjunctivae normal.     Pupils: Pupils are equal, round, and reactive to light.  Neck:     Vascular: No carotid bruit.  Cardiovascular:     Rate and Rhythm: Normal rate and regular rhythm.     Heart sounds: Normal heart sounds.  Pulmonary:     Effort: Pulmonary effort is normal.     Breath sounds: Normal breath sounds.  Abdominal:     Palpations: Abdomen is soft. There is no pulsatile mass.     Tenderness: There is no abdominal tenderness. There is no guarding or rebound.  Musculoskeletal:     Right lower leg: Edema present.     Left lower leg: Edema (1+ pedal, L>R) present.  Skin:    General: Skin is warm and dry.  Neurological:     Mental Status: She is alert and oriented to person, place, and time.  Psychiatric:        Behavior: Behavior normal.    Vitals:   07/20/19 0835 07/20/19 0841  BP: (!) 169/92 140/83  Pulse: 89   Resp: 14   Temp: 98.5 F (36.9 C)   TempSrc: Oral   SpO2: 97%   Weight: 235 lb (106.6 kg)         Assessment & Plan:   Indianna Boran is a 63 y.o. female Alkaline phosphatase elevation - Plan: Gamma GT, Hepatic Function Panel  -Repeat as last p.o. intake 3 and half hours ago.  Less likely postprandial elevation.  Check GGT, and still if significant elevation can decide on either bone cause with endocrinology eval versus initial right upper quadrant ultrasound to evaluate for liver cause.  Hyperlipidemia, unspecified hyperlipidemia type - Plan: atorvastatin (LIPITOR) 10 MG tablet  -Options for treatment discussed but with elevated ASCVD risk she did choose to start a statin.  Start Lipitor 10 mg daily, potential risks and side effects discussed, recheck 6 weeks with RTC precautions  Essential  hypertension - Plan: hydrochlorothiazide (MICROZIDE) 12.5 MG capsule  -Decreased control along with some symptoms of home indicating the likely elevated readings as well.  Add HCTZ 12.5 mg daily, continue amlodipine same dose.  Potential orthostatic or low blood pressures and plan discussed with recheck in 6 weeks.   Meds ordered this encounter  Medications  . hydrochlorothiazide (MICROZIDE) 12.5 MG capsule    Sig: Take 1 capsule (12.5 mg total) by mouth daily.    Dispense:  90 capsule    Refill:  1  . atorvastatin (LIPITOR) 10 MG tablet    Sig: Take 1 tablet (10 mg total) by mouth daily.    Dispense:  90 tablet    Refill:  0   Patient Instructions    Continue amlodipine 10 mg once per day, but also add the hydrochlorothiazide pill once per day for improved blood pressure control.  If that is too strong or you are getting lightheaded, dizzy or other new side effects that might indicate that your blood pressure is running too low and you can return to just the amlodipine if needed. Keep a record of your blood pressures outside of the office and let me know those readings.   Start lipitor once per day for cholesterol and can recheck in 6 weeks.  If new side effects from that medicine the me know.  I will check the alkaline phosphatase along with another blood test today to determine if further work-up needed.  Thank you for coming in today.  Return to  the clinic or go to the nearest emergency room if any of your symptoms worsen or new symptoms occur.    If you have lab work done today you will be contacted with your lab results within the next 2 weeks.  If you have not heard from [place] then please contact us. The fastest way to get your results is to register for My Chart.   IF you received an x-ray today, you will receive an invoice from [facility]. Please contact [facility] at [phone] with questions or concerns regarding your invoice.   IF you received labwork  today, you will receive an invoice from [facility]. Please contact [facility] at [phone] with questions or concerns regarding your invoice.   Our billing staff will not be able to assist you with questions regarding bills from these companies.  You will be contacted with the lab results as soon as they are available. The fastest way to get your results is to activate your My Chart account. Instructions are located on the last page of this paperwork. If you have not heard from [place] regarding the results in 2 weeks, please contact this office.    \  Signed,   Merri Ray, MD [facility].  07/20/19 9:29 AM

## 2019-07-20 NOTE — Telephone Encounter (Signed)
07/20/2019 - PATIENT SAW DR. Carlota Raspberry ON Thursday (07/20/2019). HE HAS REQUESTED SHE RETURN IN 6 WEEKS. I MADE HER APPOINTMENT FOR Thursday (08/31/2019) AT 8:00am. DR. Carlota Raspberry WANTS TO CHECK HER BLOOD PRESSURE AND CHOLESTEROL. I FOR GOT TO TELL HER TO BE FASTING THE NIGHT BEFORE FOR HER CHOLESTEROL. I LEFT THIS MESSAGE ON HER VOICE MAIL AND TOLD HER TO CALL ME BACK IF SHE HAS ANY QUESTIONS. [place]

## 2019-07-21 LAB — HEPATIC FUNCTION PANEL
ALT: 29 IU/L (ref 0–32)
AST: 33 IU/L (ref 0–40)
Albumin: 4.4 g/dL (ref 3.8–4.8)
Alkaline Phosphatase: 192 IU/L — ABNORMAL HIGH (ref 39–117)
Bilirubin Total: 0.4 mg/dL (ref 0.0–1.2)
Bilirubin, Direct: 0.11 mg/dL (ref 0.00–0.40)
Total Protein: 7.1 g/dL (ref 6.0–8.5)

## 2019-07-21 LAB — GAMMA GT: GGT: 45 IU/L (ref 0–60)

## 2019-07-30 ENCOUNTER — Other Ambulatory Visit: Payer: Self-pay | Admitting: Family Medicine

## 2019-07-30 DIAGNOSIS — R748 Abnormal levels of other serum enzymes: Secondary | ICD-10-CM

## 2019-08-24 ENCOUNTER — Other Ambulatory Visit: Payer: Self-pay

## 2019-08-28 ENCOUNTER — Encounter: Payer: Self-pay | Admitting: Internal Medicine

## 2019-08-28 ENCOUNTER — Other Ambulatory Visit: Payer: Self-pay

## 2019-08-28 ENCOUNTER — Ambulatory Visit (INDEPENDENT_AMBULATORY_CARE_PROVIDER_SITE_OTHER): Payer: 59 | Admitting: Internal Medicine

## 2019-08-28 VITALS — BP 166/100 | HR 96 | Ht 65.5 in | Wt 235.8 lb

## 2019-08-28 DIAGNOSIS — R7989 Other specified abnormal findings of blood chemistry: Secondary | ICD-10-CM

## 2019-08-28 DIAGNOSIS — R748 Abnormal levels of other serum enzymes: Secondary | ICD-10-CM | POA: Diagnosis not present

## 2019-08-28 NOTE — Progress Notes (Addendum)
Patient ID: Nicole Reyes, female   DOB: [birth year], 63 y.o.   MRN: [id number]   HPI  Nicole Reyes is a 63 y.o.-year-old female, referred by her PCP, Dr. Carlota Raspberry, for evaluation and management of high alkaline phosphatase.  Pt was observed to have a high alkaline phosphatase in 2013: Lab Results  Component Value Date   ALKPHOS 192 (H) 07/20/2019   ALKPHOS 176 (H) 06/29/2019   ALKPHOS 161 (H) 06/10/2018   ALKPHOS 129 (H) 02/12/2017   ALKPHOS 120 03/21/2016   ALKPHOS 107 10/06/2015   ALKPHOS 109 01/25/2015   ALKPHOS 114 04/09/2014   ALKPHOS 115 03/27/2013   ALKPHOS 145 (H) 06/06/2012   ALKPHOS 107 07/10/2010   ALKPHOS 104 07/20/2008   Her GGT and the rest of her LFTs were normal: Component     Latest Ref Rng & Units 07/20/2019  Total Protein     6.0 - 8.5 g/dL 7.1  Albumin     3.8 - 4.8 g/dL 4.4  Total Bilirubin     0.0 - 1.2 mg/dL 0.4  BILIRUBIN, DIRECT     0.00 - 0.40 mg/dL 0.11  Alkaline Phosphatase     39 - 117 IU/L 192 (H)  AST     0 - 40 IU/L 33  ALT     0 - 32 IU/L 29  GGT     0 - 60 IU/L 45   She also has a history of: - 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 She denies fractures or a diagnosis of osteoporosis.  No h/o vitamin D deficiency or osteomalacia. Reviewed available vit D levels: Lab Results  Component Value Date   VD25OH 35.5 06/10/2018   She takes calcium and vitamin D from [facility].  No weight bearing exercises.   No h/o hyper/hypocalcemia or hyperparathyroidism. No h/o kidney stones. Lab Results  Component Value Date   CALCIUM 9.4 06/29/2019   CALCIUM 9.0 09/01/2018   CALCIUM 9.4 08/25/2018   CALCIUM 9.3 06/10/2018   CALCIUM 9.6 02/12/2017   CALCIUM 9.0 03/21/2016   CALCIUM 9.2 10/06/2015   CALCIUM 9.3 01/25/2015   CALCIUM 9.6 04/09/2014   CALCIUM 9.7 03/27/2013   No results found for: PHOS    No h/o thyrotoxicosis. Reviewed TSH recent levels:  Lab Results  Component Value Date   TSH 0.996 06/29/2019   TSH 2.360 06/10/2018   TSH 1.220 02/12/2017   TSH 1.37 03/21/2016   TSH 1.379 01/25/2015   No h/o CKD. Last BUN/Cr: Lab Results  Component Value Date   BUN 16 06/29/2019   CREATININE 0.80 06/29/2019   No history of cancer, particularly osteosarcoma, or known bone metastases.  No history of diabetes: Lab Results  Component Value Date   HGBA1C 5.2 02/12/2017   No history of myeloid metaplasia. Latest CBC: Lab Results  Component Value Date   WBC 6.9 09/03/2018   HGB 12.4 09/03/2018   HCT 38.5 09/03/2018   MCV 82.1 09/03/2018   PLT 286 09/03/2018   She also has a history of hypertension.  ROS: Constitutional: + Weight gain, no weight loss, + fatigue, no subjective hyperthermia, no subjective hypothermia, + nocturia, + poor sleep Eyes: + Blurry vision, no xerophthalmia ENT: no sore throat, no nodules palpated in neck, no dysphagia, no odynophagia, no hoarseness, no tinnitus, no hypoacusis Cardiovascular: no CP, + SOB, no palpitations, + L leg swelling (left knee pain) Respiratory: no  cough, + SOB, no wheezing Gastrointestinal: no N, no V, no D, + C, no acid reflux Musculoskeletal: + muscle, + joint aches Skin: no rash, no hair loss Neurological: no tremors, no numbness or tingling/no dizziness/+ HAs Psychiatric: no depression, no anxiety + Low libido  I reviewed pt's medications, allergies, PMH, social hx, family hx, and changes were documented in the history of present illness. Otherwise, unchanged from my initial visit note.  Past Medical History:  Diagnosis Date  . Allergy   . Anxiety   . Arthritis   . Asthma   . Hypertension    Past Surgical History:  Procedure Laterality Date  . COLONOSCOPY  06/29/2011   Normal  . COLONOSCOPY&ENDOSCOPY  1980's   PT UNSURE PLACE&MD WHO DID EXAMS=NORMAL  . DILATION AND CURETTAGE OF UTERUS    . LAPAROSCOPY      REMOVED SCARE TISSUE FROM OVARIES  . TOTAL KNEE ARTHROPLASTY Right 08/31/2018   Procedure: RIGHT  TOTAL KNEE ARTHROPLASTY;  Surgeon: Susa Day, MD;  Location: [facility];  Service: Orthopedics;  Laterality: Right;   Social History   Socioeconomic History  . Marital status: Single    Spouse name: n/a  . Number of children: 0  . Years of education: Not on file  . Highest education level: Not on file  Occupational History  . Occupation:  Heritage manager:   Social Needs  . Financial resource strain: Not on file  . Food insecurity    Worry: Not on file    Inability: Not on file  . Transportation needs    Medical: Not on file    Non-medical: Not on file  Tobacco Use  . Smoking status: Never Smoker  . Smokeless tobacco: Never Used  Substance and Sexual Activity  . Alcohol use: No    Alcohol/week: 0.0 standard drinks  . Drug use: No   Current Outpatient Medications on File Prior to Visit  Medication Sig Dispense Refill  . albuterol (PROAIR HFA) 108 (90 Base) MCG/ACT inhaler TAKE 2 PUFFS BY MOUTH EVERY 6 HOURS AS NEEDED FOR WHEEZE 8.5 Inhaler 1  . amLODipine (NORVASC) 10 MG tablet Take 1 tablet (10 mg total) by mouth daily. 90 tablet 1  . aspirin EC 325 MG tablet Take 1 tablet (325 mg total) by mouth daily. 60 tablet 1  . atorvastatin (LIPITOR) 10 MG tablet Take 1 tablet (10 mg total) by mouth daily. 90 tablet 0  . budesonide-formoterol (SYMBICORT) 80-4.5 MCG/ACT inhaler Inhale 2 puffs into the lungs 2 (two) times daily. 10.2 Inhaler 8  . cetirizine (ZYRTEC) 10 MG tablet Take 1 tablet (10 mg total) by mouth daily. 90 tablet 1  . hydrochlorothiazide (MICROZIDE) 12.5 MG capsule Take 1 capsule (12.5 mg total) by mouth daily. 90 capsule 1  . montelukast (SINGULAIR) 10 MG tablet Take 1 tablet (10 mg total) by mouth at bedtime. 90 tablet 1  . Olopatadine HCl (PATADAY) 0.2 % SOLN Place 1 drop into both eyes daily as needed. 2.5 mL 5   No current facility-administered medications on  file prior to visit.    Allergies  Allergen Reactions  . Shellfish Allergy Anaphylaxis  . Peanut-Containing Drug Products     Also walnuts and almonds    Family History  Problem Relation Age of Onset  . Alcohol abuse Father   . Heart disease Father 31       AMI age 34  . Stroke Father 87       CVA  . Hypertension  Mother   . Asthma Mother   . Hypertension Sister   . Hypertension Brother   . Hyperlipidemia Brother   . Hypertension Sister   . Hypertension Sister   . Hypertension Sister   . Hypertension Brother   . Colon cancer Neg Hx   Also, diabetes in sister, thyroid disease in mother, hyperlipidemia in brother, sisters  PE: BP (!) 166/100 (BP Location: Right Arm, Patient Position: Sitting, Cuff Size: Normal)   Pulse 96   Ht 5' 5.5" (1.664 m)   Wt 235 lb 12.8 oz (107 kg)   SpO2 100%   BMI 38.64 kg/m  Wt Readings from Last 3 Encounters:  08/28/19 235 lb 12.8 oz (107 kg)  07/20/19 235 lb (106.6 kg)  06/29/19 229 lb 9.6 oz (104.1 kg)   Constitutional: obese, in NAD.  Eyes: PERRLA, EOMI, no exophthalmos ENT: moist mucous membranes, no thyromegaly, no cervical lymphadenopathy Cardiovascular: RRR, No MRG Respiratory: CTA B Gastrointestinal: abdomen soft, NT, ND, BS+ Musculoskeletal: no deformities, strength intact in all 4 Skin: moist, warm, no rashes Neurological: no tremor with outstretched hands, DTR normal in all 4  Assessment: 1. High alkaline phosphatase  Plan: 1. High alkaline phosphatase -At this visit, I reviewed her alkaline phosphatase levels along with the patient.  I explained that they are above the normal range, and increasing.  -We reviewed alkaline phosphatase liver and bone etiologies.  Since her gamma-glutamyl transferase and the rest of her LFTs were normal, the most likely etiology is from bone.  I do not feel that we need speciation for this.  Also, the BSAP assay may have up to 20% cross-reactivity with total alkaline phosphatase. -Physiologic  causes of elevated alkaline phosphatase are: After fatty meals (however, this is most likely due to the liver alkaline phosphatase fraction), after fracture, in pregnancy, during growth, but these are not pertinent to the patient.  Other possible causes are:   Vitamin D deficiency and associated osteomalacia (her vitamin D level was normal last year)  osteogenic sarcoma/bone mets or other cancers (no known cancer, no bone pain)  hyperparathyroidism  (calcium levels were normal)  Hyperthyroidism (thyroid tests are normal)  Uncontrolled diabetes (no history of this)  Myeloid metaplasia (no history of this)  Paget's disease -The majority of the above etiologies have been ruled out by previous work-up, which leaves [place] with a diagnosis of Paget's disease. -We discussed about what this entails: This is a benign disorder of bone which affects 1 or more bones and is due to increased osteoclast activity with rapid, disordered, osteoblast-induced bone formation -Possible complications from Paget disease can be increased pain in the affected bone, instability of the joint near the lesion, fractures, hearing loss if Paget affects the skull, and, very rarely CHF and osteosarcoma -Treatment is usually with bisphosphonates but not necessarily in mild, asymptomatic cases -Markers of bone formation and resorption can further be checked, however, in her case, with normal LFTs, I have no doubt that her excess alkaline phosphatase has a skeletal origin -For now, I suggested to proceed with a bone scan to identify possible Paget's sites -If no lesions are found, we will continue to follow her conservatively with yearly alkaline phosphatase levels, but I would like to see her back in 6 months to recheck this.  We will check her vitamin D level then, also. -If there are lesions, depending on the locations, we may need to treat them with zoledronic acid, which is the preferred bisphosphonate in this case. -RTC in 6  mo but we will be in touch regarding her bone scan results  Orders Placed This Encounter  Procedures  . NM Bone Scan Whole Body   Narrative & Impression    CLINICAL DATA:  Elevated alkaline phosphatase, whole body aches, disorder of bone development and growth, prior RIGHT TKR 08/31/2018, LEFT ankle swelling, asthma, hypertension  EXAM: NUCLEAR MEDICINE WHOLE BODY BONE SCAN  TECHNIQUE: Whole body anterior and posterior images were obtained approximately 3 hours after intravenous injection of radiopharmaceutical.  RADIOPHARMACEUTICALS:  21.3 mCi Technetium-28m MDP IV  COMPARISON:  None  Radiographic correlation: None recent; chest radiograph 01/24/2013  FINDINGS: Uptake at the shoulders, elbows, wrists, ankles, and LEFT knee, typically degenerative.  Photopenic defect at RIGHT knee from knee prosthesis.  Mild uptake of tracer adjacent to the tibial component of the prosthesis, nonspecific.  Questionable subtle uptake within the midthoracic spine at approximately the approximately T6-T7, corresponding to degenerative disc disease changes on chest radiograph.  No additional abnormal osseous tracer accumulation identified.  Probable narrow angle scatter at several lower anterior ribs.  Expected urinary tract and soft tissue distribution of tracer.  IMPRESSION: Scattered degenerative type uptake as above.  RIGHT knee prosthesis with minimal adjacent nonspecific tracer uptake.  No other definite scintigraphic abnormalities.   Electronically Signed   By: Lavonia Dana M.D.   On: 09/15/2019 15:57    No signs of skeletal Paget's disease.  Philemon Kingdom, MD PhD [facility] End[facility]

## 2019-08-28 NOTE — Patient Instructions (Signed)
I ordered a bone scan at [facility]. If you are not called in ~1 week, please let me know.  Please come back in 6 months.

## 2019-08-31 ENCOUNTER — Ambulatory Visit: Payer: 59 | Admitting: Family Medicine

## 2019-09-15 ENCOUNTER — Other Ambulatory Visit: Payer: Self-pay

## 2019-09-15 ENCOUNTER — Encounter (HOSPITAL_COMMUNITY)
Admission: RE | Admit: 2019-09-15 | Discharge: 2019-09-15 | Disposition: A | Discharge status: Home / Self Care | Payer: 59 | Source: Ambulatory Visit | Attending: Internal Medicine | Admitting: Internal Medicine

## 2019-09-15 DIAGNOSIS — R748 Abnormal levels of other serum enzymes: Secondary | ICD-10-CM | POA: Diagnosis not present

## 2019-09-15 MED ORDER — TECHNETIUM TC 99M MEDRONATE IV KIT
20.0000 | PACK | Freq: Once | INTRAVENOUS | Status: AC | PRN
  Administered 2019-09-15: 20 via INTRAVENOUS

## 2019-09-29 ENCOUNTER — Ambulatory Visit: Payer: 59 | Admitting: Family Medicine

## 2019-09-29 ENCOUNTER — Other Ambulatory Visit: Payer: Self-pay

## 2019-09-29 ENCOUNTER — Encounter: Payer: Self-pay | Admitting: Family Medicine

## 2019-09-29 VITALS — BP 140/79 | HR 85 | Temp 98.5°F | Wt 237.6 lb

## 2019-09-29 DIAGNOSIS — Z889 Allergy status to unspecified drugs, medicaments and biological substances status: Secondary | ICD-10-CM

## 2019-09-29 DIAGNOSIS — I10 Essential (primary) hypertension: Secondary | ICD-10-CM

## 2019-09-29 DIAGNOSIS — R748 Abnormal levels of other serum enzymes: Secondary | ICD-10-CM

## 2019-09-29 DIAGNOSIS — E785 Hyperlipidemia, unspecified: Secondary | ICD-10-CM

## 2019-09-29 DIAGNOSIS — J45909 Unspecified asthma, uncomplicated: Secondary | ICD-10-CM

## 2019-09-29 DIAGNOSIS — Z9189 Other specified personal risk factors, not elsewhere classified: Secondary | ICD-10-CM

## 2019-09-29 DIAGNOSIS — J302 Other seasonal allergic rhinitis: Secondary | ICD-10-CM

## 2019-09-29 DIAGNOSIS — L500 Allergic urticaria: Secondary | ICD-10-CM

## 2019-09-29 MED ORDER — ATORVASTATIN CALCIUM 10 MG PO TABS: 10.0000 mg | ORAL_TABLET | Freq: Every day | ORAL | 0 refills | Status: DC

## 2019-09-29 MED ORDER — HYDROCHLOROTHIAZIDE 12.5 MG PO CAPS: 12.5000 mg | ORAL_CAPSULE | Freq: Every day | ORAL | 1 refills | Status: DC

## 2019-09-29 MED ORDER — BUDESONIDE-FORMOTEROL FUMARATE 80-4.5 MCG/ACT IN AERO: 2.0000 | INHALATION_SPRAY | Freq: Two times a day (BID) | RESPIRATORY_TRACT | 8 refills | Status: DC

## 2019-09-29 MED ORDER — OLOPATADINE HCL 0.2 % OP SOLN: 1.0000 [drp] | Freq: Every day | OPHTHALMIC | 5 refills | Status: DC | PRN

## 2019-09-29 MED ORDER — CETIRIZINE HCL 10 MG PO TABS: 10.0000 mg | ORAL_TABLET | Freq: Every day | ORAL | 1 refills | Status: DC

## 2019-09-29 MED ORDER — AMLODIPINE BESYLATE 10 MG PO TABS: 10.0000 mg | ORAL_TABLET | Freq: Every day | ORAL | 1 refills | Status: DC

## 2019-09-29 MED ORDER — MONTELUKAST SODIUM 10 MG PO TABS: 10.0000 mg | ORAL_TABLET | Freq: Every day | ORAL | 1 refills | Status: DC

## 2019-09-29 NOTE — Progress Notes (Signed)
Subjective:    Patient ID: Geana Lella, female    DOB: 08-10-1956, 63 y.o.   MRN: [id number]  HPI Solace Ellifritz is a 63 y.o. female Presents today for: Chief Complaint  Patient presents with   chronic medical conditions    6 Week f/u on bp and cholesterol   Hypertension: BP Readings from Last 3 Encounters:  09/29/19 140/79  08/28/19 (!) 166/100  07/20/19 140/83   Lab Results  Component Value Date   CREATININE 0.80 06/29/2019  Norvasc 10 mg daily, HCTZ 12.5 mg daily.  Added HCTZ in August due to elevated readings. Home readings:  124-142/79-97 No new side effects. Working on diet.  Walking for exercise.    Hyperlipidemia:  Lab Results  Component Value Date   CHOL 225 (H) 06/29/2019   HDL 53 06/29/2019   LDLCALC 155 (H) 06/29/2019   TRIG 83 06/29/2019   CHOLHDL 4.2 06/29/2019   Lab Results  Component Value Date   ALT 29 07/20/2019   AST 33 07/20/2019   GGT 45 07/20/2019   ALKPHOS 192 (H) 07/20/2019   BILITOT 0.4 07/20/2019  Lipitor 10 mg daily, started August 6. No new side effects/myalgias.   Allergic rhinitis: Singulair 10 mg daily, Zyrtec 10 mg daily,  Pataday drops use in the past.  Asthma: Treated with Symbicort 80/4.52 puffs twice daily, albuterol as needed No recent flair. Doing well, last used albuterol 4 weeks.   Elevated alkaline phosphatase: Elevated at 176 in July, repeat testing in August increased to 192, and that testing has been done a few hours after eating.  GGT normal.  Thought to be bone cause.  Referred to endocrinology.  Evaluated September 16 with Dr. Cruzita Lederer.  Possible Paget's, bone scan obtained without signs of Paget's disease lesions.   Plan for yearly alkaline phosphatase levels, 58-month follow-up.    Patient Active Problem List   Diagnosis Date Noted   Acute otitis media 01/08/2019   Sore throat 01/08/2019   Osteoarthritis of right knee 08/31/2018   Right knee DJD 08/31/2018   Allergic reaction 05/23/2018    Allergic urticaria 05/23/2018   Perennial and seasonal allergic rhinitis 05/23/2018   Allergic conjunctivitis 05/23/2018   Food allergy 05/23/2018   Carpal tunnel syndrome of left wrist 01/25/2018   Lattice degeneration 11/20/2017   Dry eye syndrome 11/20/2017   Cataracts, bilateral 11/20/2017   Chronic right SI joint pain 01/28/2016   Nut allergy 01/28/2016   Obesity 01/25/2015   Pure hypercholesterolemia 02/10/2014   Moderate persistent asthma 12/19/2012   Hypertensive disorder 12/19/2012   Past Medical History:  Diagnosis Date   Allergy    Anxiety    Arthritis    Asthma    Hypertension    Past Surgical History:  Procedure Laterality Date   COLONOSCOPY  06/29/2011   Normal   COLONOSCOPY&ENDOSCOPY  1980's   PT UNSURE PLACE&MD WHO DID EXAMS=NORMAL   DILATION AND CURETTAGE OF UTERUS     LAPAROSCOPY     REMOVED SCARE TISSUE FROM OVARIES   TOTAL KNEE ARTHROPLASTY Right 08/31/2018   Procedure: RIGHT  TOTAL KNEE ARTHROPLASTY;  Surgeon: Susa Day, MD;  Location: [facility];  Service: Orthopedics;  Laterality: Right;   Allergies  Allergen Reactions   Shellfish Allergy Anaphylaxis   Peanut-Containing Drug Products     Also walnuts and almonds    Prior to Admission medications   Medication Sig Start Date End Date Taking? Authorizing Provider  albuterol (PROAIR HFA) 108 (90 Base) MCG/ACT inhaler TAKE 2 PUFFS  BY MOUTH EVERY 6 HOURS AS NEEDED FOR WHEEZE 04/10/19   McVey, Gelene Mink, PA-C  amLODipine (NORVASC) 10 MG tablet Take 1 tablet (10 mg total) by mouth daily. 06/29/19   Wendie Agreste, MD  atorvastatin (LIPITOR) 10 MG tablet Take 1 tablet (10 mg total) by mouth daily. 07/20/19   Wendie Agreste, MD  budesonide-formoterol [facility]) 80-4.5 MCG/ACT inhaler Inhale 2 puffs into the lungs 2 (two) times daily. 06/29/19   Wendie Agreste, MD  cetirizine (ZYRTEC) 10 MG tablet Take 1 tablet (10 mg total) by mouth daily. 06/29/19   Wendie Agreste, MD  hydrochlorothiazide (MICROZIDE) 12.5 MG capsule Take 1 capsule (12.5 mg total) by mouth daily. 07/20/19   Wendie Agreste, MD  montelukast (SINGULAIR) 10 MG tablet Take 1 tablet (10 mg total) by mouth at bedtime. 06/29/19   Wendie Agreste, MD  Olopatadine HCl (PATADAY) 0.2 % SOLN Place 1 drop into both eyes daily as needed. 06/29/19   Wendie Agreste, MD   Social History   Socioeconomic History   Marital status: Single    Spouse name: n/a   Number of children: 0   Years of education: Not on file   Highest education level: Not on file  Occupational History   Occupation: employed    Employer: [employer]  Social Designer, fashion/clothing strain: Not on file   Food insecurity    Worry: Not on file    Inability: Not on file   Transportation needs    Medical: Not on file    Non-medical: Not on file  Tobacco Use   Smoking status: Never Smoker   Smokeless tobacco: Never Used  Substance and Sexual Activity   Alcohol use: No    Alcohol/week: 0.0 standard drinks   Drug use: No   Sexual activity: Yes    Birth control/protection: Post-menopausal  Lifestyle   Physical activity    Days per week: Not on file    Minutes per session: Not on file   Stress: Not on file  Relationships   Social connections    Talks on phone: Not on file    Gets together: Not on file    Attends religious service: Not on file    Active member of club or organization: Not on file    Attends meetings of clubs or organizations: Not on file    Relationship status: Not on file   Intimate partner violence    Fear of current or ex partner: Not on file    Emotionally abused: Not on file    Physically abused: Not on file    Forced sexual activity: Not on file  Other Topics Concern   Not on file  Social History Narrative   Marital status:  Single; not dating      Children: none      Lives: alone      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      Tobacco: none      Alcohol:  none      Exercise:  Four days per week.    Review of Systems  Constitutional: Negative for fatigue and unexpected weight change.  Respiratory: Negative for chest tightness and shortness of breath.   Cardiovascular: Negative for chest pain, palpitations and leg swelling.  Gastrointestinal: Negative for abdominal pain and blood in stool.  Neurological: Negative for dizziness, syncope, light-headedness and headaches.       Objective:   Physical Exam Vitals signs  reviewed.  Constitutional:      Appearance: She is well-developed.  HENT:     Head: Normocephalic and atraumatic.  Eyes:     Conjunctiva/sclera: Conjunctivae normal.     Pupils: Pupils are equal, round, and reactive to light.  Neck:     Vascular: No carotid bruit.  Cardiovascular:     Rate and Rhythm: Normal rate and regular rhythm.     Heart sounds: Normal heart sounds.  Pulmonary:     Effort: Pulmonary effort is normal.     Breath sounds: Normal breath sounds.  Abdominal:     Palpations: Abdomen is soft. There is no pulsatile mass.     Tenderness: There is no abdominal tenderness.  Skin:    General: Skin is warm and dry.  Neurological:     Mental Status: She is alert and oriented to person, place, and time.  Psychiatric:        Behavior: Behavior normal.    Vitals:   09/29/19 1002  BP: 140/79  Pulse: 85  Temp: 98.5 F (36.9 C)  TempSrc: Oral  SpO2: 100%  Weight: 237 lb 9.6 oz (107.8 kg)      Assessment & Plan:    Everlynn Swager is a 63 y.o. female Uncomplicated asthma, unspecified asthma severity, unspecified whether persistent Extrinsic asthma without complication, unspecified asthma severity, unspecified whether persistent - Plan: budesonide-formoterol (SYMBICORT) 80-4.5 MCG/ACT inhaler  -Stable control, continue same regimen  Alkaline phosphatase elevation - Plan: Comprehensive metabolic panel  -Followed by endocrinology, possible Paget's but no sign of uptake on bone scan.  Continue  routine follow-up  Hyperlipidemia, unspecified hyperlipidemia type - Plan: Comprehensive metabolic panel, Lipid Panel, atorvastatin (LIPITOR) 10 MG tablet  -Tolerating Lipitor, continue same.  Repeat labs  Essential hypertension - Plan: Comprehensive metabolic panel, amLODipine (NORVASC) 10 MG tablet, hydrochlorothiazide (MICROZIDE) 12.5 MG capsule  -Borderline, variable home readings.  Has been working on exercise and diet changes.  Continue same regimen for now with RTC precautions, handout given  Seasonal allergies - Plan: Olopatadine HCl (PATADAY) 0.2 % SOLN, montelukast (SINGULAIR) 10 MG tablet H/O multiple allergies - Plan: cetirizine (ZYRTEC) 10 MG tablet Allergic urticaria - Plan: cetirizine (ZYRTEC) 10 MG tablet  -Stable, continue same   Meds ordered this encounter  Medications   Olopatadine HCl (PATADAY) 0.2 % SOLN    Sig: Place 1 drop into both eyes daily as needed.    Dispense:  2.5 mL    Refill:  5   amLODipine (NORVASC) 10 MG tablet    Sig: Take 1 tablet (10 mg total) by mouth daily.    Dispense:  90 tablet    Refill:  1   atorvastatin (LIPITOR) 10 MG tablet    Sig: Take 1 tablet (10 mg total) by mouth daily.    Dispense:  90 tablet    Refill:  0   cetirizine (ZYRTEC) 10 MG tablet    Sig: Take 1 tablet (10 mg total) by mouth daily.    Dispense:  90 tablet    Refill:  1   montelukast (SINGULAIR) 10 MG tablet    Sig: Take 1 tablet (10 mg total) by mouth at bedtime.    Dispense:  90 tablet    Refill:  1   hydrochlorothiazide (MICROZIDE) 12.5 MG capsule    Sig: Take 1 capsule (12.5 mg total) by mouth daily.    Dispense:  90 capsule    Refill:  1   budesonide-formoterol (SYMBICORT) 80-4.5 MCG/ACT inhaler    Sig:  Inhale 2 puffs into the lungs 2 (two) times daily.    Dispense:  10.2 Inhaler    Refill:  8   Patient Instructions   No med changes at this time.  Keep up the good work with exercise and watching diet.  See information below on diet but  specifically limit sodium.  Monitor home blood pressure readings and if you continue to have readings in the 140s over 90s in the next few weeks, would recommend increasing medication.  Otherwise follow-up in 6 months.  Take care.    Hypertension, Adult High blood pressure (hypertension) is when the force of blood pumping through the arteries is too strong. The arteries are the blood vessels that carry blood from the heart throughout the body. Hypertension forces the heart to work harder to pump blood and may cause arteries to become narrow or stiff. Untreated or uncontrolled hypertension can cause a heart attack, heart failure, a stroke, kidney disease, and other problems. A blood pressure reading consists of a higher number over a lower number. Ideally, your blood pressure should be below 120/80. The first ("top") number is called the systolic pressure. It is a measure of the pressure in your arteries as your heart beats. The second ("bottom") number is called the diastolic pressure. It is a measure of the pressure in your arteries as the heart relaxes. What are the causes? The exact cause of this condition is not known. There are some conditions that result in or are related to high blood pressure. What increases the risk? Some risk factors for high blood pressure are under your control. The following factors may make you more likely to develop this condition:  Smoking.  Having type 2 diabetes mellitus, high cholesterol, or both.  Not getting enough exercise or physical activity.  Being overweight.  Having too much fat, sugar, calories, or salt (sodium) in your diet.  Drinking too much alcohol. Some risk factors for high blood pressure may be difficult or impossible to change. Some of these factors include:  Having chronic kidney disease.  Having a family history of high blood pressure.  Age. Risk increases with age.  Race. You may be at higher risk if you are African  American.  Gender. Men are at higher risk than women before age 67. After age 63, women are at higher risk than men.  Having obstructive sleep apnea.  Stress. What are the signs or symptoms? High blood pressure may not cause symptoms. Very high blood pressure (hypertensive crisis) may cause:  Headache.  Anxiety.  Shortness of breath.  Nosebleed.  Nausea and vomiting.  Vision changes.  Severe chest pain.  Seizures. How is this diagnosed? This condition is diagnosed by measuring your blood pressure while you are seated, with your arm resting on a flat surface, your legs uncrossed, and your feet flat on the floor. The cuff of the blood pressure monitor will be placed directly against the skin of your upper arm at the level of your heart. It should be measured at least twice using the same arm. Certain conditions can cause a difference in blood pressure between your right and left arms. Certain factors can cause blood pressure readings to be lower or higher than normal for a short period of time:  When your blood pressure is higher when you are in a health care provider's office than when you are at home, this is called white coat hypertension. Most people with this condition do not need medicines.  When your  blood pressure is higher at home than when you are in a health care provider's office, this is called masked hypertension. Most people with this condition may need medicines to control blood pressure. If you have a high blood pressure reading during one visit or you have normal blood pressure with other risk factors, you may be asked to:  Return on a different day to have your blood pressure checked again.  Monitor your blood pressure at home for 1 week or longer. If you are diagnosed with hypertension, you may have other blood or imaging tests to help your health care provider understand your overall risk for other conditions. How is this treated? This condition is treated by  making healthy lifestyle changes, such as eating healthy foods, exercising more, and reducing your alcohol intake. Your health care provider may prescribe medicine if lifestyle changes are not enough to get your blood pressure under control, and if:  Your systolic blood pressure is above 130.  Your diastolic blood pressure is above 80. Your personal target blood pressure may vary depending on your medical conditions, your age, and other factors. Follow these instructions at home: Eating and drinking   Eat a diet that is high in fiber and potassium, and low in sodium, added sugar, and fat. An example eating plan is called the DASH (Dietary Approaches to Stop Hypertension) diet. To eat this way: ? Eat plenty of fresh fruits and vegetables. Try to fill one half of your plate at each meal with fruits and vegetables. ? Eat whole grains, such as whole-wheat pasta, brown rice, or whole-grain bread. Fill about one fourth of your plate with whole grains. ? Eat or drink low-fat dairy products, such as skim milk or low-fat yogurt. ? Avoid fatty cuts of meat, processed or cured meats, and poultry with skin. Fill about one fourth of your plate with lean proteins, such as fish, chicken without skin, beans, eggs, or tofu. ? Avoid pre-made and processed foods. These tend to be higher in sodium, added sugar, and fat.  Reduce your daily sodium intake. Most people with hypertension should eat less than 1,500 mg of sodium a day.  Do not drink alcohol if: ? Your health care provider tells you not to drink. ? You are pregnant, may be pregnant, or are planning to become pregnant.  If you drink alcohol: ? Limit how much you use to:  0-1 drink a day for women.  0-2 drinks a day for men. ? Be aware of how much alcohol is in your drink. In the [place] one drink equals one 12 oz bottle of beer (355 mL), one 5 oz glass of wine (148 mL), or one 1 oz glass of hard liquor (44 mL). Lifestyle   Work with your health  care provider to maintain a healthy body weight or to lose weight. Ask what an ideal weight is for you.  Get at least 30 minutes of exercise most days of the week. Activities may include walking, swimming, or biking.  Include exercise to strengthen your muscles (resistance exercise), such as Pilates or lifting weights, as part of your weekly exercise routine. Try to do these types of exercises for 30 minutes at least 3 days a week.  Do not use any products that contain nicotine or tobacco, such as cigarettes, e-cigarettes, and chewing tobacco. If you need help quitting, ask your health care provider.  Monitor your blood pressure at home as told by your health care provider.  Keep all follow-up visits  as told by your health care provider. This is important. Medicines  Take over-the-counter and prescription medicines only as told by your health care provider. Follow directions carefully. Blood pressure medicines must be taken as prescribed.  Do not skip doses of blood pressure medicine. Doing this puts you at risk for problems and can make the medicine less effective.  Ask your health care provider about side effects or reactions to medicines that you should watch for. Contact a health care provider if you:  Think you are having a reaction to a medicine you are taking.  Have headaches that keep coming back (recurring).  Feel dizzy.  Have swelling in your ankles.  Have trouble with your vision. Get help right away if you:  Develop a severe headache or confusion.  Have unusual weakness or numbness.  Feel faint.  Have severe pain in your chest or abdomen.  Vomit repeatedly.  Have trouble breathing. Summary  Hypertension is when the force of blood pumping through your arteries is too strong. If this condition is not controlled, it may put you at risk for serious complications.  Your personal target blood pressure may vary depending on your medical conditions, your age, and  other factors. For most people, a normal blood pressure is less than 120/80.  Hypertension is treated with lifestyle changes, medicines, or a combination of both. Lifestyle changes include losing weight, eating a healthy, low-sodium diet, exercising more, and limiting alcohol. This information is not intended to replace advice given to you by your health care provider. Make sure you discuss any questions you have with your health care provider. Document Released: 11/30/2005 Document Revised: 08/10/2018 Document Reviewed: 08/10/2018 Elsevier Patient Education  [employer].     If you have lab work done today you will be contacted with your lab results within the next 2 weeks.  If you have not heard from [place] then please contact us. The fastest way to get your results is to register for My Chart.   IF you received an x-ray today, you will receive an invoice from [facility]. Please contact [facility] at [phone] with questions or concerns regarding your invoice.   IF you received labwork today, you will receive an invoice from [facility]. Please contact [facility] at [phone] with questions or concerns regarding your invoice.   Our billing staff will not be able to assist you with questions regarding bills from these companies.  You will be contacted with the lab results as soon as they are available. The fastest way to get your results is to activate your My Chart account. Instructions are located on the last page of this paperwork. If you have not heard from [place] regarding the results in 2 weeks, please contact this office.       Signed,   Merri Ray, MD [facility].  09/29/19 10:37 AM

## 2019-09-29 NOTE — Patient Instructions (Addendum)
No med changes at this time.  Keep up the good work with exercise and watching diet.  See information below on diet but specifically limit sodium.  Monitor home blood pressure readings and if you continue to have readings in the 140s over 90s in the next few weeks, would recommend increasing medication.  Otherwise follow-up in 6 months.  Take care.    Hypertension, Adult High blood pressure (hypertension) is when the force of blood pumping through the arteries is too strong. The arteries are the blood vessels that carry blood from the heart throughout the body. Hypertension forces the heart to work harder to pump blood and may cause arteries to become narrow or stiff. Untreated or uncontrolled hypertension can cause a heart attack, heart failure, a stroke, kidney disease, and other problems. A blood pressure reading consists of a higher number over a lower number. Ideally, your blood pressure should be below 120/80. The first ("top") number is called the systolic pressure. It is a measure of the pressure in your arteries as your heart beats. The second ("bottom") number is called the diastolic pressure. It is a measure of the pressure in your arteries as the heart relaxes. What are the causes? The exact cause of this condition is not known. There are some conditions that result in or are related to high blood pressure. What increases the risk? Some risk factors for high blood pressure are under your control. The following factors may make you more likely to develop this condition:  Smoking.  Having type 2 diabetes mellitus, high cholesterol, or both.  Not getting enough exercise or physical activity.  Being overweight.  Having too much fat, sugar, calories, or salt (sodium) in your diet.  Drinking too much alcohol. Some risk factors for high blood pressure may be difficult or impossible to change. Some of these factors include:  Having chronic kidney disease.  Having a family history of  high blood pressure.  Age. Risk increases with age.  Race. You may be at higher risk if you are African American.  Gender. Men are at higher risk than women before age 52. After age 57, women are at higher risk than men.  Having obstructive sleep apnea.  Stress. What are the signs or symptoms? High blood pressure may not cause symptoms. Very high blood pressure (hypertensive crisis) may cause:  Headache.  Anxiety.  Shortness of breath.  Nosebleed.  Nausea and vomiting.  Vision changes.  Severe chest pain.  Seizures. How is this diagnosed? This condition is diagnosed by measuring your blood pressure while you are seated, with your arm resting on a flat surface, your legs uncrossed, and your feet flat on the floor. The cuff of the blood pressure monitor will be placed directly against the skin of your upper arm at the level of your heart. It should be measured at least twice using the same arm. Certain conditions can cause a difference in blood pressure between your right and left arms. Certain factors can cause blood pressure readings to be lower or higher than normal for a short period of time:  When your blood pressure is higher when you are in a health care provider's office than when you are at home, this is called white coat hypertension. Most people with this condition do not need medicines.  When your blood pressure is higher at home than when you are in a health care provider's office, this is called masked hypertension. Most people with this condition may need medicines to  control blood pressure. If you have a high blood pressure reading during one visit or you have normal blood pressure with other risk factors, you may be asked to:  Return on a different day to have your blood pressure checked again.  Monitor your blood pressure at home for 1 week or longer. If you are diagnosed with hypertension, you may have other blood or imaging tests to help your health care  provider understand your overall risk for other conditions. How is this treated? This condition is treated by making healthy lifestyle changes, such as eating healthy foods, exercising more, and reducing your alcohol intake. Your health care provider may prescribe medicine if lifestyle changes are not enough to get your blood pressure under control, and if:  Your systolic blood pressure is above 130.  Your diastolic blood pressure is above 80. Your personal target blood pressure may vary depending on your medical conditions, your age, and other factors. Follow these instructions at home: Eating and drinking   Eat a diet that is high in fiber and potassium, and low in sodium, added sugar, and fat. An example eating plan is called the DASH (Dietary Approaches to Stop Hypertension) diet. To eat this way: ? Eat plenty of fresh fruits and vegetables. Try to fill one half of your plate at each meal with fruits and vegetables. ? Eat whole grains, such as whole-wheat pasta, brown rice, or whole-grain bread. Fill about one fourth of your plate with whole grains. ? Eat or drink low-fat dairy products, such as skim milk or low-fat yogurt. ? Avoid fatty cuts of meat, processed or cured meats, and poultry with skin. Fill about one fourth of your plate with lean proteins, such as fish, chicken without skin, beans, eggs, or tofu. ? Avoid pre-made and processed foods. These tend to be higher in sodium, added sugar, and fat.  Reduce your daily sodium intake. Most people with hypertension should eat less than 1,500 mg of sodium a day.  Do not drink alcohol if: ? Your health care provider tells you not to drink. ? You are pregnant, may be pregnant, or are planning to become pregnant.  If you drink alcohol: ? Limit how much you use to:  0-1 drink a day for women.  0-2 drinks a day for men. ? Be aware of how much alcohol is in your drink. In the [place] one drink equals one 12 oz bottle of beer (355 mL), one  5 oz glass of wine (148 mL), or one 1 oz glass of hard liquor (44 mL). Lifestyle   Work with your health care provider to maintain a healthy body weight or to lose weight. Ask what an ideal weight is for you.  Get at least 30 minutes of exercise most days of the week. Activities may include walking, swimming, or biking.  Include exercise to strengthen your muscles (resistance exercise), such as Pilates or lifting weights, as part of your weekly exercise routine. Try to do these types of exercises for 30 minutes at least 3 days a week.  Do not use any products that contain nicotine or tobacco, such as cigarettes, e-cigarettes, and chewing tobacco. If you need help quitting, ask your health care provider.  Monitor your blood pressure at home as told by your health care provider.  Keep all follow-up visits as told by your health care provider. This is important. Medicines  Take over-the-counter and prescription medicines only as told by your health care provider. Follow directions carefully. Blood pressure  medicines must be taken as prescribed.  Do not skip doses of blood pressure medicine. Doing this puts you at risk for problems and can make the medicine less effective.  Ask your health care provider about side effects or reactions to medicines that you should watch for. Contact a health care provider if you:  Think you are having a reaction to a medicine you are taking.  Have headaches that keep coming back (recurring).  Feel dizzy.  Have swelling in your ankles.  Have trouble with your vision. Get help right away if you:  Develop a severe headache or confusion.  Have unusual weakness or numbness.  Feel faint.  Have severe pain in your chest or abdomen.  Vomit repeatedly.  Have trouble breathing. Summary  Hypertension is when the force of blood pumping through your arteries is too strong. If this condition is not controlled, it may put you at risk for serious  complications.  Your personal target blood pressure may vary depending on your medical conditions, your age, and other factors. For most people, a normal blood pressure is less than 120/80.  Hypertension is treated with lifestyle changes, medicines, or a combination of both. Lifestyle changes include losing weight, eating a healthy, low-sodium diet, exercising more, and limiting alcohol. This information is not intended to replace advice given to you by your health care provider. Make sure you discuss any questions you have with your health care provider. Document Released: 11/30/2005 Document Revised: 08/10/2018 Document Reviewed: 08/10/2018 Elsevier Patient Education  [employer].     If you have lab work done today you will be contacted with your lab results within the next 2 weeks.  If you have not heard from [place] then please contact us. The fastest way to get your results is to register for My Chart.   IF you received an x-ray today, you will receive an invoice from [facility]. Please contact [facility] at [phone] with questions or concerns regarding your invoice.   IF you received labwork today, you will receive an invoice from [facility]. Please contact [facility] at [phone] with questions or concerns regarding your invoice.   Our billing staff will not be able to assist you with questions regarding bills from these companies.  You will be contacted with the lab results as soon as they are available. The fastest way to get your results is to activate your My Chart account. Instructions are located on the last page of this paperwork. If you have not heard from [place] regarding the results in 2 weeks, please contact this office.

## 2019-09-30 LAB — LIPID PANEL
Chol/HDL Ratio: 3.2 ratio (ref 0.0–4.4)
Cholesterol, Total: 158 mg/dL (ref 100–199)
HDL: 49 mg/dL (ref >39)
LDL Chol Calc (NIH): 92 mg/dL (ref 0–99)
Triglycerides: 89 mg/dL (ref 0–149)
VLDL Cholesterol Cal: 17 mg/dL (ref 5–40)

## 2019-09-30 LAB — COMPREHENSIVE METABOLIC PANEL
ALT: 46 IU/L — ABNORMAL HIGH (ref 0–32)
AST: 45 IU/L — ABNORMAL HIGH (ref 0–40)
Albumin/Globulin Ratio: 1.5 (ref 1.2–2.2)
Albumin: 4.6 g/dL (ref 3.8–4.8)
Alkaline Phosphatase: 171 IU/L — ABNORMAL HIGH (ref 39–117)
BUN/Creatinine Ratio: 16 (ref 12–28)
BUN: 12 mg/dL (ref 8–27)
Bilirubin Total: 0.6 mg/dL (ref 0.0–1.2)
CO2: 20 mmol/L (ref 20–29)
Calcium: 9.5 mg/dL (ref 8.7–10.3)
Chloride: 104 mmol/L (ref 96–106)
Creatinine, Ser: 0.75 mg/dL (ref 0.57–1.00)
GFR calc Af Amer: 99 mL/min/{1.73_m2} (ref >59)
GFR calc non Af Amer: 86 mL/min/{1.73_m2} (ref >59)
Globulin, Total: 3.1 g/dL (ref 1.5–4.5)
Glucose: 110 mg/dL — ABNORMAL HIGH (ref 65–99)
Potassium: 3.8 mmol/L (ref 3.5–5.2)
Sodium: 139 mmol/L (ref 134–144)
Total Protein: 7.7 g/dL (ref 6.0–8.5)

## 2019-12-01 ENCOUNTER — Other Ambulatory Visit: Payer: Self-pay | Admitting: Obstetrics

## 2019-12-01 DIAGNOSIS — R928 Other abnormal and inconclusive findings on diagnostic imaging of breast: Secondary | ICD-10-CM

## 2019-12-14 ENCOUNTER — Ambulatory Visit: Payer: 59

## 2019-12-14 ENCOUNTER — Ambulatory Visit
Admission: RE | Admit: 2019-12-14 | Discharge: 2019-12-14 | Disposition: A | Discharge status: Home / Self Care | Payer: 59 | Source: Ambulatory Visit | Attending: Obstetrics | Admitting: Obstetrics

## 2019-12-14 ENCOUNTER — Other Ambulatory Visit: Payer: Self-pay

## 2019-12-14 DIAGNOSIS — R928 Other abnormal and inconclusive findings on diagnostic imaging of breast: Secondary | ICD-10-CM

## 2020-01-24 ENCOUNTER — Other Ambulatory Visit: Payer: Self-pay | Admitting: Family Medicine

## 2020-01-24 DIAGNOSIS — J45909 Unspecified asthma, uncomplicated: Secondary | ICD-10-CM

## 2020-01-24 NOTE — Telephone Encounter (Signed)
Requested medication (s) are due for refill today: yes  Requested medication (s) are on the active medication list: yes  Last refill:   Future visit scheduled:   Notes to clinic:  pharmacy requesting alternative     Requested Prescriptions  Pending Prescriptions Disp Refills   ADVAIR HFA 45-21 MCG/ACT inhaler [Pharmacy Med Name: ADVAIR HFA 45-21 MCG INHALER] 1 Inhaler 0    Sig: Please specify directions, refills and quantity      Pulmonology:  Combination Products Passed - 01/24/2020 10:41 AM      Passed - Valid encounter within last 12 months    Recent Outpatient Visits           3 months ago Uncomplicated asthma, unspecified asthma severity, unspecified whether persistent   [facility], Ranell Patrick, MD   6 months ago Alkaline phosphatase elevation   [facility], Ranell Patrick, MD   6 months ago Annual physical exam   [facility], Ranell Patrick, [place]   1 year ago Acute otitis media, unspecified otitis media type   [facility], Ellie Lunch, [place]   1 year ago Annual physical exam   [facility] at [facility], Gelene Mink, [place]

## 2020-02-27 ENCOUNTER — Ambulatory Visit: Payer: 59 | Admitting: Internal Medicine

## 2020-03-26 ENCOUNTER — Encounter: Payer: Self-pay | Admitting: Internal Medicine

## 2020-03-26 ENCOUNTER — Other Ambulatory Visit: Payer: Self-pay

## 2020-03-26 ENCOUNTER — Ambulatory Visit: Payer: 59 | Admitting: Internal Medicine

## 2020-03-26 VITALS — BP 158/90 | HR 88 | Ht 65.5 in | Wt 232.0 lb

## 2020-03-26 DIAGNOSIS — E559 Vitamin D deficiency, unspecified: Secondary | ICD-10-CM | POA: Diagnosis not present

## 2020-03-26 DIAGNOSIS — R748 Abnormal levels of other serum enzymes: Secondary | ICD-10-CM

## 2020-03-26 LAB — VITAMIN D 25 HYDROXY (VIT D DEFICIENCY, FRACTURES): VITD: 25.79 ng/mL — ABNORMAL LOW (ref 30.00–100.00)

## 2020-03-26 NOTE — Patient Instructions (Signed)
Please stop at the lab.  Please come back for a follow-up appointment in 1 year.  

## 2020-03-26 NOTE — Progress Notes (Signed)
Patient ID: Nicole Reyes, female   DOB: [birth year], 64 y.o.   MRN: [id number]   HPI  Nicole Reyes is a 64 y.o.-year-old female, referred by her PCP, Dr. Carlota Raspberry, for evaluation and management of high alkaline phosphatase.  Patient was found to have a high alkaline phosphatase in 2013: Lab Results  Component Value Date   ALKPHOS 171 (H) 09/29/2019   ALKPHOS 192 (H) 07/20/2019   ALKPHOS 176 (H) 06/29/2019   ALKPHOS 161 (H) 06/10/2018   ALKPHOS 129 (H) 02/12/2017   ALKPHOS 120 03/21/2016   ALKPHOS 107 10/06/2015   ALKPHOS 109 01/25/2015   ALKPHOS 114 04/09/2014   ALKPHOS 115 03/27/2013   ALKPHOS 145 (H) 06/06/2012   ALKPHOS 107 07/10/2010   ALKPHOS 104 07/20/2008   Her GGT and the rest of her LFTs were normal: Component     Latest Ref Rng & Units 07/20/2019  Total Protein     6.0 - 8.5 g/dL 7.1  Albumin     3.8 - 4.8 g/dL 4.4  Total Bilirubin     0.0 - 1.2 mg/dL 0.4  BILIRUBIN, DIRECT     0.00 - 0.40 mg/dL 0.11  Alkaline Phosphatase     39 - 117 IU/L 192 (H)  AST     0 - 40 IU/L 33  ALT     0 - 32 IU/L 29  GGT     0 - 60 IU/L 45   She has a history of: - 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  Photopenic defect at RIGHT knee from knee prosthesis.  Mild uptake of tracer adjacent to the tibial component of the prosthesis, nonspecific.  Questionable subtle uptake within the midthoracic spine at approximately the approximately T6-T7, corresponding to  degenerative disc disease changes on chest radiograph.  No additional abnormal osseous tracer accumulation identified.  Probable narrow angle scatter at several lower anterior ribs.  Expected urinary tract and soft tissue distribution of tracer.  IMPRESSION: Scattered degenerative type uptake as above.  RIGHT knee prosthesis with minimal adjacent nonspecific tracer uptake.  No other definite scintigraphic abnormalities.   Electronically Signed   By: Lavonia Dana M.D.   On: 09/15/2019 15:57    No signs of skeletal Paget's disease.  No previous DXA scans are available.  No history of fractures or osteoporosis.  No history of vitamin D deficiency or osteomalacia.  Reviewed available vitamin D levels: Lab Results  Component Value Date   VD25OH 35.5 06/10/2018   She was taking calcium and vitamin D from [facility]. Now off the [facility].  No history of hyper or hypokalemia or hyperparathyroidism.  No history of kidney stones. Lab Results  Component Value Date   CALCIUM 9.5 09/29/2019   CALCIUM 9.4 06/29/2019   CALCIUM 9.0 09/01/2018   CALCIUM 9.4 08/25/2018   CALCIUM 9.3 06/10/2018   CALCIUM 9.6 02/12/2017   CALCIUM 9.0 03/21/2016   CALCIUM 9.2 10/06/2015   CALCIUM 9.3 01/25/2015   CALCIUM 9.6 04/09/2014   No results found for: PHOS   No history of thyrotoxicosis: Lab Results  Component Value  Date   TSH 0.996 06/29/2019   TSH 2.360 06/10/2018   TSH 1.220 02/12/2017   TSH 1.37 03/21/2016   TSH 1.379 01/25/2015   No history of CKD. Last BUN/Cr: Lab Results  Component Value Date   BUN 12 09/29/2019   CREATININE 0.75 09/29/2019   She denies history of cancer, particularly osteosarcoma, or known bone metastases.  No diabetes: Lab Results  Component Value Date   HGBA1C 5.2 02/12/2017   No history of myeloid metaplasia.  Reviewed latest CBC: Lab Results  Component Value Date   WBC 6.9 09/03/2018   HGB 12.4 09/03/2018   HCT 38.5 09/03/2018   MCV 82.1  09/03/2018   PLT 286 09/03/2018   She also has a history of HTN.  ROS: Constitutional: no weight gain/no weight loss, + fatigue, no subjective hyperthermia, no subjective hypothermia Eyes: no blurry vision, no xerophthalmia ENT: no sore throat, no nodules palpated in neck, no dysphagia, no odynophagia, no hoarseness Cardiovascular: no CP/no SOB/no palpitations/+ left leg swelling Respiratory: no cough/no SOB/no wheezing Gastrointestinal: no N/no V/no D/+ C/no acid reflux Musculoskeletal: + Muscle and joint pains Skin: no rashes, no hair loss Neurological: + tremors/no numbness/no tingling/no dizziness, + headaches  I reviewed pt's medications, allergies, PMH, social hx, family hx, and changes were documented in the history of present illness. Otherwise, unchanged from my initial visit note.  Past Medical History:  Diagnosis Date  . Allergy   . Anxiety   . Arthritis   . Asthma   . Hypertension    Past Surgical History:  Procedure Laterality Date  . COLONOSCOPY  06/29/2011   Normal  . COLONOSCOPY&ENDOSCOPY  1980's   PT UNSURE PLACE&MD WHO DID EXAMS=NORMAL  . DILATION AND CURETTAGE OF UTERUS    . LAPAROSCOPY     REMOVED SCARE TISSUE FROM OVARIES  . TOTAL KNEE ARTHROPLASTY Right 08/31/2018   Procedure: RIGHT  TOTAL KNEE ARTHROPLASTY;  Surgeon: Susa Day, MD;  Location: [facility];  Service: Orthopedics;  Laterality: Right;   Social History   Socioeconomic History  . Marital status: Single    Spouse name: n/a  . Number of children: 0  . Years of education: Not on file  . Highest education level: Not on file  Occupational History  . Occupation:  Heritage manager:   Social Needs  . Financial resource strain: Not on file  . Food insecurity    Worry: Not on file    Inability: Not on file  . Transportation needs    Medical: Not on file    Non-medical: Not on file  Tobacco Use  . Smoking status: Never Smoker  . Smokeless tobacco: Never Used  Substance and Sexual  Activity  . Alcohol use: No    Alcohol/week: 0.0 standard drinks  . Drug use: No   Current Outpatient Medications on File Prior to Visit  Medication Sig Dispense Refill  . ADVAIR HFA 45-21 MCG/ACT inhaler Please specify directions, refills and quantity 1 Inhaler 0  . albuterol (PROAIR HFA) 108 (90 Base) MCG/ACT inhaler TAKE 2 PUFFS BY MOUTH EVERY 6 HOURS AS NEEDED FOR WHEEZE 8.5 Inhaler 1  . amLODipine (NORVASC) 10 MG tablet Take 1 tablet (10 mg total) by mouth daily. 90 tablet 1  . cetirizine (ZYRTEC) 10 MG tablet Take 1 tablet (10 mg total) by mouth daily. 90 tablet 1  . montelukast (SINGULAIR) 10 MG tablet Take 1 tablet (10 mg total) by mouth at bedtime. 90 tablet 1  . atorvastatin (LIPITOR)  10 MG tablet Take 1 tablet (10 mg total) by mouth daily. (Patient not taking: Reported on 03/26/2020) 90 tablet 0  . hydrochlorothiazide (MICROZIDE) 12.5 MG capsule Take 1 capsule (12.5 mg total) by mouth daily. (Patient not taking: Reported on 03/26/2020) 90 capsule 1  . Olopatadine HCl (PATADAY) 0.2 % SOLN Place 1 drop into both eyes daily as needed. (Patient not taking: Reported on 03/26/2020) 2.5 mL 5   No current facility-administered medications on file prior to visit.   Allergies  Allergen Reactions  . Shellfish Allergy Anaphylaxis  . Peanut-Containing Drug Products     Also walnuts and almonds    Family History  Problem Relation Age of Onset  . Alcohol abuse Father   . Heart disease Father 21       AMI age 64  . Stroke Father 80       CVA  . Hypertension Mother   . Asthma Mother   . Hypertension Sister   . Hypertension Brother   . Hyperlipidemia Brother   . Hypertension Sister   . Hypertension Sister   . Hypertension Sister   . Hypertension Brother   . Colon cancer Neg Hx   Also, diabetes in sister, thyroid disease in mother, hyperlipidemia in brother, sisters  PE: BP (!) 158/90   Pulse 88   Ht 5' 5.5" (1.664 m)   Wt 232 lb (105.2 kg)   SpO2 98%   BMI 38.02 kg/m  Wt  Readings from Last 3 Encounters:  03/26/20 232 lb (105.2 kg)  09/29/19 237 lb 9.6 oz (107.8 kg)  08/28/19 235 lb 12.8 oz (107 kg)   Constitutional: overweight, in NAD Eyes: PERRLA, EOMI, no exophthalmos ENT: moist mucous membranes, no thyromegaly, no cervical lymphadenopathy Cardiovascular: RRR, No MRG Respiratory: CTA B Gastrointestinal: abdomen soft, NT, ND, BS+ Musculoskeletal: no deformities, strength intact in all 4 Skin: moist, warm, no rashes Neurological: + tremor with outstretched hands, DTR normal in all 4  Assessment: 1. High alkaline phosphatase  Plan: 1. High alkaline phosphatase -Reviewed her alkaline phosphatase levels along with the patient.  In the latest years, they were above the normal range and increasing. Last A phos was lower then. -Again reviewed possible reasons for elevated alkaline phosphatase to include: After fatty meals (but we checked the last level of fasting), after fractures, in pregnancy, during growth (but these are not pertinent to the patient).  Other possible causes are:  Vitamin D deficiency and associated osteomalacia (her vitamin D level was normal in the last year)  osteogenic sarcoma/bone mets or other cancers (no known cancer, no bone pain)  hyperparathyroidism  (calcium levels were normal)  Hyperthyroidism (tests were normal)  Uncontrolled diabetes (no history of this)  Myeloid metaplasia (no history of this)  Paget's disease (we checked a bone scan at last visit and this was negative for pagetoid lesions) -Therefore, either she has subclinical Paget's disease or her elevated alkaline phosphatase is idiopathic -Regardless of the etiology, we can continue to follow her without treatment -If she were to develop skeletal pain or clear pagetoid lesions, we may need to use bisphosphonate: Most likely Reclast x1 dose -Reviewed possible complications from Paget disease: increased pain in the affected bone, instability of the joint near the  lesion, fractures, hearing loss if Paget affects the skull, and, very rarely CHF and osteosarcoma -At this visit, we will recheck her alkaline phosphatase - total and bone specific (had a banana, 2 slim Jim,  + tea) and will also check a vitamin  D level -After this we will continue to follow her with yearly alkaline phosphatase levels. Will have her back in 1 year and afterwards can f/u with PCP.  Component     Latest Ref Rng & Units 03/26/2020  Alkaline Phosphatase     39 - 117 IU/L 164 (H)  Tandem-R Ostase   Premenopausal Women:  6.0 - 22.7   Postmenopausal Women:  8.1 - 31.6   ug/L (bone specific alkaline phosphatase) 49.8  VITD     30.00 - 100.00 ng/mL 25.79 (L)   Vitamin D level is slightly low.  We will start 1000 units vitamin D daily.  We can recheck the level in 2 to 3 months.  Alkaline phosphatase is still high, but improved.  The bone specific alkaline phosphatase is higher than normal, as expected.    Philemon Kingdom, MD PhD [facility]

## 2020-03-28 ENCOUNTER — Ambulatory Visit: Payer: 59 | Admitting: Family Medicine

## 2020-03-28 ENCOUNTER — Other Ambulatory Visit: Payer: Self-pay

## 2020-03-28 ENCOUNTER — Encounter: Payer: Self-pay | Admitting: Family Medicine

## 2020-03-28 VITALS — BP 165/85 | HR 90 | Temp 98.5°F | Ht 65.5 in | Wt 230.0 lb

## 2020-03-28 DIAGNOSIS — F411 Generalized anxiety disorder: Secondary | ICD-10-CM

## 2020-03-28 DIAGNOSIS — L500 Allergic urticaria: Secondary | ICD-10-CM | POA: Diagnosis not present

## 2020-03-28 DIAGNOSIS — Z91010 Allergy to peanuts: Secondary | ICD-10-CM

## 2020-03-28 DIAGNOSIS — F439 Reaction to severe stress, unspecified: Secondary | ICD-10-CM

## 2020-03-28 LAB — ALKALINE PHOSPHATASE, BONE SPECIFIC: Tandem-R Ostase: 49.8 ug/L

## 2020-03-28 LAB — ALKALINE PHOSPHATASE: Alkaline Phosphatase: 164 IU/L — ABNORMAL HIGH (ref 39–117)

## 2020-03-28 MED ORDER — SERTRALINE HCL 25 MG PO TABS: 25.0000 mg | ORAL_TABLET | Freq: Every day | ORAL | 1 refills | Status: DC

## 2020-03-28 MED ORDER — HYDROXYZINE HCL 10 MG PO TABS: 10.0000 mg | ORAL_TABLET | Freq: Three times a day (TID) | ORAL | 0 refills | Status: DC | PRN

## 2020-03-28 MED ORDER — EPINEPHRINE 0.3 MG/0.3ML IJ SOAJ: 0.3000 mg | INTRAMUSCULAR | 0 refills | Status: DC | PRN

## 2020-03-28 NOTE — Patient Instructions (Addendum)
I do recommend meeting with a counselor and can provide some numbers for you.  Some time off may also be helpful.  See info on stress and anxiety below.  Start low dose zoloft  29m once per day.  Hydroxyzine as needed for anxiety sleep or itching/hives. Do not combine with bendryl  Here are a few options for counseling:  [facility]  [id number] [phone] Recheck in 3 weeks.   Stress, Adult Stress is a normal reaction to life events. Stress is what you feel when life demands more than you are used to, or more than you think you can handle. Some stress can be useful, such as studying for a test or meeting a deadline at work. Stress that occurs too often or for too long can cause problems. It can affect your emotional health and interfere with relationships and normal daily activities. Too much stress can weaken your body's defense system (immune system) and increase your risk for physical illness. If you already have a medical problem, stress can make it worse. What are the causes? All sorts of life events can cause stress. An event that causes stress for one person may not be stressful for another person. Major life events, whether positive or negative, commonly cause stress. Examples include:  Losing a job or starting a new job.  Losing a loved one.  Moving to a new town or home.  Getting married or divorced.  Having a baby.  Getting injured or sick. Less obvious life events can also cause stress, especially if they occur day after day or in combination with each other. Examples include:  Working long hours.  Driving in traffic.  Caring for children.  Being in debt.  Being in a difficult relationship. What are the signs or symptoms? Stress can cause emotional symptoms, including:  Anxiety. This is feeling worried, afraid, on edge, overwhelmed, or out of control.  Anger, including irritation or  impatience.  Depression. This is feeling sad, down, helpless, or guilty.  Trouble focusing, remembering, or making decisions. Stress can cause physical symptoms, including:  Aches and pains. These may affect your head, neck, back, stomach, or other areas of your body.  Tight muscles or a clenched jaw.  Low energy.  Trouble sleeping. Stress can cause unhealthy behaviors, including:  Eating to feel better (overeating) or skipping meals.  Working too much or putting off tasks.  Smoking, drinking alcohol, or using drugs to feel better. How is this diagnosed? Stress is diagnosed through an assessment by your health care provider. He or she may diagnose this condition based on:  Your symptoms and any stressful life events.  Your medical history.  Tests to rule out other causes of your symptoms. Depending on your condition, your health care provider may refer you to a specialist for further evaluation. How is this treated?  Stress management techniques are the recommended treatment for stress. Medicine is not typically recommended for the treatment of stress. Techniques to reduce your reaction to stressful life events include:  Stress identification. Monitor yourself for symptoms of stress and identify what causes stress for you. These skills may help you to avoid or prepare for stressful events.  Time management. Set your priorities, keep a calendar of events, and learn to say no. Taking these actions can help you avoid making too many commitments. Techniques for coping with stress include:  Rethinking the problem. Try to think realistically about stressful events rather than ignoring them or overreacting.  Try to find the positives in a stressful situation rather than focusing on the negatives.  Exercise. Physical exercise can release both physical and emotional tension. The key is to find a form of exercise that you enjoy and do it regularly.  Relaxation techniques. These relax  the body and mind. The key is to find one or more that you enjoy and use the techniques regularly. Examples include: ? Meditation, deep breathing, or progressive relaxation techniques. ? Yoga or tai chi. ? Biofeedback, mindfulness techniques, or journaling. ? Listening to music, being out in nature, or participating in other hobbies.  Practicing a healthy lifestyle. Eat a balanced diet, drink plenty of water, limit or avoid caffeine, and get plenty of sleep.  Having a strong support network. Spend time with family, friends, or other people you enjoy being around. Express your feelings and talk things over with someone you trust. Counseling or talk therapy with a mental health professional may be helpful if you are having trouble managing stress on your own. Follow these instructions at home: Lifestyle   Avoid drugs.  Do not use any products that contain nicotine or tobacco, such as cigarettes, e-cigarettes, and chewing tobacco. If you need help quitting, ask your health care provider.  Limit alcohol intake to no more than 1 drink a day for nonpregnant women and 2 drinks a day for men. One drink equals 12 oz of beer, 5 oz of wine, or 1 oz of hard liquor  Do not use alcohol or drugs to relax.  Eat a balanced diet that includes fresh fruits and vegetables, whole grains, lean meats, fish, eggs, and beans, and low-fat dairy. Avoid processed foods and foods high in added fat, sugar, and salt.  Exercise at least 30 minutes on 5 or more days each week.  Get 7-8 hours of sleep each night. General instructions   Practice stress management techniques as discussed with your health care provider.  Drink enough fluid to keep your urine clear or pale yellow.  Take over-the-counter and prescription medicines only as told by your health care provider.  Keep all follow-up visits as told by your health care provider. This is important. Contact a health care provider if:  Your symptoms get  worse.  You have new symptoms.  You feel overwhelmed by your problems and can no longer manage them on your own. Get help right away if:  You have thoughts of hurting yourself or others. If you ever feel like you may hurt yourself or others, or have thoughts about taking your own life, get help right away. You can go to your nearest emergency department or call:  Your local emergency services (911 in the [place]  A suicide crisis helpline, such as the [facility] at 502-443-7270. This is open 24 hours a day. Summary  Stress is a normal reaction to life events. It can cause problems if it happens too often or for too long.  Practicing stress management techniques is the best way to treat stress.  Counseling or talk therapy with a mental health professional may be helpful if you are having trouble managing stress on your own. This information is not intended to replace advice given to you by your health care provider. Make sure you discuss any questions you have with your health care provider. Document Revised: 06/30/2019 Document Reviewed: 01/20/2017 Elsevier Patient Education  [place], Adult After being diagnosed with an anxiety disorder, you may be relieved to  know why you have felt or behaved a certain way. You may also feel overwhelmed about the treatment ahead and what it will mean for your life. With care and support, you can manage this condition and recover from it. How to manage lifestyle changes Managing stress and anxiety  Stress is your body's reaction to life changes and events, both good and bad. Most stress will last just a few hours, but stress can be ongoing and can lead to more than just stress. Although stress can play a major role in anxiety, it is not the same as anxiety. Stress is usually caused by something external, such as a deadline, test, or competition. Stress normally passes after the triggering event  has ended.  Anxiety is caused by something internal, such as imagining a terrible outcome or worrying that something will go wrong that will devastate you. Anxiety often does not go away even after the triggering event is over, and it can become long-term (chronic) worry. It is important to understand the differences between stress and anxiety and to manage your stress effectively so that it does not lead to an anxious response. Talk with your health care provider or a counselor to learn more about reducing anxiety and stress. He or she may suggest tension reduction techniques, such as:  Music therapy. This can include creating or listening to music that you enjoy and that inspires you.  Mindfulness-based meditation. This involves being aware of your normal breaths while not trying to control your breathing. It can be done while sitting or walking.  Centering prayer. This involves focusing on a word, phrase, or sacred image that means something to you and brings you peace.  Deep breathing. To do this, expand your stomach and inhale slowly through your nose. Hold your breath for 3-5 seconds. Then exhale slowly, letting your stomach muscles relax.  Self-talk. This involves identifying thought patterns that lead to anxiety reactions and changing those patterns.  Muscle relaxation. This involves tensing muscles and then relaxing them. Choose a tension reduction technique that suits your lifestyle and personality. These techniques take time and practice. Set aside 5-15 minutes a day to do them. Therapists can offer counseling and training in these techniques. The training to help with anxiety may be covered by some insurance plans. Other things you can do to manage stress and anxiety include:  Keeping a stress/anxiety diary. This can help you learn what triggers your reaction and then learn ways to manage your response.  Thinking about how you react to certain situations. You may not be able to control  everything, but you can control your response.  Making time for activities that help you relax and not feeling guilty about spending your time in this way.  Visual imagery and yoga can help you stay calm and relax.  Medicines Medicines can help ease symptoms. Medicines for anxiety include:  Anti-anxiety drugs.  Antidepressants. Medicines are often used as a primary treatment for anxiety disorder. Medicines will be prescribed by a health care provider. When used together, medicines, psychotherapy, and tension reduction techniques may be the most effective treatment. Relationships Relationships can play a big part in helping you recover. Try to spend more time connecting with trusted friends and family members. Consider going to couples counseling, taking family education classes, or going to family therapy. Therapy can help you and others better understand your condition. How to recognize changes in your anxiety Everyone responds differently to treatment for anxiety. Recovery from anxiety happens when symptoms  decrease and stop interfering with your daily activities at home or work. This may mean that you will start to:  Have better concentration and focus. Worry will interfere less in your daily thinking.  Sleep better.  Be less irritable.  Have more energy.  Have improved memory. It is important to recognize when your condition is getting worse. Contact your health care provider if your symptoms interfere with home or work and you feel like your condition is not improving. Follow these instructions at home: Activity  Exercise. Most adults should do the following: ? Exercise for at least 150 minutes each week. The exercise should increase your heart rate and make you sweat (moderate-intensity exercise). ? Strengthening exercises at least twice a week.  Get the right amount and quality of sleep. Most adults need 7-9 hours of sleep each night. Lifestyle   Eat a healthy diet that  includes plenty of vegetables, fruits, whole grains, low-fat dairy products, and lean protein. Do not eat a lot of foods that are high in solid fats, added sugars, or salt.  Make choices that simplify your life.  Do not use any products that contain nicotine or tobacco, such as cigarettes, e-cigarettes, and chewing tobacco. If you need help quitting, ask your health care provider.  Avoid caffeine, alcohol, and certain over-the-counter cold medicines. These may make you feel worse. Ask your pharmacist which medicines to avoid. General instructions  Take over-the-counter and prescription medicines only as told by your health care provider.  Keep all follow-up visits as told by your health care provider. This is important. Where to find support You can get help and support from these sources:  Self-help groups.  Online and [employer].  A trusted spiritual leader.  Couples counseling.  Family education classes.  Family therapy. Where to find more information You may find that joining a support group helps you deal with your anxiety. The following sources can help you locate counselors or support groups near you:  [place]: www.mentalhealth[place].net  Anxiety and Depression Association of [place] (ADAA): https://www.clark.net/  National Alliance on Mental Illness (NAMI): www.nami.org Contact a health care provider if you:  Have a hard time staying focused or finishing daily tasks.  Spend many hours a day feeling worried about everyday life.  Become exhausted by worry.  Start to have headaches, feel tense, or have nausea.  Urinate more than normal.  Have diarrhea. Get help right away if you have:  A racing heart and shortness of breath.  Thoughts of hurting yourself or others. If you ever feel like you may hurt yourself or others, or have thoughts about taking your own life, get help right away. You can go to your nearest emergency department or  call:  Your local emergency services (911 in the [place]  A suicide crisis helpline, such as the [place] at 281-280-2996. This is open 24 hours a day. Summary  Taking steps to learn and use tension reduction techniques can help calm you and help prevent triggering an anxiety reaction.  When used together, medicines, psychotherapy, and tension reduction techniques may be the most effective treatment.  Family, friends, and partners can play a big part in helping you recover from an anxiety disorder. This information is not intended to replace advice given to you by your health care provider. Make sure you discuss any questions you have with your health care provider. Document Revised: 05/02/2019 Document Reviewed: 05/02/2019 Elsevier Patient Education  [employer].  If you  have lab work done today you will be contacted with your lab results within the next 2 weeks.  If you have not heard from [place] then please contact us. The fastest way to get your results is to register for My Chart.   IF you received an x-ray today, you will receive an invoice from [facility]. Please contact [facility] at [phone] with questions or concerns regarding your invoice.   IF you received labwork today, you will receive an invoice from [facility]. Please contact [facility] at [phone] with questions or concerns regarding your invoice.   Our billing staff will not be able to assist you with questions regarding bills from these companies.  You will be contacted with the lab results as soon as they are available. The fastest way to get your results is to activate your My Chart account. Instructions are located on the last page of this paperwork. If you have not heard from [place] regarding the results in 2 weeks, please contact this office.

## 2020-03-28 NOTE — Progress Notes (Signed)
Subjective:  Patient ID: Nicole Reyes, female    DOB: [birth year]  Age: 64 y.o. MRN: [id number]  CC:  Chief Complaint  Patient presents with  . Anxiety    nervousness, jittery , easily irrritable, pt relates most of this to her currrent employment  . 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 Stressed past year. Last minute schedule changes. Change in management.  Spoke to supervisor at work about concerns. No specific changes recommended.  Treated for depression and anxiety many years ago. Mild sedative - daily med. Unknown name.  No recent counseling. Plans for time off next week.  Coping techniques - prayer, meditation, reading bible has been helpful.  Always a nervous person, but feels like more nervous as getting older.   Has not taken BP med yet today.  GAD 7 : Generalized Anxiety Score 03/28/2020  Nervous, Anxious, on Edge 3  Control/stop worrying 0  Worry too much - different things 3  Trouble relaxing 3  Restless 0  Easily annoyed or irritable 0  Afraid - awful might happen 0  Total GAD 7 Score 9  Anxiety Difficulty Somewhat difficult   GAD 7: score of 9 PHQ 13.   Depression screen [facility] 2/9 03/28/2020 09/29/2019 07/20/2019 06/29/2019 01/07/2019  Decreased Interest 0 0 0 0 0  Down, Depressed, Hopeless 3 0 0 0 0  PHQ - 2 Score 3 0 0 0 0  Altered sleeping 3 - - - -  Tired, decreased energy 3 - - - -  Change in appetite 2 - - - -  Feeling bad or failure about yourself  0 - - - -  Trouble concentrating 2 - - - -  Moving slowly or fidgety/restless 0 - - - -  Suicidal thoughts 0 - - - -  PHQ-9 Score 13 - - - -  Difficult doing work/chores - - - - -      Hives/itching: History of multiple allergies, seasonal allergies, allergic urticaria, treated with Zyrtec in the past. On zyrtec  nightly.  Prior hives resolved with benadryl as needed. Once per day.   Asthma doing well with inhaler. No recent flair.   Needs updated epipen for peanut allergy.  History Patient Active Problem List   Diagnosis Date Noted  . Acute otitis media 01/08/2019  . Sore throat 01/08/2019  . Osteoarthritis of right knee 08/31/2018  . Right knee DJD 08/31/2018  . Allergic reaction 05/23/2018  . Allergic urticaria 05/23/2018  . Perennial and seasonal allergic rhinitis 05/23/2018  . Allergic conjunctivitis 05/23/2018  . Food allergy 05/23/2018  . Carpal tunnel syndrome of left wrist 01/25/2018  . 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 Dry eye syndrome 11/20/2017  . Cataracts, bilateral 11/20/2017  . 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 Obesity 01/25/2015  . Pure hypercholesterolemia 02/10/2014  . Moderate persistent asthma 12/19/2012  . Hypertensive disorder 12/19/2012   Past Medical History:  Diagnosis Date  . Allergy   . Anxiety   . Arthritis   . Asthma   . Hypertension    Past Surgical History:  Procedure Laterality Date  . COLONOSCOPY  06/29/2011   Normal  . COLONOSCOPY&ENDOSCOPY  1980's   PT UNSURE PLACE&MD WHO DID EXAMS=NORMAL  . DILATION AND CURETTAGE OF UTERUS    .  LAPAROSCOPY     REMOVED SCARE TISSUE FROM OVARIES  . TOTAL KNEE ARTHROPLASTY Right 08/31/2018   Procedure: RIGHT  TOTAL KNEE ARTHROPLASTY;  Surgeon: Susa Day, MD;  Location: [facility];  Service: Orthopedics;  Laterality: Right;   Allergies  Allergen Reactions  . Peanut-Containing Drug Products Anaphylaxis    Also walnuts and almonds   . Shellfish Allergy Hives   Prior to Admission medications   Medication Sig Start Date End Date Taking? Authorizing Provider  AD[facility] [phone] MCG/ACT inhaler Please specify directions, refills and quantity 01/24/20  Yes Wendie Agreste, MD  albuterol [facility] HFA) 108 (90 Base) MCG/ACT inhaler TAKE 2 PUFFS BY MOUTH EVERY 6 HOURS AS NEEDED  FOR WHEEZE 04/10/19  Yes McVey, Gelene Mink, PA-C  amLODipine (NORVASC) 10 MG tablet Take 1 tablet (10 mg total) by mouth daily. 09/29/19  Yes Wendie Agreste, MD  budesonide-formoterol [facility]) 80-4.5 MCG/ACT inhaler Inhale 2 puffs into the lungs 2 (two) times daily.   Yes [provider]  cetirizine (ZYRTEC) 10 MG tablet Take 1 tablet (10 mg total) by mouth daily. 09/29/19  Yes Wendie Agreste, MD  atorvastatin (LIPITOR) 10 MG tablet Take 1 tablet (10 mg total) by mouth daily. Patient not taking: Reported on 03/26/2020 09/29/19   Wendie Agreste, MD  hydrochlorothiazide (MICROZIDE) 12.5 MG capsule Take 1 capsule (12.5 mg total) by mouth daily. Patient not taking: Reported on 03/26/2020 09/29/19   Wendie Agreste, MD  nystatin cream (MYCOSTATIN) nystatin 100,000 unit/gram topical cream  APPLY TOPICALLY 2 (TWO) TIMES DAILY.    [provider]  Olopatadine HCl (PATADAY) 0.2 % SOLN Place 1 drop into both eyes daily as needed. Patient not taking: Reported on 03/26/2020 09/29/19   Wendie Agreste, MD   Social History   Socioeconomic History  . Marital status: Single    Spouse name: n/a  . Number of children: 0  . Years of education: Not on file  . Highest education level: Not on file  Occupational History  . Occupation: employed    Fish farm manager: [employer]  Tobacco Use  . Smoking status: Never Smoker  . Smokeless tobacco: Never Used  Substance and Sexual Activity  . Alcohol use: No    Alcohol/week: 0.0 standard drinks  . Drug use: No  . Sexual activity: Yes    Birth control/protection: Post-menopausal  Other Topics Concern  . 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      Tobacco: none      Alcohol: none      Exercise:  Four days per week.   Social Determinants of Health   Financial Resource Strain:   . Difficulty of Paying  Living Expenses:   Food Insecurity:   . Worried About Charity fundraiser in the Last Year:   . Arboriculturist in the Last Year:   Transportation Needs:   . Film/video editor (Medical):   Marland Kitchen Lack of Transportation (Non-Medical):   Physical Activity:   . Days of Exercise per Week:   . Minutes of Exercise per Session:   Stress:   . Feeling of Stress :   Social Connections:   . Frequency of Communication with Friends and Family:   . Frequency of Social Gatherings with Friends and Family:   .  Attends Religious Services:   . Active Member of Clubs or Organizations:   . Attends Archivist Meetings:   Marland Kitchen Marital Status:   Intimate Partner Violence:   . Fear of Current or Ex-Partner:   . Emotionally Abused:   Marland Kitchen Physically Abused:   . Sexually Abused:     Review of Systems   Objective:   Vitals:   03/28/20 1417  BP: (!) 165/85  Pulse: 90  Temp: 98.5 F (36.9 C)  SpO2: 100%  Weight: 230 lb (104.3 kg)  Height: 5' 5.5" (1.664 m)   BP Readings from Last 3 Encounters:  03/28/20 (!) 165/85  03/26/20 (!) 158/90  09/29/19 140/79     Physical Exam Vitals reviewed.  Constitutional:      Appearance: She is well-developed.  HENT:     Head: Normocephalic and atraumatic.  Eyes:     Conjunctiva/sclera: Conjunctivae normal.     Pupils: Pupils are equal, round, and reactive to light.  Neck:     Vascular: No carotid bruit.  Cardiovascular:     Rate and Rhythm: Normal rate and regular rhythm.     Heart sounds: Normal heart sounds.  Pulmonary:     Effort: Pulmonary effort is normal.     Breath sounds: Normal breath sounds.  Abdominal:     Palpations: Abdomen is soft. There is no pulsatile mass.     Tenderness: There is no abdominal tenderness.  Skin:    General: Skin is warm and dry.     Comments: No rash/hives.   Neurological:     General: No focal deficit present.     Mental Status: She is alert and oriented to person, place, and time.  Psychiatric:         Attention and Perception: Attention normal.        Mood and Affect: Mood is anxious.        Speech: Speech normal.        Behavior: Behavior normal. Behavior is cooperative.        Thought Content: Thought content does not include homicidal or suicidal ideation.     Assessment & Plan:  Nicole Reyes is a 64 y.o. female . Generalized anxiety disorder - Plan: sertraline (ZOLOFT) 25 MG tablet, hydrOXYzine (ATARAX/VISTARIL) 10 MG tablet Situational stress - Plan: hydrOXYzine (ATARAX/VISTARIL) 10 MG tablet  - baseline GAD likely with adjustment d/o and situational stress, work stress   - zoloft low dose, possible side effects discussed. Handout on stress, anxiety treatment. Counseling recommended.. Recheck 3 weeks.   Allergic urticaria - Plan: hydrOXYzine (ATARAX/VISTARIL) 10 MG tablet  - clear on exam at present. Hydroxyzine if needed, continue zyrtec. Additive side effects discussed.   H/O peanut allergy - Plan: EPINEPHrine 0.3 mg/0.3 mL IJ SOAJ injection refilled if needed.    Meds ordered this encounter  Medications  . sertraline (ZOLOFT) 25 MG tablet    Sig: Take 1 tablet (25 mg total) by mouth daily.    Dispense:  30 tablet    Refill:  1  . hydrOXYzine (ATARAX/VISTARIL) 10 MG tablet    Sig: Take 1 tablet (10 mg total) by mouth 3 (three) times daily as needed.    Dispense:  30 tablet    Refill:  0  . EPINEPHrine 0.3 mg/0.3 mL IJ SOAJ injection    Sig: Inject 0.3 mLs (0.3 mg total) into the muscle as needed for anaphylaxis.    Dispense:  1 each    Refill:  0   Patient  Instructions    I do recommend meeting with a counselor and can provide some numbers for you.  Some time off may also be helpful.  See info on stress and anxiety below.  Start low dose zoloft  70m once per day.  Hydroxyzine as needed for anxiety sleep or itching/hives. Do not combine with bendryl  Here are a few options for counseling:  [place]  [id number] Recheck in 3 weeks.   Stress, Adult Stress is a normal reaction to life events. Stress is what you feel when life demands more than you are used to, or more than you think you can handle. Some stress can be useful, such as studying for a test or meeting a deadline at work. Stress that occurs too often or for too long can cause problems. It can affect your emotional health and interfere with relationships and normal daily activities. Too much stress can weaken your body's defense system (immune system) and increase your risk for physical illness. If you already have a medical problem, stress can make it worse. What are the causes? All sorts of life events can cause stress. An event that causes stress for one person may not be stressful for another person. Major life events, whether positive or negative, commonly cause stress. Examples include:  Losing a job or starting a new job.  Losing a loved one.  Moving to a new town or home.  Getting married or divorced.  Having a baby.  Getting injured or sick. Less obvious life events can also cause stress, especially if they occur day after day or in combination with each other. Examples include:  Working long hours.  Driving in traffic.  Caring for children.  Being in debt.  Being in a difficult relationship. What are the signs or symptoms? Stress can cause emotional symptoms, including:  Anxiety. This is feeling worried, afraid, on edge, overwhelmed, or out of control.  Anger, including irritation or impatience.  Depression. This is feeling sad, down, helpless, or guilty.  Trouble focusing, remembering, or making decisions. Stress can cause physical symptoms, including:  Aches and pains. These may affect your head, neck, back, stomach, or other areas of your body.  Tight muscles or a clenched jaw.  Low energy.  Trouble sleeping. Stress can cause  unhealthy behaviors, including:  Eating to feel better (overeating) or skipping meals.  Working too much or putting off tasks.  Smoking, drinking alcohol, or using drugs to feel better. How is this diagnosed? Stress is diagnosed through an assessment by your health care provider. He or she may diagnose this condition based on:  Your symptoms and any stressful life events.  Your medical history.  Tests to rule out other causes of your symptoms. Depending on your condition, your health care provider may refer you to a specialist for further evaluation. How is this treated?  Stress management techniques are the recommended treatment for stress. Medicine is not typically recommended for the treatment of stress. Techniques to reduce your reaction to stressful life events include:  Stress identification. Monitor yourself for symptoms of stress and identify what causes stress for you. These skills may help you to avoid or prepare for stressful events.  Time management. Set your priorities, keep a calendar of events, and learn to say no. Taking these actions can help you avoid making too many commitments. Techniques for coping with stress include:  Rethinking the problem. Try to think realistically about stressful events rather  than ignoring them or overreacting. Try to find the positives in a stressful situation rather than focusing on the negatives.  Exercise. Physical exercise can release both physical and emotional tension. The key is to find a form of exercise that you enjoy and do it regularly.  Relaxation techniques. These relax the body and mind. The key is to find one or more that you enjoy and use the techniques regularly. Examples include: ? Meditation, deep breathing, or progressive relaxation techniques. ? Yoga or tai chi. ? Biofeedback, mindfulness techniques, or journaling. ? Listening to music, being out in nature, or participating in other hobbies.  Practicing a healthy  lifestyle. Eat a balanced diet, drink plenty of water, limit or avoid caffeine, and get plenty of sleep.  Having a strong support network. Spend time with family, friends, or other people you enjoy being around. Express your feelings and talk things over with someone you trust. Counseling or talk therapy with a mental health professional may be helpful if you are having trouble managing stress on your own. Follow these instructions at home: Lifestyle   Avoid drugs.  Do not use any products that contain nicotine or tobacco, such as cigarettes, e-cigarettes, and chewing tobacco. If you need help quitting, ask your health care provider.  Limit alcohol intake to no more than 1 drink a day for nonpregnant women and 2 drinks a day for men. One drink equals 12 oz of beer, 5 oz of wine, or 1 oz of hard liquor  Do not use alcohol or drugs to relax.  Eat a balanced diet that includes fresh fruits and vegetables, whole grains, lean meats, fish, eggs, and beans, and low-fat dairy. Avoid processed foods and foods high in added fat, sugar, and salt.  Exercise at least 30 minutes on 5 or more days each week.  Get 7-8 hours of sleep each night. General instructions   Practice stress management techniques as discussed with your health care provider.  Drink enough fluid to keep your urine clear or pale yellow.  Take over-the-counter and prescription medicines only as told by your health care provider.  Keep all follow-up visits as told by your health care provider. This is important. Contact a health care provider if:  Your symptoms get worse.  You have new symptoms.  You feel overwhelmed by your problems and can no longer manage them on your own. Get help right away if:  You have thoughts of hurting yourself or others. If you ever feel like you may hurt yourself or others, or have thoughts about taking your own life, get help right away. You can go to your nearest emergency department or  call:  Your local emergency services (911 in the [place]  A suicide crisis helpline, such as the [place] at (440)475-0670. This is open 24 hours a day. Summary  Stress is a normal reaction to life events. It can cause problems if it happens too often or for too long.  Practicing stress management techniques is the best way to treat stress.  Counseling or talk therapy with a mental health professional may be helpful if you are having trouble managing stress on your own. This information is not intended to replace advice given to you by your health care provider. Make sure you discuss any questions you have with your health care provider. Document Revised: 06/30/2019 Document Reviewed: 01/20/2017 Elsevier Patient Education  [place], Adult After being diagnosed with an anxiety disorder,  you may be relieved to know why you have felt or behaved a certain way. You may also feel overwhelmed about the treatment ahead and what it will mean for your life. With care and support, you can manage this condition and recover from it. How to manage lifestyle changes Managing stress and anxiety  Stress is your body's reaction to life changes and events, both good and bad. Most stress will last just a few hours, but stress can be ongoing and can lead to more than just stress. Although stress can play a major role in anxiety, it is not the same as anxiety. Stress is usually caused by something external, such as a deadline, test, or competition. Stress normally passes after the triggering event has ended.  Anxiety is caused by something internal, such as imagining a terrible outcome or worrying that something will go wrong that will devastate you. Anxiety often does not go away even after the triggering event is over, and it can become long-term (chronic) worry. It is important to understand the differences between stress and anxiety and to manage your  stress effectively so that it does not lead to an anxious response. Talk with your health care provider or a counselor to learn more about reducing anxiety and stress. He or she may suggest tension reduction techniques, such as:  Music therapy. This can include creating or listening to music that you enjoy and that inspires you.  Mindfulness-based meditation. This involves being aware of your normal breaths while not trying to control your breathing. It can be done while sitting or walking.  Centering prayer. This involves focusing on a word, phrase, or sacred image that means something to you and brings you peace.  Deep breathing. To do this, expand your stomach and inhale slowly through your nose. Hold your breath for 3-5 seconds. Then exhale slowly, letting your stomach muscles relax.  Self-talk. This involves identifying thought patterns that lead to anxiety reactions and changing those patterns.  Muscle relaxation. This involves tensing muscles and then relaxing them. Choose a tension reduction technique that suits your lifestyle and personality. These techniques take time and practice. Set aside 5-15 minutes a day to do them. Therapists can offer counseling and training in these techniques. The training to help with anxiety may be covered by some insurance plans. Other things you can do to manage stress and anxiety include:  Keeping a stress/anxiety diary. This can help you learn what triggers your reaction and then learn ways to manage your response.  Thinking about how you react to certain situations. You may not be able to control everything, but you can control your response.  Making time for activities that help you relax and not feeling guilty about spending your time in this way.  Visual imagery and yoga can help you stay calm and relax.  Medicines Medicines can help ease symptoms. Medicines for anxiety include:  Anti-anxiety drugs.  Antidepressants. Medicines are often  used as a primary treatment for anxiety disorder. Medicines will be prescribed by a health care provider. When used together, medicines, psychotherapy, and tension reduction techniques may be the most effective treatment. Relationships Relationships can play a big part in helping you recover. Try to spend more time connecting with trusted friends and family members. Consider going to couples counseling, taking family education classes, or going to family therapy. Therapy can help you and others better understand your condition. How to recognize changes in your anxiety Everyone responds differently to treatment for anxiety. Recovery  from anxiety happens when symptoms decrease and stop interfering with your daily activities at home or work. This may mean that you will start to:  Have better concentration and focus. Worry will interfere less in your daily thinking.  Sleep better.  Be less irritable.  Have more energy.  Have improved memory. It is important to recognize when your condition is getting worse. Contact your health care provider if your symptoms interfere with home or work and you feel like your condition is not improving. Follow these instructions at home: Activity  Exercise. Most adults should do the following: ? Exercise for at least 150 minutes each week. The exercise should increase your heart rate and make you sweat (moderate-intensity exercise). ? Strengthening exercises at least twice a week.  Get the right amount and quality of sleep. Most adults need 7-9 hours of sleep each night. Lifestyle   Eat a healthy diet that includes plenty of vegetables, fruits, whole grains, low-fat dairy products, and lean protein. Do not eat a lot of foods that are high in solid fats, added sugars, or salt.  Make choices that simplify your life.  Do not use any products that contain nicotine or tobacco, such as cigarettes, e-cigarettes, and chewing tobacco. If you need help quitting, ask  your health care provider.  Avoid caffeine, alcohol, and certain over-the-counter cold medicines. These may make you feel worse. Ask your pharmacist which medicines to avoid. General instructions  Take over-the-counter and prescription medicines only as told by your health care provider.  Keep all follow-up visits as told by your health care provider. This is important. Where to find support You can get help and support from these sources:  Self-help groups.  Online and [employer].  A trusted spiritual leader.  Couples counseling.  Family education classes.  Family therapy. Where to find more information You may find that joining a support group helps you deal with your anxiety. The following sources can help you locate counselors or support groups near you:  [place]: www.mentalhealth[place].net  Anxiety and Depression Association of [place] (ADAA): https://www.clark.net/  National Alliance on Mental Illness (NAMI): www.nami.org Contact a health care provider if you:  Have a hard time staying focused or finishing daily tasks.  Spend many hours a day feeling worried about everyday life.  Become exhausted by worry.  Start to have headaches, feel tense, or have nausea.  Urinate more than normal.  Have diarrhea. Get help right away if you have:  A racing heart and shortness of breath.  Thoughts of hurting yourself or others. If you ever feel like you may hurt yourself or others, or have thoughts about taking your own life, get help right away. You can go to your nearest emergency department or call:  Your local emergency services (911 in the [place]  A suicide crisis helpline, such as the [place] at 8166077734. This is open 24 hours a day. Summary  Taking steps to learn and use tension reduction techniques can help calm you and help prevent triggering an anxiety reaction.  When used together, medicines,  psychotherapy, and tension reduction techniques may be the most effective treatment.  Family, friends, and partners can play a big part in helping you recover from an anxiety disorder. This information is not intended to replace advice given to you by your health care provider. Make sure you discuss any questions you have with your health care provider. Document Revised: 05/02/2019 Document Reviewed: 05/02/2019 Elsevier Patient Education  2020  [employer].  If you have lab work done today you will be contacted with your lab results within the next 2 weeks.  If you have not heard from [place] then please contact us. The fastest way to get your results is to register for My Chart.   IF you received an x-ray today, you will receive an invoice from [facility]. Please contact [facility] at [phone] with questions or concerns regarding your invoice.   IF you received labwork today, you will receive an invoice from [place]. Please contact [facility] at [phone] with questions or concerns regarding your invoice.   Our billing staff will not be able to assist you with questions regarding bills from these companies.  You will be contacted with the lab results as soon as they are available. The fastest way to get your results is to activate your My Chart account. Instructions are located on the last page of this paperwork. If you have not heard from [place] regarding the results in 2 weeks, please contact this office.          Signed, Merri Ray, MD [facility]

## 2020-04-16 ENCOUNTER — Other Ambulatory Visit: Payer: Self-pay | Admitting: Family Medicine

## 2020-04-16 DIAGNOSIS — I10 Essential (primary) hypertension: Secondary | ICD-10-CM

## 2020-04-18 ENCOUNTER — Encounter: Payer: Self-pay | Admitting: Family Medicine

## 2020-04-18 ENCOUNTER — Other Ambulatory Visit: Payer: Self-pay

## 2020-04-18 ENCOUNTER — Ambulatory Visit: Payer: 59 | Admitting: Family Medicine

## 2020-04-18 VITALS — BP 150/82 | HR 81 | Temp 98.2°F | Ht 65.5 in | Wt 227.0 lb

## 2020-04-18 DIAGNOSIS — I10 Essential (primary) hypertension: Secondary | ICD-10-CM

## 2020-04-18 DIAGNOSIS — E785 Hyperlipidemia, unspecified: Secondary | ICD-10-CM

## 2020-04-18 DIAGNOSIS — F411 Generalized anxiety disorder: Secondary | ICD-10-CM | POA: Diagnosis not present

## 2020-04-18 DIAGNOSIS — L500 Allergic urticaria: Secondary | ICD-10-CM

## 2020-04-18 DIAGNOSIS — Z9189 Other specified personal risk factors, not elsewhere classified: Secondary | ICD-10-CM

## 2020-04-18 DIAGNOSIS — J45909 Unspecified asthma, uncomplicated: Secondary | ICD-10-CM

## 2020-04-18 DIAGNOSIS — Z889 Allergy status to unspecified drugs, medicaments and biological substances status: Secondary | ICD-10-CM

## 2020-04-18 DIAGNOSIS — F439 Reaction to severe stress, unspecified: Secondary | ICD-10-CM

## 2020-04-18 MED ORDER — SERTRALINE HCL 25 MG PO TABS: 25.0000 mg | ORAL_TABLET | Freq: Every day | ORAL | 1 refills | Status: DC

## 2020-04-18 MED ORDER — CETIRIZINE HCL 10 MG PO TABS: 10.0000 mg | ORAL_TABLET | Freq: Every day | ORAL | 1 refills | Status: DC

## 2020-04-18 MED ORDER — ATORVASTATIN CALCIUM 10 MG PO TABS: 10.0000 mg | ORAL_TABLET | Freq: Every day | ORAL | 1 refills | Status: DC

## 2020-04-18 MED ORDER — BUDESONIDE-FORMOTEROL FUMARATE 80-4.5 MCG/ACT IN AERO: 2.0000 | INHALATION_SPRAY | Freq: Two times a day (BID) | RESPIRATORY_TRACT | 1 refills | Status: DC

## 2020-04-18 MED ORDER — ALBUTEROL SULFATE HFA 108 (90 BASE) MCG/ACT IN AERS: INHALATION_SPRAY | RESPIRATORY_TRACT | 1 refills | Status: DC

## 2020-04-18 MED ORDER — AMLODIPINE BESYLATE 10 MG PO TABS: 10.0000 mg | ORAL_TABLET | Freq: Every day | ORAL | 1 refills | Status: DC

## 2020-04-18 MED ORDER — HYDROXYZINE HCL 10 MG PO TABS: 10.0000 mg | ORAL_TABLET | Freq: Three times a day (TID) | ORAL | 2 refills | Status: DC | PRN

## 2020-04-18 MED ORDER — HYDROCHLOROTHIAZIDE 12.5 MG PO CAPS: 12.5000 mg | ORAL_CAPSULE | Freq: Every day | ORAL | 1 refills | Status: DC

## 2020-04-18 NOTE — Patient Instructions (Addendum)
  Nurse visit in next week with your blood pressure machine to make sure it is accurate. No changes for now.   Recheck in 6 weeks for labs, review meds. Restart lipitor once per day.   Same dose zoloft for now, hydroxyzine if needed.   If you have lab work done today you will be contacted with your lab results within the next 2 weeks.  If you have not heard from [place] then please contact us. The fastest way to get your results is to register for My Chart.   IF you received an x-ray today, you will receive an invoice from [facility]. Please contact [facility] at [phone] with questions or concerns regarding your invoice.   IF you received labwork today, you will receive an invoice from [place]. Please contact [facility] at [phone] with questions or concerns regarding your invoice.   Our billing staff will not be able to assist you with questions regarding bills from these companies.  You will be contacted with the lab results as soon as they are available. The fastest way to get your results is to activate your My Chart account. Instructions are located on the last page of this paperwork. If you have not heard from [place] regarding the results in 2 weeks, please contact this office.

## 2020-04-18 NOTE — Progress Notes (Signed)
Subjective:  Patient ID: Nicole Reyes, female    DOB: January 01, 1956  Age: 64 y.o. MRN: [id number]  CC:  Chief Complaint  Patient presents with  . Anxiety    follow-up. pt states she feels better. pt reports she feels more comfortable with her anxiety since last visit now that she is taking her medication. pt reports her medication is working well with no known side effects.    HPI Myani Busam presents for  Anxiety: Discussed 4/15. Work stress. Prior hx of depression/anxiety. GAD7 score 9, PHQ 13. Coping techniques discussed. Started on zoloft 25mg  qd, suspected baseling Generalized Anxiety Disorder. hydroxyzine 10mg  as needed as well.  Feels better on new med. No new side effects.  Hydroxyzine at night, and in am for itching (allergic urticaria).  Doing ok - not sedated.   Work is doing better - dealing with stress better.   GAD 7 : Generalized Anxiety Score 04/18/2020 03/28/2020  Nervous, Anxious, on Edge 0 3  Control/stop worrying 0 0  Worry too much - different things 0 3  Trouble relaxing 2 3  Restless 0 0  Easily annoyed or irritable 0 0  Afraid - awful might happen 0 0  Total GAD 7 Score 2 9  Anxiety Difficulty - Somewhat difficult   Depression screen [facility] 2/9 04/18/2020 03/28/2020 09/29/2019 07/20/2019 06/29/2019  Decreased Interest 0 0 0 0 0  Down, Depressed, Hopeless 0 3 0 0 0  PHQ - 2 Score 0 3 0 0 0  Altered sleeping 3 3 - - -  Tired, decreased energy 1 3 - - -  Change in appetite 0 2 - - -  Feeling bad or failure about yourself  0 0 - - -  Trouble concentrating 0 2 - - -  Moving slowly or fidgety/restless 0 0 - - -  Suicidal thoughts 0 0 - - -  PHQ-9 Score 4 13 - - -  Difficult doing work/chores - - - - -   Hypertension: hctz 12.5mg  qd, norvasc 10mg  qd.  Home readings:120-130/70-80. Elevated when giving blood.   Normal at home.  BP Readings from Last 3 Encounters:  04/18/20 (!) 150/82  03/28/20 (!) 165/85  03/26/20 (!) 158/90   Lab Results    Component Value Date   CREATININE 0.75 09/29/2019   Asthma: Off symbicort past week, needs to pick up refill. Some wheezing at night off advair - controlled when on med. Albuterol once per week on symbicort.  Zyrtec as needed - not daily. Allergies stable.   Hyperlipidemia: Atorvastatin 10mg  qd. Ran out few months ago - did not call. No side effects on meds. On meds in October.  Lab Results  Component Value Date   CHOL 158 09/29/2019   HDL 49 09/29/2019   LDLCALC 92 09/29/2019   TRIG 89 09/29/2019   CHOLHDL 3.2 09/29/2019   Lab Results  Component Value Date   ALT 46 (H) 09/29/2019   AST 45 (H) 09/29/2019   GGT 45 07/20/2019   ALKPHOS 164 (H) 03/26/2020   BILITOT 0.6 09/29/2019        History Patient Active Problem List   Diagnosis Date Noted  . Acute otitis media 01/08/2019  . Sore throat 01/08/2019  . Osteoarthritis of right knee 08/31/2018  . Right knee DJD 08/31/2018  . Allergic reaction 05/23/2018  . Allergic urticaria 05/23/2018  . Perennial and seasonal allergic rhinitis 05/23/2018  . Allergic conjunctivitis 05/23/2018  . Food allergy 05/23/2018  . Carpal tunnel syndrome of left  wrist 01/25/2018  . 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 Dry eye syndrome 11/20/2017  . Cataracts, bilateral 11/20/2017  . 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 Obesity 01/25/2015  . Pure hypercholesterolemia 02/10/2014  . Moderate persistent asthma 12/19/2012  . Hypertensive disorder 12/19/2012   Past Medical History:  Diagnosis Date  . Allergy   . Anxiety   . Arthritis   . Asthma   . Hypertension    Past Surgical History:  Procedure Laterality Date  . COLONOSCOPY  06/29/2011   Normal  . COLONOSCOPY&ENDOSCOPY  1980's   PT UNSURE PLACE&MD WHO DID EXAMS=NORMAL  . DILATION AND CURETTAGE OF UTERUS    . LAPAROSCOPY     REMOVED SCARE TISSUE FROM OVARIES  . TOTAL KNEE ARTHROPLASTY Right 08/31/2018   Procedure: RIGHT  TOTAL KNEE ARTHROPLASTY;   Surgeon: Susa Day, MD;  Location: [facility];  Service: Orthopedics;  Laterality: Right;   Allergies  Allergen Reactions  . Peanut-Containing Drug Products Anaphylaxis    Also walnuts and almonds   . Shellfish Allergy Hives   Prior to Admission medications   Medication Sig Start Date End Date Taking? Authorizing Provider  ADVAIR [facility] [phone] MCG/ACT inhaler Please specify directions, refills and quantity 01/24/20  Yes Wendie Agreste, MD  albuterol [facility] HFA) 108 (90 Base) MCG/ACT inhaler TAKE 2 PUFFS BY MOUTH EVERY 6 HOURS AS NEEDED FOR WHEEZE 04/10/19  Yes McVey, Gelene Mink, PA-C  amLODipine (NORVASC) 10 MG tablet TAKE 1 TABLET BY MOUTH EVERY DAY 04/16/20  Yes Wendie Agreste, MD  atorvastatin (LIPITOR) 10 MG tablet Take 1 tablet (10 mg total) by mouth daily. 09/29/19  Yes Wendie Agreste, MD  budesonide-formoterol [facility]) 80-4.5 MCG/ACT inhaler Inhale 2 puffs into the lungs 2 (two) times daily.   Yes [provider]  cetirizine (ZYRTEC) 10 MG tablet Take 1 tablet (10 mg total) by mouth daily. 09/29/19  Yes Wendie Agreste, MD  EPINEPHrine 0.3 mg/0.3 mL IJ SOAJ injection Inject 0.3 mLs (0.3 mg total) into the muscle as needed for anaphylaxis. 03/28/20  Yes Wendie Agreste, MD  hydrochlorothiazide (MICROZIDE) 12.5 MG capsule Take 1 capsule (12.5 mg total) by mouth daily. 09/29/19  Yes Wendie Agreste, MD  hydrOXYzine (ATARAX/VISTARIL) 10 MG tablet Take 1 tablet (10 mg total) by mouth 3 (three) times daily as needed. 03/28/20  Yes Wendie Agreste, MD  nystatin cream (MYCOSTATIN) nystatin 100,000 unit/gram topical cream  APPLY TOPICALLY 2 (TWO) TIMES DAILY.   Yes [provider]  sertraline (ZOLOFT) 25 MG tablet Take 1 tablet (25 mg total) by mouth daily. 03/28/20  Yes Wendie Agreste, MD   Social History   Socioeconomic History  . Marital status: Single    Spouse name: n/a  . Number of children: 0  . Years of education: Not on file  . Highest  education level: Not on file  Occupational History  . Occupation: employed    Fish farm manager: [employer]  Tobacco Use  . Smoking status: Never Smoker  . Smokeless tobacco: Never Used  Substance and Sexual Activity  . Alcohol use: No    Alcohol/week: 0.0 standard drinks  . Drug use: No  . Sexual activity: Yes    Birth control/protection: Post-menopausal  Other Topics Concern  . Not on file  Social History Narrative   Marital status:  Single; not dating      Children: none      Lives: alone  Employment:  Museum/gallery curator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      Tobacco: none      Alcohol: none      Exercise:  Four days per week.   Social Determinants of Health   Financial Resource Strain:   . Difficulty of Paying Living Expenses:   Food Insecurity:   . Worried About Charity fundraiser in the Last Year:   . Arboriculturist in the Last Year:   Transportation Needs:   . Film/video editor (Medical):   Marland Kitchen Lack of Transportation (Non-Medical):   Physical Activity:   . Days of Exercise per Week:   . Minutes of Exercise per Session:   Stress:   . Feeling of Stress :   Social Connections:   . Frequency of Communication with Friends and Family:   . Frequency of Social Gatherings with Friends and Family:   . Attends Religious Services:   . Active Member of Clubs or Organizations:   . Attends Archivist Meetings:   Marland Kitchen Marital Status:   Intimate Partner Violence:   . Fear of Current or Ex-Partner:   . Emotionally Abused:   Marland Kitchen Physically Abused:   . Sexually Abused:     Review of Systems  Constitutional: Negative for fatigue and unexpected weight change.  Respiratory: Negative for chest tightness and shortness of breath.   Cardiovascular: Negative for chest pain, palpitations and leg swelling.  Gastrointestinal: Negative for abdominal pain and blood in stool.  Neurological: Negative for dizziness, syncope, light-headedness and headaches.     Objective:   Vitals:   04/18/20  0958 04/18/20 1009  BP: (!) 165/92 (!) 150/82  Pulse: 81   Temp: 98.2 F (36.8 C)   TempSrc: Temporal   SpO2: 100%   Weight: 227 lb (103 kg)   Height: 5' 5.5" (1.664 m)      Physical Exam Vitals reviewed.  Constitutional:      Appearance: She is well-developed.  HENT:     Head: Normocephalic and atraumatic.  Eyes:     Conjunctiva/sclera: Conjunctivae normal.     Pupils: Pupils are equal, round, and reactive to light.  Neck:     Vascular: No carotid bruit.  Cardiovascular:     Rate and Rhythm: Normal rate and regular rhythm.     Heart sounds: Normal heart sounds.  Pulmonary:     Effort: Pulmonary effort is normal.     Breath sounds: Normal breath sounds.  Abdominal:     Palpations: Abdomen is soft. There is no pulsatile mass.     Tenderness: There is no abdominal tenderness.  Skin:    General: Skin is warm and dry.  Neurological:     Mental Status: She is alert and oriented to person, place, and time.  Psychiatric:        Behavior: Behavior normal.        Assessment & Plan:  Elfa Depass is a 64 y.o. female . Hyperlipidemia, unspecified hyperlipidemia type - Plan: Lipid panel, Comprehensive metabolic panel, atorvastatin (LIPITOR) 10 MG tablet  - off meds. Check baseline lab, restart lipitor, 6 week recheck - borderline lfts' prior.   Essential hypertension - Plan: Lipid panel, Comprehensive metabolic panel, amLODipine (NORVASC) 10 MG tablet, hydrochlorothiazide (MICROZIDE) 12.5 MG capsule  - possible white coat component. Nurse visit in week with her monitor  Generalized anxiety disorder - Plan: sertraline (ZOLOFT) 25 MG tablet, hydrOXYzine (ATARAX/VISTARIL) 10 MG tablet  -improved. Cont zoloft, hydroxyzine if needed.   Uncomplicated asthma,  unspecified asthma severity, unspecified whether persistent - Plan: albuterol (PROAIR HFA) 108 (90 Base) MCG/ACT inhaler, budesonide-formoterol (SYMBICORT) 80-4.5 MCG/ACT inhaler Extrinsic asthma without complication,  unspecified asthma severity, unspecified whether persistent - Plan: budesonide-formoterol (SYMBICORT) 80-4.5 MCG/ACT inhaler  - inc nighttime symptoms off Symbicort.  Importance of medication adherence discussed.  Refilled, as well as albuterol.  Stable when on meds.  Situational stress - Plan: hydrOXYzine (ATARAX/VISTARIL) 10 MG tablet  -As above  Allergic urticaria - Plan: hydrOXYzine (ATARAX/VISTARIL) 10 MG tablet, cetirizine (ZYRTEC) 10 MG tablet  -Stable, continue Zyrtec, hydroxyzine if needed.  H/O multiple allergies - Plan: cetirizine (ZYRTEC) 10 MG tablet   Meds ordered this encounter  Medications  . sertraline (ZOLOFT) 25 MG tablet    Sig: Take 1 tablet (25 mg total) by mouth daily.    Dispense:  90 tablet    Refill:  1  . albuterol (PROAIR HFA) 108 (90 Base) MCG/ACT inhaler    Sig: TAKE 2 PUFFS BY MOUTH EVERY 6 HOURS AS NEEDED FOR WHEEZE    Dispense:  18 g    Refill:  1  . amLODipine (NORVASC) 10 MG tablet    Sig: Take 1 tablet (10 mg total) by mouth daily.    Dispense:  90 tablet    Refill:  1  . hydrOXYzine (ATARAX/VISTARIL) 10 MG tablet    Sig: Take 1 tablet (10 mg total) by mouth 3 (three) times daily as needed.    Dispense:  30 tablet    Refill:  2  . atorvastatin (LIPITOR) 10 MG tablet    Sig: Take 1 tablet (10 mg total) by mouth daily.    Dispense:  90 tablet    Refill:  1  . cetirizine (ZYRTEC) 10 MG tablet    Sig: Take 1 tablet (10 mg total) by mouth daily.    Dispense:  90 tablet    Refill:  1  . hydrochlorothiazide (MICROZIDE) 12.5 MG capsule    Sig: Take 1 capsule (12.5 mg total) by mouth daily.    Dispense:  90 capsule    Refill:  1  . budesonide-formoterol (SYMBICORT) 80-4.5 MCG/ACT inhaler    Sig: Inhale 2 puffs into the lungs 2 (two) times daily.    Dispense:  3 Inhaler    Refill:  1   Patient Instructions    Nurse visit in next week with your blood pressure machine to make sure it is accurate. No changes for now.   Recheck in 6 weeks for  labs, review meds. Restart lipitor once per day.   Same dose zoloft for now, hydroxyzine if needed.   If you have lab work done today you will be contacted with your lab results within the next 2 weeks.  If you have not heard from [place] then please contact us. The fastest way to get your results is to register for My Chart.   IF you received an x-ray today, you will receive an invoice from [facility]. Please contact [facility] at [phone] with questions or concerns regarding your invoice.   IF you received labwork today, you will receive an invoice from [place]. Please contact [facility] at [phone] with questions or concerns regarding your invoice.   Our billing staff will not be able to assist you with questions regarding bills from these companies.  You will be contacted with the lab results as soon as they are available. The fastest way to get your results is to activate your My Chart account. Instructions are located on  the last page of this paperwork. If you have not heard from [place] regarding the results in 2 weeks, please contact this office.         Signed, Merri Ray, MD [facility]

## 2020-04-19 LAB — LIPID PANEL
Chol/HDL Ratio: 2.8 ratio (ref 0.0–4.4)
Cholesterol, Total: 173 mg/dL (ref 100–199)
HDL: 61 mg/dL (ref >39)
LDL Chol Calc (NIH): 99 mg/dL (ref 0–99)
Triglycerides: 65 mg/dL (ref 0–149)
VLDL Cholesterol Cal: 13 mg/dL (ref 5–40)

## 2020-04-19 LAB — COMPREHENSIVE METABOLIC PANEL
ALT: 13 IU/L (ref 0–32)
AST: 14 IU/L (ref 0–40)
Albumin/Globulin Ratio: 1.8 (ref 1.2–2.2)
Albumin: 4.3 g/dL (ref 3.8–4.8)
Alkaline Phosphatase: 86 IU/L (ref 39–117)
BUN/Creatinine Ratio: 13 (ref 12–28)
BUN: 10 mg/dL (ref 8–27)
Bilirubin Total: 1 mg/dL (ref 0.0–1.2)
CO2: 21 mmol/L (ref 20–29)
Calcium: 9.3 mg/dL (ref 8.7–10.3)
Chloride: 106 mmol/L (ref 96–106)
Creatinine, Ser: 0.77 mg/dL (ref 0.57–1.00)
GFR calc Af Amer: 95 mL/min/{1.73_m2} (ref >59)
GFR calc non Af Amer: 82 mL/min/{1.73_m2} (ref >59)
Globulin, Total: 2.4 g/dL (ref 1.5–4.5)
Glucose: 87 mg/dL (ref 65–99)
Potassium: 4.5 mmol/L (ref 3.5–5.2)
Sodium: 141 mmol/L (ref 134–144)
Total Protein: 6.7 g/dL (ref 6.0–8.5)

## 2020-04-25 ENCOUNTER — Ambulatory Visit (INDEPENDENT_AMBULATORY_CARE_PROVIDER_SITE_OTHER): Payer: 59 | Admitting: Family Medicine

## 2020-04-25 ENCOUNTER — Other Ambulatory Visit: Payer: Self-pay

## 2020-04-25 ENCOUNTER — Encounter: Payer: Self-pay | Admitting: Family Medicine

## 2020-04-25 VITALS — BP 183/94 | HR 78

## 2020-04-25 DIAGNOSIS — Z0130 Encounter for examination of blood pressure without abnormal findings: Secondary | ICD-10-CM

## 2020-04-25 NOTE — Progress Notes (Signed)
Possible whitecoat component on blood pressure when discussed May 6.  Nurse visit today with her wrist monitor, read 159/105, in office monitor 183/94.  Previous prescription of hydrochlorothiazide 12.5 mg daily, amlodipine 10 mg daily.  Taking hctz. Has forgotten to take amlodipine past 3 days. BP 123XX123 systolic yesterday.  She thinks had just taken amlodipine in past - not HCTZ in past few months.   Will restart amlodipine at 1/2 pill per day (5mg ) with the hydrochlorothiazide 12.5 mg.  Repeat nurse visit in 1 week.

## 2020-04-25 NOTE — Patient Instructions (Signed)
Continue hydrochlorothiazide, start amlodipine 1/2 pill/day.  Monitor your blood pressure readings twice per day and keep a record for your next visit in 1 week.  If any lightheadedness, dizziness or low blood pressure stop amlodipine.  If blood pressure is remaining above 140/90 going into next week, you can take a full pill of the amlodipine per day along with your hydrochlorothiazide. Return to the clinic or go to the nearest emergency room if any of your symptoms worsen or new symptoms occur.

## 2020-04-30 ENCOUNTER — Encounter: Payer: Self-pay | Admitting: Radiology

## 2020-05-02 ENCOUNTER — Other Ambulatory Visit: Payer: Self-pay

## 2020-05-02 ENCOUNTER — Ambulatory Visit (INDEPENDENT_AMBULATORY_CARE_PROVIDER_SITE_OTHER): Payer: 59 | Admitting: Family Medicine

## 2020-05-02 VITALS — BP 145/84 | Ht 65.5 in | Wt 227.0 lb

## 2020-05-02 DIAGNOSIS — I10 Essential (primary) hypertension: Secondary | ICD-10-CM

## 2020-05-02 NOTE — Progress Notes (Signed)
Lab only visit - pt not seen.

## 2020-05-02 NOTE — Progress Notes (Signed)
Nurse visit for BP check  Blood Pressure right arm/sitting/large cuff 145/84

## 2020-05-30 ENCOUNTER — Ambulatory Visit: Payer: 59 | Admitting: Family Medicine

## 2020-06-03 ENCOUNTER — Other Ambulatory Visit: Payer: Self-pay

## 2020-06-03 ENCOUNTER — Encounter: Payer: Self-pay | Admitting: Family Medicine

## 2020-06-03 ENCOUNTER — Ambulatory Visit: Payer: 59 | Admitting: Family Medicine

## 2020-06-03 VITALS — BP 168/104 | HR 95 | Temp 98.1°F | Ht 65.5 in | Wt 225.0 lb

## 2020-06-03 DIAGNOSIS — M25562 Pain in left knee: Secondary | ICD-10-CM

## 2020-06-03 DIAGNOSIS — R519 Headache, unspecified: Secondary | ICD-10-CM | POA: Diagnosis not present

## 2020-06-03 DIAGNOSIS — G8929 Other chronic pain: Secondary | ICD-10-CM

## 2020-06-03 DIAGNOSIS — I10 Essential (primary) hypertension: Secondary | ICD-10-CM

## 2020-06-03 MED ORDER — HYDROCHLOROTHIAZIDE 25 MG PO TABS: 25.0000 mg | ORAL_TABLET | Freq: Every day | ORAL | 1 refills | Status: DC

## 2020-06-03 NOTE — Patient Instructions (Addendum)
  Increase hctz to 25mg  per day (can double the 12.5mg  until new prescription picked up).  Continue same dose of amlodipine for now.   Follow up with orthopaedics about your knee pain. Ok to try tylenol few times per day to see if that can help pain and may lessen need for alleve.   Please discuss your elbow pain with your employer as you may need to be seen by workers comp provider.     If you have lab work done today you will be contacted with your lab results within the next 2 weeks.  If you have not heard from [place] then please contact us. The fastest way to get your results is to register for My Chart.   IF you received an x-ray today, you will receive an invoice from [facility]. Please contact [facility] at [phone] with questions or concerns regarding your invoice.   IF you received labwork today, you will receive an invoice from [place]. Please contact [facility] at [phone] with questions or concerns regarding your invoice.   Our billing staff will not be able to assist you with questions regarding bills from these companies.  You will be contacted with the lab results as soon as they are available. The fastest way to get your results is to activate your My Chart account. Instructions are located on the last page of this paperwork. If you have not heard from [place] regarding the results in 2 weeks, please contact this office.

## 2020-06-03 NOTE — Progress Notes (Signed)
Subjective:  Patient ID: Nicole Reyes, female    DOB: August 13, 1956  Age: 64 y.o. MRN: [id number]  CC:  Chief Complaint  Patient presents with  . Hypertension    pt reports no physical issues with her BP sice last OV. pt has been having the nurse as her work check the BP once a week and ith has been runing around the same level as todays reading of 157/98. pt reports she sometimes waks up with slight headache.  . Temporomandibular Joint Pain    pt reports having pain in her L knee and R elbow. pt reports she knowns she needs a knee replacement and thats why the knee hurts because she has been holding off on the surgery. pt reports she has hit the elbow on a machine at work mutiple times through out the years and it's starting to hurt more and more.    HPI Nicole Reyes presents for   Hypertension: Follow up 04/25/20 visit.  Had not taken amlodipine at that visit, question about whether she had been taking HCTZ.  Restarted amlodipine at 5 mg daily, hydrochlorothiazide 12.5 mg daily, nurse visit May 20, blood pressure 145/84. No missed doses, taking both once per day. No new side effects with meds. Slight HA at times. Taking full 10mg  amlodipine.  Some whitecoat htn.  Home readings:138-150/78-80's.  BP Readings from Last 3 Encounters:  06/03/20 (!) 168/104  05/02/20 (!) 145/84  04/25/20 (!) 183/94   Lab Results  Component Value Date   CREATININE 0.77 04/18/2020   L knee pain: Longstanding knee pain. Needs a knee replacement.  Followed by Dr. Marlou Sa. Plans to meet with Dr. Marlou Sa to discuss replacement. Prior injection before covid pandemic helped.  Tx: alleve every other day.  Same pain, no new injuries. Wears a brace.   R elbow pain: Past 5-6 months, inside of elbow. Noticed after hitting area on a piece of metal on the job. Advised her employer, did not see a medical provider.  Still sore.      History Patient Active Problem List   Diagnosis Date Noted  . Acute  otitis media 01/08/2019  . Sore throat 01/08/2019  . Osteoarthritis of right knee 08/31/2018  . Right knee DJD 08/31/2018  . Allergic reaction 05/23/2018  . Allergic urticaria 05/23/2018  . Perennial and seasonal allergic rhinitis 05/23/2018  . Allergic conjunctivitis 05/23/2018  . Food allergy 05/23/2018  . Carpal tunnel syndrome of left wrist 01/25/2018  . 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 Dry eye syndrome 11/20/2017  . Cataracts, bilateral 11/20/2017  . 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 Obesity 01/25/2015  . Pure hypercholesterolemia 02/10/2014  . Moderate persistent asthma 12/19/2012  . Hypertensive disorder 12/19/2012   Past Medical History:  Diagnosis Date  . Allergy   . Anxiety   . Arthritis   . Asthma   . Hypertension    Past Surgical History:  Procedure Laterality Date  . COLONOSCOPY  06/29/2011   Normal  . COLONOSCOPY&ENDOSCOPY  1980's   PT UNSURE PLACE&MD WHO DID EXAMS=NORMAL  . DILATION AND CURETTAGE OF UTERUS    . LAPAROSCOPY     REMOVED SCARE TISSUE FROM OVARIES  . TOTAL KNEE ARTHROPLASTY Right 08/31/2018   Procedure: RIGHT  TOTAL KNEE ARTHROPLASTY;  Surgeon: Susa Day, MD;  Location: [facility];  Service: Orthopedics;  Laterality: Right;   Allergies  Allergen Reactions  . Peanut-Containing Drug Products Anaphylaxis    Also walnuts and almonds   .  Shellfish Allergy Hives   Prior to Admission medications   Medication Sig Start Date End Date Taking? Authorizing Provider  albuterol (PROAIR HFA) 108 (90 Base) MCG/ACT inhaler TAKE 2 PUFFS BY MOUTH EVERY 6 HOURS AS NEEDED FOR WHEEZE 04/18/20  Yes Wendie Agreste, MD  amLODipine (NORVASC) 10 MG tablet Take 1 tablet (10 mg total) by mouth daily. 04/18/20  Yes Wendie Agreste, MD  atorvastatin (LIPITOR) 10 MG tablet Take 1 tablet (10 mg total) by mouth daily. 04/18/20  Yes Wendie Agreste, MD  budesonide-formoterol [facility]) 80-4.5 MCG/ACT inhaler Inhale 2 puffs into the  lungs 2 (two) times daily. 04/18/20  Yes Wendie Agreste, MD  cetirizine (ZYRTEC) 10 MG tablet Take 1 tablet (10 mg total) by mouth daily. 04/18/20  Yes Wendie Agreste, MD  EPINEPHrine 0.3 mg/0.3 mL IJ SOAJ injection Inject 0.3 mLs (0.3 mg total) into the muscle as needed for anaphylaxis. 03/28/20  Yes Wendie Agreste, MD  hydrochlorothiazide (MICROZIDE) 12.5 MG capsule Take 1 capsule (12.5 mg total) by mouth daily. 04/18/20  Yes Wendie Agreste, MD  hydrOXYzine (ATARAX/VISTARIL) 10 MG tablet Take 1 tablet (10 mg total) by mouth 3 (three) times daily as needed. 04/18/20  Yes Wendie Agreste, MD  nystatin cream (MYCOSTATIN) nystatin 100,000 unit/gram topical cream  APPLY TOPICALLY 2 (TWO) TIMES DAILY.   Yes [provider]  sertraline (ZOLOFT) 25 MG tablet Take 1 tablet (25 mg total) by mouth daily. 04/18/20  Yes Wendie Agreste, MD   Social History   Socioeconomic History  . Marital status: Single    Spouse name: n/a  . Number of children: 0  . Years of education: Not on file  . Highest education level: Not on file  Occupational History  . Occupation: employed    Fish farm manager: [employer]  Tobacco Use  . Smoking status: Never Smoker  . Smokeless tobacco: Never Used  Vaping Use  . Vaping Use: Never used  Substance and Sexual Activity  . Alcohol use: No    Alcohol/week: 0.0 standard drinks  . Drug use: No  . Sexual activity: Yes    Birth control/protection: Post-menopausal  Other Topics Concern  . 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      Tobacco: none      Alcohol: none      Exercise:  Four days per week.   Social Determinants of Health   Financial Resource Strain:   . Difficulty of Paying Living Expenses:   Food Insecurity:   . Worried About Charity fundraiser in the Last Year:   . Arboriculturist in the Last Year:   Transportation  Needs:   . Film/video editor (Medical):   Marland Kitchen Lack of Transportation (Non-Medical):   Physical Activity:   . Days of Exercise per Week:   . Minutes of Exercise per Session:   Stress:   . Feeling of Stress :   Social Connections:   . Frequency of Communication with Friends and Family:   . Frequency of Social Gatherings with Friends and Family:   . Attends Religious Services:   . Active Member of Clubs or Organizations:   . Attends Archivist Meetings:   Marland Kitchen Marital Status:   Intimate Partner Violence:   . Fear  of Current or Ex-Partner:   . Emotionally Abused:   Marland Kitchen Physically Abused:   . Sexually Abused:     Review of Systems  Constitutional: Negative for fatigue and unexpected weight change.  Respiratory: Negative for chest tightness and shortness of breath.   Cardiovascular: Negative for chest pain, palpitations and leg swelling.  Gastrointestinal: Negative for abdominal pain and blood in stool.  Neurological: Positive for headaches (as above - comes and goes. ). Negative for dizziness, syncope, facial asymmetry, weakness and light-headedness.     Objective:   Vitals:   06/03/20 1128 06/03/20 1130  BP: (!) 157/98 (!) 168/104  Pulse: 95   Temp: 98.1 F (36.7 C)   TempSrc: Temporal   SpO2: 98%   Weight: 225 lb (102.1 kg)   Height: 5' 5.5" (1.664 m)      Physical Exam Vitals reviewed.  Constitutional:      Appearance: She is well-developed.  HENT:     Head: Normocephalic and atraumatic.  Eyes:     Conjunctiva/sclera: Conjunctivae normal.     Pupils: Pupils are equal, round, and reactive to light.  Neck:     Vascular: No carotid bruit.  Cardiovascular:     Rate and Rhythm: Normal rate and regular rhythm.     Heart sounds: Normal heart sounds.  Pulmonary:     Effort: Pulmonary effort is normal.     Breath sounds: Normal breath sounds.  Abdominal:     Palpations: Abdomen is soft. There is no pulsatile mass.     Tenderness: There is no abdominal  tenderness.  Musculoskeletal:     Comments: Equal grip strength, full range of motion of the left extremities bilaterally, slight tenderness at the medial epicondyle of the right elbow.  Skin intact, no apparent soft tissue swelling.  Left knee overall intact range of motion with seated exam, no apparent effusion.  Slight bony tenderness on medial lateral joint lines greater than patellofemoral joint.  Able ambulate without assistance.  Skin:    General: Skin is warm and dry.  Neurological:     General: No focal deficit present.     Mental Status: She is alert and oriented to person, place, and time.     Cranial Nerves: No cranial nerve deficit.     Sensory: No sensory deficit.     Motor: No weakness.     Coordination: Coordination normal.     Gait: Gait normal.  Psychiatric:        Mood and Affect: Mood normal.        Behavior: Behavior normal.      Assessment & Plan:  Nicole Reyes is a 64 y.o. female . Essential hypertension - Plan: hydrochlorothiazide (HYDRODIURIL) 25 MG tablet  -Still suspect possible whitecoat component but still borderline elevated readings at home, elevated in office, and episodic headaches may be related to blood pressure elevations.  Previous lab work reassuring.  We will continue amlodipine, she has been taking 10 mg dose.  Increase hydrochlorothiazide to 25 mg daily, potential side effects discussed.  Recheck in 6 weeks.  Orthostatic precautions given.  Nonintractable episodic headache, unspecified headache type  -Nonfocal neurologic exam, tension versus headache with elevations in blood pressure.  Should improve with changes above if that is the case.  RTC precautions, 6-week recheck  Chronic pain of left knee  -End-stage arthritis per her report.  She does plan on following up with her orthopedist to discuss treatment options.  Denies recent changes.  Tylenol over-the-counter discussed to potentially  decrease need for NSAIDs.  Right elbow pain,  medial component, she plans to follow-up with her employer, possible Worker's Comp. evaluation.  Meds ordered this encounter  Medications  . hydrochlorothiazide (HYDRODIURIL) 25 MG tablet    Sig: Take 1 tablet (25 mg total) by mouth daily.    Dispense:  90 tablet    Refill:  1    Ok to change to capsule 25mg  if needed for insurance coverage.   Patient Instructions    Increase hctz to 25mg  per day (can double the 12.5mg  until new prescription picked up).  Continue same dose of amlodipine for now.   Follow up with orthopaedics about your knee pain. Ok to try tylenol few times per day to see if that can help pain and may lessen need for alleve.   Please discuss your elbow pain with your employer as you may need to be seen by workers comp provider.     If you have lab work done today you will be contacted with your lab results within the next 2 weeks.  If you have not heard from [place] then please contact us. The fastest way to get your results is to register for My Chart.   IF you received an x-ray today, you will receive an invoice from [facility]. Please contact [facility] at [phone] with questions or concerns regarding your invoice.   IF you received labwork today, you will receive an invoice from [place]. Please contact [facility] at [phone] with questions or concerns regarding your invoice.   Our billing staff will not be able to assist you with questions regarding bills from these companies.  You will be contacted with the lab results as soon as they are available. The fastest way to get your results is to activate your My Chart account. Instructions are located on the last page of this paperwork. If you have not heard from [place] regarding the results in 2 weeks, please contact this office.         Signed, Merri Ray, MD [facility]

## 2020-06-11 DIAGNOSIS — Z96651 Presence of right artificial knee joint: Secondary | ICD-10-CM | POA: Insufficient documentation

## 2020-07-15 ENCOUNTER — Ambulatory Visit: Payer: 59 | Admitting: Family Medicine

## 2020-07-19 ENCOUNTER — Encounter: Payer: Self-pay | Admitting: Registered Nurse

## 2020-07-19 ENCOUNTER — Other Ambulatory Visit: Payer: Self-pay

## 2020-07-19 ENCOUNTER — Ambulatory Visit: Payer: 59 | Admitting: Registered Nurse

## 2020-07-19 VITALS — BP 166/92 | HR 82 | Temp 97.5°F | Resp 18 | Ht 65.5 in | Wt 225.0 lb

## 2020-07-19 DIAGNOSIS — Z01818 Encounter for other preprocedural examination: Secondary | ICD-10-CM | POA: Diagnosis not present

## 2020-07-19 DIAGNOSIS — I10 Essential (primary) hypertension: Secondary | ICD-10-CM | POA: Diagnosis not present

## 2020-07-19 MED ORDER — LISINOPRIL-HYDROCHLOROTHIAZIDE 20-25 MG PO TABS: 1.0000 | ORAL_TABLET | Freq: Every day | ORAL | 1 refills | Status: DC

## 2020-07-19 NOTE — Patient Instructions (Signed)
° ° ° °  If you have lab work done today you will be contacted with your lab results within the next 2 weeks.  If you have not heard from us then please contact us. The fastest way to get your results is to register for My Chart. ° ° °IF you received an x-ray today, you will receive an invoice from [facility]. Please contact [facility] at [phone] with questions or concerns regarding your invoice.  ° °IF you received labwork today, you will receive an invoice from [facility]. Please contact [facility] at [phone] with questions or concerns regarding your invoice.  ° °Our billing staff will not be able to assist you with questions regarding bills from these companies. ° °You will be contacted with the lab results as soon as they are available. The fastest way to get your results is to activate your My Chart account. Instructions are located on the last page of this paperwork. If you have not heard from us regarding the results in 2 weeks, please contact this office. °  ° ° ° °

## 2020-07-20 LAB — URINALYSIS
Bilirubin, UA: NEGATIVE
Glucose, UA: NEGATIVE
Ketones, UA: NEGATIVE
Leukocytes,UA: NEGATIVE
Nitrite, UA: NEGATIVE
Protein,UA: NEGATIVE
RBC, UA: NEGATIVE
Specific Gravity, UA: 1.021 (ref 1.005–1.030)
Urobilinogen, Ur: 0.2 mg/dL (ref 0.2–1.0)
pH, UA: 7 (ref 5.0–7.5)

## 2020-07-20 LAB — CBC WITH DIFFERENTIAL
Basophils Absolute: 0.1 10*3/uL (ref 0.0–0.2)
Basos: 1 %
EOS (ABSOLUTE): 0.3 10*3/uL (ref 0.0–0.4)
Eos: 4 %
Hematocrit: 40.4 % (ref 34.0–46.6)
Hemoglobin: 13.6 g/dL (ref 11.1–15.9)
Immature Grans (Abs): 0 10*3/uL (ref 0.0–0.1)
Immature Granulocytes: 0 %
Lymphocytes Absolute: 2.7 10*3/uL (ref 0.7–3.1)
Lymphs: 43 %
MCH: 27.1 pg (ref 26.6–33.0)
MCHC: 33.7 g/dL (ref 31.5–35.7)
MCV: 81 fL (ref 79–97)
Monocytes Absolute: 0.4 10*3/uL (ref 0.1–0.9)
Monocytes: 7 %
Neutrophils Absolute: 2.8 10*3/uL (ref 1.4–7.0)
Neutrophils: 45 %
RBC: 5.02 x10E6/uL (ref 3.77–5.28)
RDW: 12.7 % (ref 11.7–15.4)
WBC: 6.2 10*3/uL (ref 3.4–10.8)

## 2020-07-20 LAB — COMPREHENSIVE METABOLIC PANEL
ALT: 34 IU/L — ABNORMAL HIGH (ref 0–32)
AST: 43 IU/L — ABNORMAL HIGH (ref 0–40)
Albumin/Globulin Ratio: 1.5 (ref 1.2–2.2)
Albumin: 4.7 g/dL (ref 3.8–4.8)
Alkaline Phosphatase: 142 IU/L — ABNORMAL HIGH (ref 48–121)
BUN/Creatinine Ratio: 15 (ref 12–28)
BUN: 12 mg/dL (ref 8–27)
Bilirubin Total: 0.6 mg/dL (ref 0.0–1.2)
CO2: 25 mmol/L (ref 20–29)
Calcium: 9.4 mg/dL (ref 8.7–10.3)
Chloride: 102 mmol/L (ref 96–106)
Creatinine, Ser: 0.81 mg/dL (ref 0.57–1.00)
GFR calc Af Amer: 89 mL/min/{1.73_m2} (ref >59)
GFR calc non Af Amer: 78 mL/min/{1.73_m2} (ref >59)
Globulin, Total: 3.1 g/dL (ref 1.5–4.5)
Glucose: 94 mg/dL (ref 65–99)
Potassium: 3.9 mmol/L (ref 3.5–5.2)
Sodium: 138 mmol/L (ref 134–144)
Total Protein: 7.8 g/dL (ref 6.0–8.5)

## 2020-07-20 LAB — PROTIME-INR
INR: 1 (ref 0.9–1.2)
Prothrombin Time: 10.9 s (ref 9.1–12.0)

## 2020-07-20 LAB — APTT: aPTT: 25 s (ref 24–33)

## 2020-07-20 LAB — HEMOGLOBIN A1C
Est. average glucose Bld gHb Est-mCnc: 108 mg/dL
Hgb A1c MFr Bld: 5.4 % (ref 4.8–5.6)

## 2020-07-20 LAB — TSH: TSH: 1.13 u[IU]/mL (ref 0.450–4.500)

## 2020-07-25 ENCOUNTER — Telehealth: Payer: Self-pay | Admitting: Family Medicine

## 2020-07-25 NOTE — Telephone Encounter (Signed)
Pt was advised by the pharmacy to ask PCP if she needs to stop taking any of her other BP meds since she has picked up her Rx for lisinopril-hydrochlorothiazide (ZESTORETIC) 20-25 MG tablet / please advise if she should continue to take hydrochlorothiazide (HYDRODIURIL) 25 MG tablet And amLODipine (NORVASC) 10 MG tablet

## 2020-07-26 NOTE — Telephone Encounter (Signed)
Pt saw you in June you added HCTZ and wanted her on Amlodipine pt saw [facility] for preop clearance visit a few days ago and he ordered combo HCTZ lisinopril but no notes on why or if she was to stop her other BP meds or continue one of them, pt is unsure what to do what should I advise?

## 2020-07-26 NOTE — Telephone Encounter (Signed)
I assume he meant to add medication as her blood pressure was elevated.  I will forward this note to Rich to clarify.  I have also placed the preoperative clearance paperwork in Rich's box.  Please let me know if there are questions or if I can help further.

## 2020-07-29 NOTE — Telephone Encounter (Signed)
Attempted to call pt back. No answer so I left a message to call back.

## 2020-07-30 ENCOUNTER — Telehealth: Payer: Self-pay | Admitting: Family Medicine

## 2020-07-30 NOTE — Telephone Encounter (Signed)
Note has been routed to [employer] as well. I can assist with completion of form depending on his time of return. Let me know.

## 2020-07-30 NOTE — Telephone Encounter (Signed)
Nicole Reyes called in checking on a fax from [facility] concerning a knee replacement. She saw Orland Mustard on 07/19/20 for a Preoperative exam. She is a Nicole Reyes Nicole Reyes. I found the paperwork in [place] urgent box in the doctor's lounge. She is needing this form asap, so she can have her replacement scheduled. Please advise at [phone].

## 2020-07-30 NOTE — Telephone Encounter (Signed)
Dr [name] seen this patient on 07/19/2020 and patient was in office for Surgical Clearance, and we also didn't see the paperwork at the time. I have now located the paperwork, but Nicole Reyes is on vacation and I do not see a note stating that the patient is clear for surgery and also the patient Blood pressure was elevated. Patient states she usually come to you for primary care instead of [facility].  Please Advise

## 2020-07-30 NOTE — Telephone Encounter (Signed)
Do either of you know anything about this form and what part of the process the form is in? Please advise

## 2020-08-09 NOTE — Telephone Encounter (Signed)
Patient should be taking the combination pill rather than the HCTZ on it's own Additionally her paperwork has been completed and faxed back to surgeon's office  Thanks,  Kathrin Ruddy, NP

## 2020-08-09 NOTE — Telephone Encounter (Signed)
I have called pt back and clarified that she is to be on the combo pill and not the HCTZ. I have also let her know that the forms have been completed and faxed.   She stated understanding.

## 2020-09-04 ENCOUNTER — Other Ambulatory Visit: Payer: Self-pay | Admitting: Orthopedic Surgery

## 2020-09-04 ENCOUNTER — Ambulatory Visit: Payer: Self-pay | Admitting: Orthopedic Surgery

## 2020-09-11 ENCOUNTER — Encounter: Payer: Self-pay | Admitting: Registered Nurse

## 2020-09-11 NOTE — Progress Notes (Signed)
Established Patient Office Visit  Subjective:  Patient ID: Nicole Reyes, female    DOB: 02-10-1956  Age: 64 y.o. MRN: [id number]  CC:  Chief Complaint  Patient presents with  . Medical Clearance    Patient states she need papers signed for surgical clearance for a left knee replacement. Patient states forms should be faxed over.    HPI Nicole Reyes presents for preop evaluation for L tka  Has had R tka in past, knows what to expect  Feeling well today. Hx of asthma and htn - both well controled, no acute concerns. BP mildly elevated today, pt reports she is somewhat nervous  Past Medical History:  Diagnosis Date  . Allergy   . Anxiety   . Arthritis   . Asthma   . Hypertension     Past Surgical History:  Procedure Laterality Date  . COLONOSCOPY  06/29/2011   Normal  . COLONOSCOPY&ENDOSCOPY  1980's   PT UNSURE PLACE&MD WHO DID EXAMS=NORMAL  . DILATION AND CURETTAGE OF UTERUS    . LAPAROSCOPY     REMOVED SCARE TISSUE FROM OVARIES  . 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 Alcohol abuse Father   . Heart disease Father 66       AMI age 84  . Stroke Father 26       CVA  . Hypertension Mother   . Asthma Mother   . Hypertension Sister   . Hypertension Brother   . Hyperlipidemia Brother   . Hypertension Sister   . Hypertension Sister   . Hypertension Sister   . Hypertension Brother   . Colon cancer Neg Hx     Social History   Socioeconomic History  . Marital status: Single    Spouse name: n/a  . Number of children: 0  . Years of education: Not on file  . Highest education level: Not on file  Occupational History  . Occupation: employed    Fish farm manager: [employer]  Tobacco Use  . Smoking status: Never Smoker  . Smokeless tobacco: Never Used  Vaping Use    . Vaping Use: Never used  Substance and Sexual Activity  . Alcohol use: No    Alcohol/week: 0.0 standard drinks  . Drug use: No  . Sexual activity: Yes    Birth control/protection: Post-menopausal  Other Topics Concern  . 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      Tobacco: none      Alcohol: none      Exercise:  Four days per week.   Social Determinants of Health   Financial Resource Strain:   . Difficulty of Paying Living Expenses: Not on file  Food Insecurity:   . Worried About Charity fundraiser in the Last Year: Not on file  . Ran Out of Food in the Last Year: Not on file  Transportation Needs:   . Lack of Transportation (Medical): Not on file  . Lack of Transportation (Non-Medical): Not on file  Physical Activity:   . Days of Exercise per Week: Not on file  . Minutes of Exercise per Session: Not  on file  Stress:   . Feeling of Stress : Not on file  Social Connections:   . Frequency of Communication with Friends and Family: Not on file  . Frequency of Social Gatherings with Friends and Family: Not on file  . Attends Religious Services: Not on file  . Active Member of Clubs or Organizations: Not on file  . Attends Archivist Meetings: Not on file  . Marital Status: Not on file  Intimate Partner Violence:   . Fear of Current or Ex-Partner: Not on file  . Emotionally Abused: Not on file  . Physically Abused: Not on file  . Sexually Abused: Not on file    Outpatient Medications Prior to Visit  Medication Sig Dispense Refill  . albuterol (PROAIR HFA) 108 (90 Base) MCG/ACT inhaler TAKE 2 PUFFS BY MOUTH EVERY 6 HOURS AS NEEDED FOR WHEEZE 18 g 1  . amLODipine (NORVASC) 10 MG tablet Take 1 tablet (10 mg total) by mouth daily. 90 tablet 1  . atorvastatin (LIPITOR) 10 MG tablet Take 1 tablet (10 mg total) by mouth daily. 90 tablet 1  .  budesonide-formoterol (SYMBICORT) 80-4.5 MCG/ACT inhaler Inhale 2 puffs into the lungs 2 (two) times daily. 3 Inhaler 1  . cetirizine (ZYRTEC) 10 MG tablet Take 1 tablet (10 mg total) by mouth daily. 90 tablet 1  . EPINEPHrine 0.3 mg/0.3 mL IJ SOAJ injection Inject 0.3 mLs (0.3 mg total) into the muscle as needed for anaphylaxis. 1 each 0  . hydrochlorothiazide (HYDRODIURIL) 25 MG tablet Take 1 tablet (25 mg total) by mouth daily. 90 tablet 1  . hydrOXYzine (ATARAX/VISTARIL) 10 MG tablet Take 1 tablet (10 mg total) by mouth 3 (three) times daily as needed. 30 tablet 2  . nystatin cream (MYCOSTATIN) nystatin 100,000 unit/gram topical cream  APPLY TOPICALLY 2 (TWO) TIMES DAILY.    Marland Kitchen sertraline (ZOLOFT) 25 MG tablet Take 1 tablet (25 mg total) by mouth daily. 90 tablet 1   No facility-administered medications prior to visit.    Allergies  Allergen Reactions  . Peanut-Containing Drug Products Anaphylaxis    Also walnuts and almonds   . Shellfish Allergy Hives    ROS Review of Systems  Constitutional: Negative.   HENT: Negative.   Eyes: Negative.   Respiratory: Negative.   Cardiovascular: Negative.   Gastrointestinal: Negative.   Genitourinary: Negative.   Musculoskeletal: Negative.   Skin: Negative.   Neurological: Negative.   Psychiatric/Behavioral: Negative.       Objective:    Physical Exam Vitals and nursing note reviewed.  Constitutional:      General: She is not in acute distress.    Appearance: Normal appearance. She is normal weight. She is not ill-appearing, toxic-appearing or diaphoretic.  Cardiovascular:     Rate and Rhythm: Normal rate and regular rhythm.     Heart sounds: Normal heart sounds. No murmur heard.  No friction rub. No gallop.   Pulmonary:     Effort: Pulmonary effort is normal. No respiratory distress.     Breath sounds: Normal breath sounds. No stridor. No wheezing, rhonchi or rales.  Chest:     Chest wall: No tenderness.  Skin:    General:  Skin is warm and dry.  Neurological:     General: No focal deficit present.     Mental Status: She is alert and oriented to person, place, and time. Mental status is at baseline.  Psychiatric:        Mood and Affect: Mood normal.  Behavior: Behavior normal.        Thought Content: Thought content normal.        Judgment: Judgment normal.     BP (!) 166/92   Pulse 82   Temp (!) 97.5 F (36.4 C) (Temporal)   Resp 18   Ht 5' 5.5" (1.664 m)   Wt 225 lb (102.1 kg)   SpO2 100%   BMI 36.87 kg/m  Wt Readings from Last 3 Encounters:  07/19/20 225 lb (102.1 kg)  06/03/20 225 lb (102.1 kg)  05/02/20 227 lb (103 kg)     There are no preventive care reminders to display for this patient.  There are no preventive care reminders to display for this patient.  Lab Results  Component Value Date   TSH 1.130 07/19/2020   Lab Results  Component Value Date   WBC 6.2 07/19/2020   HGB 13.6 07/19/2020   HCT 40.4 07/19/2020   MCV 81 07/19/2020   PLT 286 09/03/2018   Lab Results  Component Value Date   NA 138 07/19/2020   K 3.9 07/19/2020   CO2 25 07/19/2020   GLUCOSE 94 07/19/2020   BUN 12 07/19/2020   CREATININE 0.81 07/19/2020   BILITOT 0.6 07/19/2020   ALKPHOS 142 (H) 07/19/2020   AST 43 (H) 07/19/2020   ALT 34 (H) 07/19/2020   PROT 7.8 07/19/2020   ALBUMIN 4.7 07/19/2020   CALCIUM 9.4 07/19/2020   ANIONGAP 8 09/01/2018   Lab Results  Component Value Date   CHOL 173 04/18/2020   Lab Results  Component Value Date   HDL 61 04/18/2020   Lab Results  Component Value Date   LDLCALC 99 04/18/2020   Lab Results  Component Value Date   TRIG 65 04/18/2020   Lab Results  Component Value Date   CHOLHDL 2.8 04/18/2020   Lab Results  Component Value Date   HGBA1C 5.4 07/19/2020      Assessment & Plan:   Problem List Items Addressed This Visit      Cardiovascular and Mediastinum   Hypertensive disorder   Relevant Medications    lisinopril-hydrochlorothiazide (ZESTORETIC) 20-25 MG tablet    Other Visit Diagnoses    Preoperative examination    -  Primary   Relevant Orders   EKG 12-Lead (Completed)   Protime-INR (Completed)   APTT (Completed)   Hemoglobin A1c (Completed)   CBC With Differential (Completed)   Comprehensive metabolic panel (Completed)   TSH (Completed)   Urinalysis (Completed)      Meds ordered this encounter  Medications  . lisinopril-hydrochlorothiazide (ZESTORETIC) 20-25 MG tablet    Sig: Take 1 tablet by mouth daily.    Dispense:  90 tablet    Refill:  1    Order Specific Question:   Supervising Provider    Answer:   Carlota Raspberry, JEFFREY R [[id number]]    Follow-up: No follow-ups on file.   PLAN  Refill zestoretic   Start on new dose, return for bp check between now and procedure  Labs sent  ekg unremarkable, no acute changes noted since last study on 08/31/2018  Will fill out paperwork and fax upon return of labs  Patient encouraged to call clinic with any questions, comments, or concerns.  Maximiano Coss, NP

## 2020-09-16 ENCOUNTER — Ambulatory Visit: Payer: Self-pay | Admitting: Orthopedic Surgery

## 2020-09-16 NOTE — H&P (Signed)
Nicole Reyes is an 64 y.o. female.   Chief Complaint: L knee pain HPI: History and physical. The patient is scheduled for a left total knee replacement by Dr. Tonita Cong on 10/04/20 at [facility].  Dr. Tonita Cong and the patient mutually agreed to proceed with a total knee replacement. Risks and benefits of the procedure were discussed including stiffness, suboptimal range of motion, persistent pain, infection requiring removal of prosthesis and reinsertion, need for prophylactic antibiotics in the future, for example, dental procedures, possible need for manipulation, revision in the future and also anesthetic complications including DVT, PE, etc. We discussed the perioperative course, time in the hospital, postoperative recovery and the need for elevation to control swelling. We also discussed the predicted range of motion and the probability that squatting and kneeling would be unobtainable in the future. In addition, postoperative anticoagulation was discussed. We have obtained preoperative medical clearance as necessary. Provided illustrated handout and discussed it in detail. They will enroll in the total joint replacement educational forum at the hospital.  Past Medical History:  Diagnosis Date  . Allergy   . Anxiety   . Arthritis   . Asthma   . Hypertension     Past Surgical History:  Procedure Laterality Date  . COLONOSCOPY  06/29/2011   Normal  . COLONOSCOPY&ENDOSCOPY  1980's   PT UNSURE PLACE&MD WHO DID EXAMS=NORMAL  . DILATION AND CURETTAGE OF UTERUS    . LAPAROSCOPY     REMOVED SCARE TISSUE FROM OVARIES  . 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 Alcohol abuse Father   . Heart disease Father 30       AMI age 19  . Stroke Father 36       CVA  . Hypertension Mother   . Asthma  Mother   . Hypertension Sister   . Hypertension Brother   . Hyperlipidemia Brother   . Hypertension Sister   . Hypertension Sister   . Hypertension Sister   . Hypertension Brother   . Colon cancer Neg Hx    Social History:  reports that she has never smoked. She has never used smokeless tobacco. She reports that she does not drink alcohol and does not use drugs.  Allergies:  Allergies  Allergen Reactions  . Peanut-Containing Drug Products Anaphylaxis    Also walnuts and almonds   . Shellfish Allergy Hives    (Not in a hospital admission)   No results found for this or any previous visit (from the past 48 hour(s)). No results found.  Review of Systems  Constitutional: Negative.   HENT: Negative.   Eyes: Negative.   Respiratory: Negative.   Cardiovascular: Negative.   Gastrointestinal: Negative.   Endocrine: Negative.   Genitourinary: Negative.   Musculoskeletal: Positive for arthralgias and joint swelling.  Skin: Negative.   Neurological: Negative.     There were no vitals taken for this visit. Physical Exam Constitutional:      Appearance: Normal appearance.  HENT:     Head: Normocephalic.     Right Ear: External ear normal.     Left Ear: External ear normal.     Nose: Nose normal.     Mouth/Throat:     Pharynx: Oropharynx is clear.  Cardiovascular:     Rate and Rhythm: Normal rate and regular rhythm.  Pulses: Normal pulses.  Pulmonary:     Effort: Pulmonary effort is normal.     Breath sounds: Normal breath sounds.  Abdominal:     Palpations: Abdomen is soft.  Musculoskeletal:     Cervical back: Normal range of motion.     Comments: Patient is a 64 year old female.  Left knee she walks with an antalgic gait. Slight varus deformity.  Tender in the medial joint line. Patellofemoral pain with compression. Ranges 0-100.  Ipsilateral hip and ankle exam is unremarkable. Neurovascularly intact  Skin:    General: Skin is warm and dry.  Neurological:      Mental Status: She is alert.      Assessment/Plan Impression: Left knee end-stage osteoarthritis  Plan: Pt with end-stage left knee DJD, bone-on-bone, refractory to conservative tx, scheduled for left total knee replacement by Dr. Tonita Cong on October 22. We again discussed the procedure itself as well as risks, complications and alternatives, including but not limited to DVT, PE, infx, bleeding, failure of procedure, need for secondary procedure including manipulation, nerve injury, ongoing pain/symptoms, anesthesia risk, even stroke or death. Also discussed typical post-op protocols, activity restrictions, need for PT, flexion/extension exercises, time out of work. Discussed need for DVT ppx post-op per protocol. Discussed dental ppx and infx prevention. Also discussed limitations post-operatively such as kneeling and squatting. All questions were answered. Patient desires to proceed with surgery as scheduled.  Will hold supplements, ASA and NSAIDs accordingly. Will remain NPO after midnight the night before surgery. Will present to [facility] for pre-op testing. Anticipate hospital stay to include at least 2 midnights given medical history and to ensure proper pain control. Plan ASA 325mg  BID for DVT ppx post-op. Plan Percocet, Robaxin, Colace, Miralax. Plan home with HHPT post-op with family members at home for assistance then transition to outpatient PT after that. Will follow up 10-14 days post-op for suture removal and xrays.  Plan L total knee replacement  [name], PA-C for Dr. Tonita Cong 09/16/2020, 11:29 AM

## 2020-09-16 NOTE — H&P (View-Only) (Signed)
Nicole Reyes is an 64 y.o. female.   Chief Complaint: L knee pain HPI: History and physical. The patient is scheduled for a left total knee replacement by Dr. Tonita Cong on 10/04/20 at [facility].  Dr. Tonita Cong and the patient mutually agreed to proceed with a total knee replacement. Risks and benefits of the procedure were discussed including stiffness, suboptimal range of motion, persistent pain, infection requiring removal of prosthesis and reinsertion, need for prophylactic antibiotics in the future, for example, dental procedures, possible need for manipulation, revision in the future and also anesthetic complications including DVT, PE, etc. We discussed the perioperative course, time in the hospital, postoperative recovery and the need for elevation to control swelling. We also discussed the predicted range of motion and the probability that squatting and kneeling would be unobtainable in the future. In addition, postoperative anticoagulation was discussed. We have obtained preoperative medical clearance as necessary. Provided illustrated handout and discussed it in detail. They will enroll in the total joint replacement educational forum at the hospital.  Past Medical History:  Diagnosis Date  . Allergy   . Anxiety   . Arthritis   . Asthma   . Hypertension     Past Surgical History:  Procedure Laterality Date  . COLONOSCOPY  06/29/2011   Normal  . COLONOSCOPY&ENDOSCOPY  1980's   PT UNSURE PLACE&MD WHO DID EXAMS=NORMAL  . DILATION AND CURETTAGE OF UTERUS    . LAPAROSCOPY     REMOVED SCARE TISSUE FROM OVARIES  . 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 Alcohol abuse Father   . Heart disease Father 29       AMI age 13  . Stroke Father 69       CVA  . Hypertension Mother   . Asthma  Mother   . Hypertension Sister   . Hypertension Brother   . Hyperlipidemia Brother   . Hypertension Sister   . Hypertension Sister   . Hypertension Sister   . Hypertension Brother   . Colon cancer Neg Hx    Social History:  reports that she has never smoked. She has never used smokeless tobacco. She reports that she does not drink alcohol and does not use drugs.  Allergies:  Allergies  Allergen Reactions  . Peanut-Containing Drug Products Anaphylaxis    Also walnuts and almonds   . Shellfish Allergy Hives    (Not in a hospital admission)   No results found for this or any previous visit (from the past 48 hour(s)). No results found.  Review of Systems  Constitutional: Negative.   HENT: Negative.   Eyes: Negative.   Respiratory: Negative.   Cardiovascular: Negative.   Gastrointestinal: Negative.   Endocrine: Negative.   Genitourinary: Negative.   Musculoskeletal: Positive for arthralgias and joint swelling.  Skin: Negative.   Neurological: Negative.     There were no vitals taken for this visit. Physical Exam Constitutional:      Appearance: Normal appearance.  HENT:     Head: Normocephalic.     Right Ear: External ear normal.     Left Ear: External ear normal.     Nose: Nose normal.     Mouth/Throat:     Pharynx: Oropharynx is clear.  Cardiovascular:     Rate and Rhythm: Normal rate and regular rhythm.  Pulses: Normal pulses.  Pulmonary:     Effort: Pulmonary effort is normal.     Breath sounds: Normal breath sounds.  Abdominal:     Palpations: Abdomen is soft.  Musculoskeletal:     Cervical back: Normal range of motion.     Comments: Patient is a 64 year old female.  Left knee she walks with an antalgic gait. Slight varus deformity.  Tender in the medial joint line. Patellofemoral pain with compression. Ranges 0-100.  Ipsilateral hip and ankle exam is unremarkable. Neurovascularly intact  Skin:    General: Skin is warm and dry.  Neurological:      Mental Status: She is alert.      Assessment/Plan Impression: Left knee end-stage osteoarthritis  Plan: Pt with end-stage left knee DJD, bone-on-bone, refractory to conservative tx, scheduled for left total knee replacement by Dr. Tonita Cong on October 22. We again discussed the procedure itself as well as risks, complications and alternatives, including but not limited to DVT, PE, infx, bleeding, failure of procedure, need for secondary procedure including manipulation, nerve injury, ongoing pain/symptoms, anesthesia risk, even stroke or death. Also discussed typical post-op protocols, activity restrictions, need for PT, flexion/extension exercises, time out of work. Discussed need for DVT ppx post-op per protocol. Discussed dental ppx and infx prevention. Also discussed limitations post-operatively such as kneeling and squatting. All questions were answered. Patient desires to proceed with surgery as scheduled.  Will hold supplements, ASA and NSAIDs accordingly. Will remain NPO after midnight the night before surgery. Will present to [facility] for pre-op testing. Anticipate hospital stay to include at least 2 midnights given medical history and to ensure proper pain control. Plan ASA 325mg  BID for DVT ppx post-op. Plan Percocet, Robaxin, Colace, Miralax. Plan home with HHPT post-op with family members at home for assistance then transition to outpatient PT after that. Will follow up 10-14 days post-op for suture removal and xrays.  Plan L total knee replacement  [name], PA-C for Dr. Tonita Cong 09/16/2020, 11:29 AM

## 2020-09-20 NOTE — Progress Notes (Addendum)
COVID Vaccine Completed:  x2 Date COVID Vaccine completed:  03-01-20 & 03-22-20 COVID vaccine manufacturer: [place]   PCP - Merri Ray, MD Cardiologist -   Chest x-ray -  EKG - 07-19-20 in Epic Stress Test -  ECHO -  Cardiac Cath -  Pacemaker/ICD device last checked:  Sleep Study -  CPAP -   Fasting Blood Sugar -  Checks Blood Sugar _____ times a day  Blood Thinner Instructions: Aspirin Instructions:  ASA 81 mg Last Dose:  Anesthesia review:   Patient denies shortness of breath, fever, cough and chest pain at PAT appointment   Patient verbalized understanding of instructions that were given to them at the PAT appointment. Patient was also instructed that they will need to review over the PAT instructions again at home before surgery.

## 2020-09-20 NOTE — Patient Instructions (Addendum)
DUE TO COVID-19 ONLY ONE VISITOR IS ALLOWED TO COME WITH YOU AND STAY IN THE WAITING ROOM ONLY  DURING PRE OP AND PROCEDURE.   IF YOU WILL BE ADMITTED INTO THE HOSPITAL YOU ARE ALLOWED ONE SUPPORT PERSON DURING  VISITATION HOURS ONLY (10AM -8PM)   . The support person may change daily. . The support person must pass our screening, gel in and out, and wear a mask at all times, including in the patient's room. . Patients must also wear a mask when staff or their support person are in the room.   COVID SWAB TESTING MUST BE COMPLETED ON:  Tuesday, 10-01-20 @ 11:00 AM   [address] [place], [place] [address]  (Must self quarantine after testing. Follow instructions on  handout.)    Your procedure is scheduled on:  Friday, 10-04-20   Report to [facility] Main  Entrance    Report to admitting at 10:30 AM   Call this number if you have problems the morning of surgery [phone]   Do not eat food :After Midnight.   May have liquids until 10:00 AM day of surgery  CLEAR LIQUID DIET  Foods Allowed                                                                     Foods Excluded  Water, Black Coffee and tea, regular and decaf              liquids that you cannot  Plain Jell-O in any flavor  (No red)                                     see through such as: Fruit ices (not with fruit pulp)                                      milk, soups, orange juice              Iced Popsicles (No red)                                      All solid food                                   Apple juices Sports drinks like Gatorade (No red) Lightly seasoned clear broth or consume(fat free) Sugar, honey syrup     Complete one Ensure drink the morning of surgery at 10:00 AM  the day of surgery.    Oral Hygiene is also important to reduce your risk of infection.                                    Remember - BRUSH YOUR TEETH THE MORNING OF SURGERY WITH YOUR REGULAR TOOTHPASTE   Do NOT smoke  after Midnight   Take these medicines the  morning of surgery with A SIP OF WATER:  Amlodipine, Atorvastatin.  Okay to use inhalers,  bring inhalers with you day of surgery                               You may not have any metal on your body including hair pins, jewelry, and body piercings             Do not wear make-up, lotions, powders, perfumes/cologne, or deodorant             Do not wear nail polish.  Do not shave  48 hours prior to surgery.                Do not bring valuables to the hospital. [place].   Contacts, dentures or bridgework may not be worn into surgery.   Bring small overnight bag day of surgery.                 Please read over the following fact sheets you were given: IF YOU HAVE QUESTIONS ABOUT YOUR PRE OP INSTRUCTIONS PLEASE CALL [phone]   [facility] - Preparing for Surgery Before surgery, you can play an important role.  Because skin is not sterile, your skin needs to be as free of germs as possible.  You can reduce the number of germs on your skin by washing with CHG (chlorahexidine gluconate) soap before surgery.  CHG is an antiseptic cleaner which kills germs and bonds with the skin to continue killing germs even after washing. Please DO NOT use if you have an allergy to CHG or antibacterial soaps.  If your skin becomes reddened/irritated stop using the CHG and inform your nurse when you arrive at Short Stay. Do not shave (including legs and underarms) for at least 48 hours prior to the first CHG shower.  You may shave your face/neck.  Please follow these instructions carefully:  1.  Shower with CHG Soap the night before surgery and the  morning of surgery.  2.  If you choose to wash your hair, wash your hair first as usual with your normal  shampoo.  3.  After you shampoo, rinse your hair and body thoroughly to remove the shampoo.                             4.  Use CHG as you would any other liquid soap.  You can  apply chg directly to the skin and wash.  Gently with a scrungie or clean washcloth.  5.  Apply the CHG Soap to your body ONLY FROM THE NECK DOWN.   Do   not use on face/ open                           Wound or open sores. Avoid contact with eyes, ears mouth and   genitals (private parts).                       Wash face,  Genitals (private parts) with your normal soap.             6.  Wash thoroughly, paying special attention to the area where your    surgery  will be performed.  7.  Thoroughly rinse your body with warm  water from the neck down.  8.  DO NOT shower/wash with your normal soap after using and rinsing off the CHG Soap.                9.  Pat yourself dry with a clean towel.            10.  Wear clean pajamas.            11.  Place clean sheets on your bed the night of your first shower and do not  sleep with pets. Day of Surgery : Do not apply any lotions/deodorants the morning of surgery.  Please wear clean clothes to the hospital/surgery center.  FAILURE TO FOLLOW THESE INSTRUCTIONS MAY RESULT IN THE CANCELLATION OF YOUR SURGERY  PATIENT SIGNATURE_________________________________  NURSE SIGNATURE__________________________________  ________________________________________________________________________   Nicole Reyes  An incentive spirometer is a tool that can help keep your lungs clear and active. This tool measures how well you are filling your lungs with each breath. Taking long deep breaths may help reverse or decrease the chance of developing breathing (pulmonary) problems (especially infection) following:  A long period of time when you are unable to move or be active. BEFORE THE PROCEDURE   If the spirometer includes an indicator to show your best effort, your nurse or respiratory therapist will set it to a desired goal.  If possible, sit up straight or lean slightly forward. Try not to slouch.  Hold the incentive spirometer in an upright  position. INSTRUCTIONS FOR USE  1. Sit on the edge of your bed if possible, or sit up as far as you can in bed or on a chair. 2. Hold the incentive spirometer in an upright position. 3. Breathe out normally. 4. Place the mouthpiece in your mouth and seal your lips tightly around it. 5. Breathe in slowly and as deeply as possible, raising the piston or the ball toward the top of the column. 6. Hold your breath for 3-5 seconds or for as long as possible. Allow the piston or ball to fall to the bottom of the column. 7. Remove the mouthpiece from your mouth and breathe out normally. 8. Rest for a few seconds and repeat Steps 1 through 7 at least 10 times every 1-2 hours when you are awake. Take your time and take a few normal breaths between deep breaths. 9. The spirometer may include an indicator to show your best effort. Use the indicator as a goal to work toward during each repetition. 10. After each set of 10 deep breaths, practice coughing to be sure your lungs are clear. If you have an incision (the cut made at the time of surgery), support your incision when coughing by placing a pillow or rolled up towels firmly against it. Once you are able to get out of bed, walk around indoors and cough well. You may stop using the incentive spirometer when instructed by your caregiver.  RISKS AND COMPLICATIONS  Take your time so you do not get dizzy or light-headed.  If you are in pain, you may need to take or ask for pain medication before doing incentive spirometry. It is harder to take a deep breath if you are having pain. AFTER USE  Rest and breathe slowly and easily.  It can be helpful to keep track of a log of your progress. Your caregiver can provide you with a simple table to help with this. If you are using the spirometer at home, follow these instructions: SEEK  MEDICAL CARE IF:   You are having difficultly using the spirometer.  You have trouble using the spirometer as often as  instructed.  Your pain medication is not giving enough relief while using the spirometer.  You develop fever of 100.5 F (38.1 C) or higher. SEEK IMMEDIATE MEDICAL CARE IF:   You cough up bloody sputum that had not been present before.  You develop fever of 102 F (38.9 C) or greater.  You develop worsening pain at or near the incision site. MAKE SURE YOU:   Understand these instructions.  Will watch your condition.  Will get help right away if you are not doing well or get worse. Document Released: 04/12/2007 Document Revised: 02/22/2012 Document Reviewed: 06/13/2007 [facility] Patient Information 2014 [place], [place].   ________________________________________________________________________

## 2020-09-24 ENCOUNTER — Other Ambulatory Visit: Payer: Self-pay

## 2020-09-24 ENCOUNTER — Encounter (HOSPITAL_COMMUNITY)
Admission: RE | Admit: 2020-09-24 | Discharge: 2020-09-24 | Disposition: A | Discharge status: Home / Self Care | Payer: 59 | Source: Ambulatory Visit | Attending: Specialist | Admitting: Specialist

## 2020-09-24 ENCOUNTER — Encounter (HOSPITAL_COMMUNITY): Payer: Self-pay

## 2020-09-24 DIAGNOSIS — Z01812 Encounter for preprocedural laboratory examination: Secondary | ICD-10-CM | POA: Diagnosis present

## 2020-09-24 LAB — URINALYSIS, ROUTINE W REFLEX MICROSCOPIC
Bilirubin Urine: NEGATIVE
Glucose, UA: NEGATIVE mg/dL
Hgb urine dipstick: NEGATIVE
Ketones, ur: NEGATIVE mg/dL
Leukocytes,Ua: NEGATIVE
Nitrite: NEGATIVE
Protein, ur: NEGATIVE mg/dL
Specific Gravity, Urine: 1.018 (ref 1.005–1.030)
pH: 6 (ref 5.0–8.0)

## 2020-09-24 LAB — BASIC METABOLIC PANEL
Anion gap: 9 (ref 5–15)
BUN: 19 mg/dL (ref 8–23)
CO2: 25 mmol/L (ref 22–32)
Calcium: 9.3 mg/dL (ref 8.9–10.3)
Chloride: 105 mmol/L (ref 98–111)
Creatinine, Ser: 1 mg/dL (ref 0.44–1.00)
GFR, Estimated: 60 mL/min — ABNORMAL LOW (ref >60)
Glucose, Bld: 107 mg/dL — ABNORMAL HIGH (ref 70–99)
Potassium: 4.5 mmol/L (ref 3.5–5.1)
Sodium: 139 mmol/L (ref 135–145)

## 2020-09-24 LAB — PROTIME-INR
INR: 1 (ref 0.8–1.2)
Prothrombin Time: 12.9 seconds (ref 11.4–15.2)

## 2020-09-24 LAB — CBC
HCT: 40.7 % (ref 36.0–46.0)
Hemoglobin: 13.4 g/dL (ref 12.0–15.0)
MCH: 27.3 pg (ref 26.0–34.0)
MCHC: 32.9 g/dL (ref 30.0–36.0)
MCV: 82.9 fL (ref 80.0–100.0)
Platelets: 285 10*3/uL (ref 150–400)
RBC: 4.91 MIL/uL (ref 3.87–5.11)
RDW: 12.4 % (ref 11.5–15.5)
WBC: 5.6 10*3/uL (ref 4.0–10.5)
nRBC: 0 % (ref 0.0–0.2)

## 2020-09-24 LAB — SURGICAL PCR SCREEN
MRSA, PCR: NEGATIVE
Staphylococcus aureus: NEGATIVE

## 2020-09-24 LAB — APTT: aPTT: 28 seconds (ref 24–36)

## 2020-09-30 NOTE — Progress Notes (Signed)
Nicole Reyes made aware to arrive 9:00 AM on 10/02/20 and drink pre surgery drink at 8:30 Am. She verbalized understanding.

## 2020-10-01 ENCOUNTER — Other Ambulatory Visit (HOSPITAL_COMMUNITY)
Admission: RE | Admit: 2020-10-01 | Discharge: 2020-10-01 | Disposition: A | Discharge status: Home / Self Care | Payer: 59 | Source: Ambulatory Visit | Attending: Specialist | Admitting: Specialist

## 2020-10-01 DIAGNOSIS — Z20822 Contact with and (suspected) exposure to covid-19: Secondary | ICD-10-CM | POA: Insufficient documentation

## 2020-10-01 DIAGNOSIS — Z01812 Encounter for preprocedural laboratory examination: Secondary | ICD-10-CM | POA: Insufficient documentation

## 2020-10-01 LAB — SARS CORONAVIRUS 2 (TAT 6-24 HRS): SARS Coronavirus 2: NEGATIVE

## 2020-10-02 ENCOUNTER — Inpatient Hospital Stay (HOSPITAL_COMMUNITY)
Admission: RE | Admit: 2020-10-02 | Discharge: 2020-10-05 | DRG: 470 | Disposition: A | Discharge status: Home Health | Payer: 59 | Source: Other Acute Inpatient Hospital | Attending: Specialist | Admitting: Specialist

## 2020-10-02 ENCOUNTER — Encounter (HOSPITAL_COMMUNITY): Payer: Self-pay | Admitting: Specialist

## 2020-10-02 ENCOUNTER — Inpatient Hospital Stay (HOSPITAL_COMMUNITY): Payer: 59 | Admitting: Registered Nurse

## 2020-10-02 ENCOUNTER — Encounter (HOSPITAL_COMMUNITY)
Admission: RE | Disposition: A | Discharge status: Home / Self Care | Payer: Self-pay | Source: Other Acute Inpatient Hospital | Attending: Specialist

## 2020-10-02 ENCOUNTER — Inpatient Hospital Stay (HOSPITAL_COMMUNITY): Payer: 59

## 2020-10-02 ENCOUNTER — Other Ambulatory Visit: Payer: Self-pay

## 2020-10-02 DIAGNOSIS — Z811 Family history of alcohol abuse and dependence: Secondary | ICD-10-CM

## 2020-10-02 DIAGNOSIS — Z823 Family history of stroke: Secondary | ICD-10-CM | POA: Diagnosis not present

## 2020-10-02 DIAGNOSIS — Z8249 Family history of ischemic heart disease and other diseases of the circulatory system: Secondary | ICD-10-CM

## 2020-10-02 DIAGNOSIS — M21162 Varus deformity, not elsewhere classified, left knee: Secondary | ICD-10-CM | POA: Diagnosis present

## 2020-10-02 DIAGNOSIS — Z20822 Contact with and (suspected) exposure to covid-19: Secondary | ICD-10-CM | POA: Diagnosis present

## 2020-10-02 DIAGNOSIS — Z6837 Body mass index (BMI) 37.0-37.9, adult: Secondary | ICD-10-CM

## 2020-10-02 DIAGNOSIS — Z91013 Allergy to seafood: Secondary | ICD-10-CM

## 2020-10-02 DIAGNOSIS — Z83438 Family history of other disorder of lipoprotein metabolism and other lipidemia: Secondary | ICD-10-CM

## 2020-10-02 DIAGNOSIS — M1712 Unilateral primary osteoarthritis, left knee: Secondary | ICD-10-CM | POA: Diagnosis present

## 2020-10-02 DIAGNOSIS — Z825 Family history of asthma and other chronic lower respiratory diseases: Secondary | ICD-10-CM | POA: Diagnosis not present

## 2020-10-02 DIAGNOSIS — Z91010 Allergy to peanuts: Secondary | ICD-10-CM

## 2020-10-02 DIAGNOSIS — Z96651 Presence of right artificial knee joint: Secondary | ICD-10-CM | POA: Diagnosis present

## 2020-10-02 DIAGNOSIS — Z96659 Presence of unspecified artificial knee joint: Secondary | ICD-10-CM

## 2020-10-02 HISTORY — PX: TOTAL KNEE ARTHROPLASTY: SHX125

## 2020-10-02 SURGERY — ARTHROPLASTY, KNEE, TOTAL
Anesthesia: Spinal | Site: Knee | Laterality: Left

## 2020-10-02 MED ORDER — GLYCOPYRROLATE 0.2 MG/ML IJ SOLN
INTRAMUSCULAR | Status: DC | PRN
  Administered 2020-10-02 (×2): .1 mg via INTRAVENOUS

## 2020-10-02 MED ORDER — ALBUTEROL SULFATE HFA 108 (90 BASE) MCG/ACT IN AERS: 2.0000 | INHALATION_SPRAY | Freq: Four times a day (QID) | RESPIRATORY_TRACT | Status: DC | PRN

## 2020-10-02 MED ORDER — FENTANYL CITRATE (PF) 100 MCG/2ML IJ SOLN
INTRAMUSCULAR | Status: DC | PRN
Start: 2020-10-02 — End: 2020-10-02
  Administered 2020-10-02: 25 ug via INTRAVENOUS

## 2020-10-02 MED ORDER — ACETAMINOPHEN 500 MG PO TABS
1000.0000 mg | ORAL_TABLET | Freq: Four times a day (QID) | ORAL | Status: AC
  Administered 2020-10-02 – 2020-10-03 (×3): 1000 mg via ORAL
  Filled 2020-10-02 (×4): qty 2

## 2020-10-02 MED ORDER — PROMETHAZINE HCL 25 MG/ML IJ SOLN: 6.2500 mg | INTRAMUSCULAR | Status: DC | PRN

## 2020-10-02 MED ORDER — LIDOCAINE 2% (20 MG/ML) 5 ML SYRINGE
INTRAMUSCULAR | Status: DC | PRN
  Administered 2020-10-02: 40 mg via INTRAVENOUS

## 2020-10-02 MED ORDER — ONDANSETRON HCL 4 MG/2ML IJ SOLN
INTRAMUSCULAR | Status: AC
  Filled 2020-10-02: qty 2

## 2020-10-02 MED ORDER — PROPOFOL 1000 MG/100ML IV EMUL
INTRAVENOUS | Status: AC
  Filled 2020-10-02: qty 100

## 2020-10-02 MED ORDER — PHENYLEPHRINE HCL-NACL 10-0.9 MG/250ML-% IV SOLN
INTRAVENOUS | Status: DC | PRN
  Administered 2020-10-02: 25 ug/min via INTRAVENOUS

## 2020-10-02 MED ORDER — MENTHOL 3 MG MT LOZG: 1.0000 | LOZENGE | OROMUCOSAL | Status: DC | PRN

## 2020-10-02 MED ORDER — MIDAZOLAM HCL 2 MG/2ML IJ SOLN
1.0000 mg | Freq: Once | INTRAMUSCULAR | Status: AC
  Administered 2020-10-02: 2 mg via INTRAVENOUS
  Filled 2020-10-02: qty 2

## 2020-10-02 MED ORDER — SODIUM CHLORIDE 0.9 % IR SOLN
Status: DC | PRN
  Administered 2020-10-02: 1000 mL

## 2020-10-02 MED ORDER — CHLORHEXIDINE GLUCONATE 0.12 % MT SOLN: 15.0000 mL | Freq: Once | OROMUCOSAL | Status: AC

## 2020-10-02 MED ORDER — TRANEXAMIC ACID-NACL 1000-0.7 MG/100ML-% IV SOLN
INTRAVENOUS | Status: AC
  Filled 2020-10-02: qty 100

## 2020-10-02 MED ORDER — FENTANYL CITRATE (PF) 100 MCG/2ML IJ SOLN
50.0000 ug | Freq: Once | INTRAMUSCULAR | Status: AC
  Administered 2020-10-02: 50 ug via INTRAVENOUS
  Filled 2020-10-02: qty 2

## 2020-10-02 MED ORDER — METOCLOPRAMIDE HCL 5 MG/ML IJ SOLN
5.0000 mg | Freq: Three times a day (TID) | INTRAMUSCULAR | Status: DC | PRN
  Administered 2020-10-03 – 2020-10-04 (×3): 10 mg via INTRAVENOUS
  Filled 2020-10-02 (×3): qty 2

## 2020-10-02 MED ORDER — POLYETHYLENE GLYCOL 3350 17 G PO PACK: 17.0000 g | PACK | Freq: Every day | ORAL | Status: DC | PRN

## 2020-10-02 MED ORDER — DIPHENHYDRAMINE HCL 12.5 MG/5ML PO ELIX
12.5000 mg | ORAL_SOLUTION | ORAL | Status: DC | PRN
  Administered 2020-10-03: 25 mg via ORAL
  Filled 2020-10-02: qty 10

## 2020-10-02 MED ORDER — ONDANSETRON HCL 4 MG/2ML IJ SOLN
4.0000 mg | Freq: Four times a day (QID) | INTRAMUSCULAR | Status: DC | PRN
  Administered 2020-10-03 – 2020-10-04 (×3): 4 mg via INTRAVENOUS
  Filled 2020-10-02 (×3): qty 2

## 2020-10-02 MED ORDER — OXYCODONE HCL 5 MG PO TABS
10.0000 mg | ORAL_TABLET | ORAL | Status: DC | PRN
  Administered 2020-10-02: 10 mg via ORAL
  Administered 2020-10-03 (×2): 15 mg via ORAL
  Administered 2020-10-03: 10 mg via ORAL
  Administered 2020-10-03 – 2020-10-04 (×6): 15 mg via ORAL
  Filled 2020-10-02 (×2): qty 3
  Filled 2020-10-02: qty 2
  Filled 2020-10-02 (×5): qty 3

## 2020-10-02 MED ORDER — MOMETASONE FURO-FORMOTEROL FUM 100-5 MCG/ACT IN AERO
2.0000 | INHALATION_SPRAY | Freq: Two times a day (BID) | RESPIRATORY_TRACT | Status: DC
  Administered 2020-10-02 – 2020-10-05 (×6): 2 via RESPIRATORY_TRACT
  Filled 2020-10-02: qty 8.8

## 2020-10-02 MED ORDER — RISAQUAD PO CAPS
1.0000 | ORAL_CAPSULE | Freq: Every day | ORAL | Status: DC
  Administered 2020-10-03 – 2020-10-05 (×3): 1 via ORAL
  Filled 2020-10-02 (×3): qty 1

## 2020-10-02 MED ORDER — MEPERIDINE HCL 50 MG/ML IJ SOLN: 6.2500 mg | INTRAMUSCULAR | Status: DC | PRN

## 2020-10-02 MED ORDER — SERTRALINE HCL 25 MG PO TABS
25.0000 mg | ORAL_TABLET | Freq: Every day | ORAL | Status: DC
  Administered 2020-10-03 – 2020-10-05 (×3): 25 mg via ORAL
  Filled 2020-10-02 (×3): qty 1

## 2020-10-02 MED ORDER — CEFAZOLIN SODIUM-DEXTROSE 2-4 GM/100ML-% IV SOLN
2.0000 g | INTRAVENOUS | Status: AC
  Administered 2020-10-02: 2 g via INTRAVENOUS
  Filled 2020-10-02: qty 100

## 2020-10-02 MED ORDER — ACETAMINOPHEN 10 MG/ML IV SOLN
1000.0000 mg | INTRAVENOUS | Status: AC
  Administered 2020-10-02: 1000 mg via INTRAVENOUS

## 2020-10-02 MED ORDER — LISINOPRIL 20 MG PO TABS
20.0000 mg | ORAL_TABLET | Freq: Every day | ORAL | Status: DC
  Administered 2020-10-03 – 2020-10-05 (×3): 20 mg via ORAL
  Filled 2020-10-02 (×3): qty 1

## 2020-10-02 MED ORDER — ORAL CARE MOUTH RINSE
15.0000 mL | Freq: Once | OROMUCOSAL | Status: AC
  Administered 2020-10-02: 15 mL via OROMUCOSAL

## 2020-10-02 MED ORDER — CEFAZOLIN SODIUM-DEXTROSE 2-4 GM/100ML-% IV SOLN
2.0000 g | Freq: Four times a day (QID) | INTRAVENOUS | Status: AC
  Administered 2020-10-02 – 2020-10-03 (×3): 2 g via INTRAVENOUS
  Filled 2020-10-02 (×3): qty 100

## 2020-10-02 MED ORDER — CLONIDINE HCL (ANALGESIA) 100 MCG/ML EP SOLN
EPIDURAL | Status: DC | PRN
  Administered 2020-10-02: 100 ug

## 2020-10-02 MED ORDER — OXYCODONE HCL 5 MG PO TABS
5.0000 mg | ORAL_TABLET | ORAL | Status: DC | PRN
  Administered 2020-10-05 (×2): 10 mg via ORAL
  Filled 2020-10-02 (×4): qty 2
  Filled 2020-10-02: qty 1

## 2020-10-02 MED ORDER — ONDANSETRON HCL 4 MG/2ML IJ SOLN
INTRAMUSCULAR | Status: DC | PRN
  Administered 2020-10-02: 4 mg via INTRAVENOUS

## 2020-10-02 MED ORDER — PROPOFOL 10 MG/ML IV BOLUS
INTRAVENOUS | Status: DC | PRN
  Administered 2020-10-02: 20 mg via INTRAVENOUS

## 2020-10-02 MED ORDER — DOCUSATE SODIUM 100 MG PO CAPS
100.0000 mg | ORAL_CAPSULE | Freq: Two times a day (BID) | ORAL | Status: DC
  Administered 2020-10-02 – 2020-10-05 (×6): 100 mg via ORAL
  Filled 2020-10-02 (×6): qty 1

## 2020-10-02 MED ORDER — LACTATED RINGERS IV SOLN: INTRAVENOUS | Status: DC

## 2020-10-02 MED ORDER — TRANEXAMIC ACID-NACL 1000-0.7 MG/100ML-% IV SOLN
1000.0000 mg | INTRAVENOUS | Status: AC
  Administered 2020-10-02: 1000 mg via INTRAVENOUS

## 2020-10-02 MED ORDER — METOCLOPRAMIDE HCL 5 MG PO TABS: 5.0000 mg | ORAL_TABLET | Freq: Three times a day (TID) | ORAL | Status: DC | PRN

## 2020-10-02 MED ORDER — BUPIVACAINE-EPINEPHRINE 0.5% -1:200000 IJ SOLN
INTRAMUSCULAR | Status: AC
  Filled 2020-10-02: qty 1

## 2020-10-02 MED ORDER — LISINOPRIL-HYDROCHLOROTHIAZIDE 20-25 MG PO TABS: 1.0000 | ORAL_TABLET | Freq: Every day | ORAL | Status: DC

## 2020-10-02 MED ORDER — BISACODYL 5 MG PO TBEC: 5.0000 mg | DELAYED_RELEASE_TABLET | Freq: Every day | ORAL | Status: DC | PRN

## 2020-10-02 MED ORDER — ACETAMINOPHEN 10 MG/ML IV SOLN
INTRAVENOUS | Status: AC
  Filled 2020-10-02: qty 100

## 2020-10-02 MED ORDER — BUPIVACAINE HCL (PF) 0.25 % IJ SOLN
INTRAMUSCULAR | Status: AC
  Filled 2020-10-02: qty 30

## 2020-10-02 MED ORDER — MAGNESIUM CITRATE PO SOLN: 1.0000 | Freq: Once | ORAL | Status: DC | PRN

## 2020-10-02 MED ORDER — METHOCARBAMOL 500 MG IVPB - SIMPLE MED
500.0000 mg | Freq: Four times a day (QID) | INTRAVENOUS | Status: DC | PRN
  Administered 2020-10-04: 500 mg via INTRAVENOUS
  Filled 2020-10-02: qty 50

## 2020-10-02 MED ORDER — PHENYLEPHRINE 40 MCG/ML (10ML) SYRINGE FOR IV PUSH (FOR BLOOD PRESSURE SUPPORT)
PREFILLED_SYRINGE | INTRAVENOUS | Status: DC | PRN
  Administered 2020-10-02: 80 ug via INTRAVENOUS
  Administered 2020-10-02 (×2): 120 ug via INTRAVENOUS
  Administered 2020-10-02: 80 ug via INTRAVENOUS

## 2020-10-02 MED ORDER — BUPIVACAINE HCL (PF) 0.5 % IJ SOLN
INTRAMUSCULAR | Status: DC | PRN
  Administered 2020-10-02: 2.5 mL via INTRATHECAL

## 2020-10-02 MED ORDER — ONDANSETRON HCL 4 MG PO TABS
4.0000 mg | ORAL_TABLET | Freq: Four times a day (QID) | ORAL | Status: DC | PRN
  Administered 2020-10-03: 4 mg via ORAL
  Filled 2020-10-02: qty 1

## 2020-10-02 MED ORDER — DEXAMETHASONE SODIUM PHOSPHATE 10 MG/ML IJ SOLN
INTRAMUSCULAR | Status: DC | PRN
  Administered 2020-10-02: 4 mg via INTRAVENOUS

## 2020-10-02 MED ORDER — HYDROMORPHONE HCL 1 MG/ML IJ SOLN: 0.2500 mg | INTRAMUSCULAR | Status: DC | PRN

## 2020-10-02 MED ORDER — AMLODIPINE BESYLATE 10 MG PO TABS
10.0000 mg | ORAL_TABLET | Freq: Every day | ORAL | Status: DC
  Administered 2020-10-03 – 2020-10-05 (×3): 10 mg via ORAL
  Filled 2020-10-02 (×3): qty 1

## 2020-10-02 MED ORDER — PHENOL 1.4 % MT LIQD: 1.0000 | OROMUCOSAL | Status: DC | PRN

## 2020-10-02 MED ORDER — METHOCARBAMOL 500 MG PO TABS
500.0000 mg | ORAL_TABLET | Freq: Four times a day (QID) | ORAL | Status: DC | PRN
  Administered 2020-10-02 – 2020-10-04 (×5): 500 mg via ORAL
  Filled 2020-10-02 (×6): qty 1

## 2020-10-02 MED ORDER — KCL IN DEXTROSE-NACL 20-5-0.45 MEQ/L-%-% IV SOLN
INTRAVENOUS | Status: AC
  Filled 2020-10-02 (×2): qty 1000

## 2020-10-02 MED ORDER — LIDOCAINE 2% (20 MG/ML) 5 ML SYRINGE
INTRAMUSCULAR | Status: AC
  Filled 2020-10-02: qty 5

## 2020-10-02 MED ORDER — HYDROMORPHONE HCL 1 MG/ML IJ SOLN
0.5000 mg | INTRAMUSCULAR | Status: DC | PRN
  Administered 2020-10-02 – 2020-10-04 (×5): 1 mg via INTRAVENOUS
  Filled 2020-10-02 (×5): qty 1

## 2020-10-02 MED ORDER — DEXAMETHASONE SODIUM PHOSPHATE 4 MG/ML IJ SOLN
INTRAMUSCULAR | Status: DC | PRN
  Administered 2020-10-02: 8 mg via PERINEURAL

## 2020-10-02 MED ORDER — STERILE WATER FOR IRRIGATION IR SOLN
Status: DC | PRN
  Administered 2020-10-02: 2000 mL

## 2020-10-02 MED ORDER — FLUTICASONE PROPIONATE 50 MCG/ACT NA SUSP
2.0000 | Freq: Every day | NASAL | Status: DC | PRN
  Filled 2020-10-02: qty 16

## 2020-10-02 MED ORDER — HYDROCHLOROTHIAZIDE 25 MG PO TABS
25.0000 mg | ORAL_TABLET | Freq: Every day | ORAL | Status: DC
  Filled 2020-10-02 (×3): qty 1

## 2020-10-02 MED ORDER — PROPOFOL 500 MG/50ML IV EMUL
INTRAVENOUS | Status: DC | PRN
  Administered 2020-10-02: 75 ug/kg/min via INTRAVENOUS

## 2020-10-02 MED ORDER — FENTANYL CITRATE (PF) 100 MCG/2ML IJ SOLN
INTRAMUSCULAR | Status: AC
Start: 2020-10-02 — End: ?
  Filled 2020-10-02: qty 2

## 2020-10-02 MED ORDER — BUPIVACAINE-EPINEPHRINE (PF) 0.5% -1:200000 IJ SOLN
INTRAMUSCULAR | Status: DC | PRN
  Administered 2020-10-02: 50 mL

## 2020-10-02 MED ORDER — DEXAMETHASONE SODIUM PHOSPHATE 10 MG/ML IJ SOLN
INTRAMUSCULAR | Status: AC
  Filled 2020-10-02: qty 1

## 2020-10-02 MED ORDER — ASPIRIN 81 MG PO CHEW
81.0000 mg | CHEWABLE_TABLET | Freq: Two times a day (BID) | ORAL | Status: DC
  Administered 2020-10-03 – 2020-10-05 (×5): 81 mg via ORAL
  Filled 2020-10-02 (×5): qty 1

## 2020-10-02 MED ORDER — BUPIVACAINE HCL (PF) 0.5 % IJ SOLN
INTRAMUSCULAR | Status: DC | PRN
  Administered 2020-10-02: 30 mL via PERINEURAL

## 2020-10-02 MED ORDER — ALUM & MAG HYDROXIDE-SIMETH 200-200-20 MG/5ML PO SUSP: 30.0000 mL | ORAL | Status: DC | PRN

## 2020-10-02 SURGICAL SUPPLY — 80 items
ATTUNE MED DOME PAT 38 KNEE (Knees) ×2 IMPLANT
ATTUNE MED DOME PAT 38MM KNEE (Knees) ×1 IMPLANT
ATTUNE PS FEM LT SZ 5 CEM KNEE (Femur) ×3 IMPLANT
ATTUNE PSRP INSE SZ 5 7MM KNEE (Insert) ×1 IMPLANT
ATTUNE PSRP INSE SZ5 7 KNEE (Insert) ×2 IMPLANT
BAG DECANTER FOR FLEXI CONT (MISCELLANEOUS) IMPLANT
BAG ZIPLOCK 12X15 (MISCELLANEOUS) IMPLANT
BASE TIBIAL ROT PLAT SZ 5 KNEE (Knees) ×1 IMPLANT
BLADE SAG 18X100X1.27 (BLADE) ×3 IMPLANT
BLADE SAW SGTL 11.0X1.19X90.0M (BLADE) ×3 IMPLANT
BLADE SAW SGTL 13.0X1.19X90.0M (BLADE) ×3 IMPLANT
BLADE SURG SZ10 CARB STEEL (BLADE) ×6 IMPLANT
BNDG COHESIVE 4X5 TAN STRL (GAUZE/BANDAGES/DRESSINGS) ×3 IMPLANT
BNDG ELASTIC 4X5.8 VLCR STR LF (GAUZE/BANDAGES/DRESSINGS) ×3 IMPLANT
BNDG ELASTIC 6X5.8 VLCR STR LF (GAUZE/BANDAGES/DRESSINGS) ×3 IMPLANT
CEMENT HV SMART SET (Cement) ×6 IMPLANT
CLOSURE WOUND 1/2 X4 (GAUZE/BANDAGES/DRESSINGS) ×2
COVER SURGICAL LIGHT HANDLE (MISCELLANEOUS) ×3 IMPLANT
COVER WAND RF STERILE (DRAPES) IMPLANT
CUFF TOURN SGL QUICK 34 (TOURNIQUET CUFF) ×3
CUFF TRNQT CYL 34X4.125X (TOURNIQUET CUFF) ×1 IMPLANT
DECANTER SPIKE VIAL GLASS SM (MISCELLANEOUS) ×3 IMPLANT
DRAPE INCISE IOBAN 66X45 STRL (DRAPES) ×3 IMPLANT
DRAPE ORTHO SPLIT 77X108 STRL (DRAPES) ×6
DRAPE SHEET LG 3/4 BI-LAMINATE (DRAPES) ×6 IMPLANT
DRAPE SURG ORHT 6 SPLT 77X108 (DRAPES) ×2 IMPLANT
DRAPE U-SHAPE 47X51 STRL (DRAPES) ×3 IMPLANT
DRSG AQUACEL AG ADV 3.5X10 (GAUZE/BANDAGES/DRESSINGS) ×3 IMPLANT
DRSG TEGADERM 4X4.75 (GAUZE/BANDAGES/DRESSINGS) IMPLANT
DURAPREP 26ML APPLICATOR (WOUND CARE) ×3 IMPLANT
ELECT BLADE TIP CTD 4 INCH (ELECTRODE) ×3 IMPLANT
ELECT REM PT RETURN 15FT ADLT (MISCELLANEOUS) ×3 IMPLANT
EVACUATOR 1/8 PVC DRAIN (DRAIN) IMPLANT
GAUZE SPONGE 2X2 8PLY STRL LF (GAUZE/BANDAGES/DRESSINGS) IMPLANT
GLOVE BIOGEL PI IND STRL 7.5 (GLOVE) ×1 IMPLANT
GLOVE BIOGEL PI IND STRL 8 (GLOVE) ×1 IMPLANT
GLOVE BIOGEL PI INDICATOR 7.5 (GLOVE) ×2
GLOVE BIOGEL PI INDICATOR 8 (GLOVE) ×2
GLOVE SURG SS PI 7.5 STRL IVOR (GLOVE) ×6 IMPLANT
GLOVE SURG SS PI 8.0 STRL IVOR (GLOVE) ×6 IMPLANT
GOWN STRL REUS W/TWL XL LVL3 (GOWN DISPOSABLE) ×6 IMPLANT
HANDPIECE INTERPULSE COAX TIP (DISPOSABLE) ×3
HEMOSTAT SPONGE AVITENE ULTRA (HEMOSTASIS) ×3 IMPLANT
HOLDER FOLEY CATH W/STRAP (MISCELLANEOUS) ×3 IMPLANT
IMMOBILIZER KNEE 20 (SOFTGOODS) ×3
IMMOBILIZER KNEE 20 THIGH 36 (SOFTGOODS) ×1 IMPLANT
KIT TURNOVER KIT A (KITS) IMPLANT
MANIFOLD NEPTUNE II (INSTRUMENTS) ×3 IMPLANT
NDL SAFETY ECLIPSE 18X1.5 (NEEDLE) IMPLANT
NEEDLE HYPO 18GX1.5 SHARP (NEEDLE)
NS IRRIG 1000ML POUR BTL (IV SOLUTION) ×3 IMPLANT
PACK TOTAL KNEE CUSTOM (KITS) ×3 IMPLANT
PIN DRILL FIX HALF THREAD (BIT) ×3 IMPLANT
PIN STEINMAN FIXATION KNEE (PIN) ×3 IMPLANT
PROTECTOR NERVE ULNAR (MISCELLANEOUS) ×3 IMPLANT
SEALER BIPOLAR AQUA 6.0 (INSTRUMENTS) ×3 IMPLANT
SET HNDPC FAN SPRY TIP SCT (DISPOSABLE) ×1 IMPLANT
SPONGE GAUZE 2X2 STER 10/PKG (GAUZE/BANDAGES/DRESSINGS)
SPONGE SURGIFOAM ABS GEL 100 (HEMOSTASIS) IMPLANT
STAPLER VISISTAT (STAPLE) IMPLANT
STRIP CLOSURE SKIN 1/2X4 (GAUZE/BANDAGES/DRESSINGS) ×4 IMPLANT
SUT BONE WAX W31G (SUTURE) IMPLANT
SUT MNCRL AB 4-0 PS2 18 (SUTURE) IMPLANT
SUT STRATAFIX 0 PDS 27 VIOLET (SUTURE) ×3
SUT VIC AB 1 CT1 27 (SUTURE) ×6
SUT VIC AB 1 CT1 27XBRD ANTBC (SUTURE) ×2 IMPLANT
SUT VIC AB 1 CTX 36 (SUTURE)
SUT VIC AB 1 CTX36XBRD ANBCTR (SUTURE) IMPLANT
SUT VIC AB 2-0 CT1 27 (SUTURE) ×9
SUT VIC AB 2-0 CT1 TAPERPNT 27 (SUTURE) ×3 IMPLANT
SUTURE STRATFX 0 PDS 27 VIOLET (SUTURE) ×1 IMPLANT
SYR 3ML LL SCALE MARK (SYRINGE) IMPLANT
SYR 50ML LL SCALE MARK (SYRINGE) IMPLANT
TIBIAL BASE ROT PLAT SZ 5 KNEE (Knees) ×3 IMPLANT
TOWER CARTRIDGE SMART MIX (DISPOSABLE) ×3 IMPLANT
TRAY FOLEY MTR SLVR 16FR STAT (SET/KITS/TRAYS/PACK) ×3 IMPLANT
TUBE SUCTION HIGH CAP CLEAR NV (SUCTIONS) ×3 IMPLANT
WATER STERILE IRR 1000ML POUR (IV SOLUTION) ×6 IMPLANT
WIPE CHG CHLORHEXIDINE 2% (PERSONAL CARE ITEMS) ×3 IMPLANT
WRAP KNEE MAXI GEL POST OP (GAUZE/BANDAGES/DRESSINGS) ×3 IMPLANT

## 2020-10-02 NOTE — Anesthesia Preprocedure Evaluation (Addendum)
Anesthesia Evaluation  Patient identified by MRN, date of birth, ID band Patient awake    Reviewed: Allergy & Precautions, NPO status , Patient's Chart, lab work & pertinent test results  History of Anesthesia Complications Negative for: history of anesthetic complications  Airway Mallampati: II  TM Distance: >3 FB Neck ROM: Full    Dental  (+) Chipped, Dental Advisory Given   Pulmonary asthma , COPD,  COPD inhaler,    breath sounds clear to auscultation       Cardiovascular hypertension, Pt. on medications (-) angina Rhythm:Regular Rate:Normal  '11 ECHO: EF 55-60%, valves OK   Neuro/Psych PSYCHIATRIC DISORDERS Anxiety    GI/Hepatic negative GI ROS, Neg liver ROS,   Endo/Other  Morbid obesity  Renal/GU negative Renal ROS     Musculoskeletal  (+) Arthritis , Osteoarthritis,    Abdominal (+) + obese,   Peds  Hematology negative hematology ROS (+)   Anesthesia Other Findings   Reproductive/Obstetrics                            Anesthesia Physical  Anesthesia Plan  ASA: II  Anesthesia Plan: Spinal   Post-op Pain Management:  Regional for Post-op pain   Induction:   PONV Risk Score and Plan: 3 and Ondansetron, Propofol infusion, Treatment may vary due to age or medical condition and TIVA  Airway Management Planned: Natural Airway and Nasal Cannula  Additional Equipment: None  Intra-op Plan:   Post-operative Plan:   Informed Consent: I have reviewed the patients History and Physical, chart, labs and discussed the procedure including the risks, benefits and alternatives for the proposed anesthesia with the patient or authorized representative who has indicated his/her understanding and acceptance.     Dental advisory given  Plan Discussed with: CRNA  Anesthesia Plan Comments:         Anesthesia Quick Evaluation

## 2020-10-02 NOTE — Transfer of Care (Signed)
Immediate Anesthesia Transfer of Care Note  Patient: Nicole Reyes  Procedure(s) Performed: TOTAL KNEE ARTHROPLASTY (Left Knee)  Patient Location: PACU  Anesthesia Type:Spinal and MAC combined with regional for post-op pain  Level of Consciousness: awake, oriented, patient cooperative and responds to stimulation  Airway & Oxygen Therapy: Patient Spontanous Breathing and Patient connected to face mask oxygen  Post-op Assessment: Report given to RN and Post -op Vital signs reviewed and stable  Post vital signs: Reviewed and stable  Last Vitals:  Vitals Value Taken Time  BP 114/66 10/02/20 1517  Temp    Pulse 91 10/02/20 1519  Resp 25 10/02/20 1519  SpO2 98 % 10/02/20 1519  Vitals shown include unvalidated device data.  Last Pain:  Vitals:   10/02/20 1100  TempSrc:   PainSc: 0-No pain         Complications: No complications documented.

## 2020-10-02 NOTE — Anesthesia Procedure Notes (Signed)
Spinal  Patient location during procedure: OR Start time: 10/02/2020 12:20 PM End time: 10/02/2020 12:23 PM Staffing Performed: anesthesiologist  Anesthesiologist: Nolon Nations, MD Preanesthetic Checklist Completed: patient identified, IV checked, site marked, risks and benefits discussed, surgical consent, monitors and equipment checked, pre-op evaluation and timeout performed Spinal Block Patient position: sitting Prep: DuraPrep Patient monitoring: heart rate, cardiac monitor, continuous pulse ox and blood pressure Approach: right paramedian Location: L3-4 Injection technique: single-shot Needle Needle type: Sprotte  Needle gauge: 24 G Needle length: 9 cm Assessment Sensory level: T4

## 2020-10-02 NOTE — Brief Op Note (Signed)
10/02/2020  2:58 PM  PATIENT:  Nicole Reyes  64 y.o. female  PRE-OPERATIVE DIAGNOSIS:  Degenerative joint disease left knee  POST-OPERATIVE DIAGNOSIS:  Degenerative joint disease left knee  PROCEDURE:  Procedure(s) with comments: TOTAL KNEE ARTHROPLASTY (Left) - 2.5 hrs  SURGEON:  Surgeon(s) and Role:    Susa Day, MD - Primary  PHYSICIAN ASSISTANT:   ASSISTANTS: Bissell   ANESTHESIA:   spinal  EBL:  570 mL   BLOOD ADMINISTERED:none  DRAINS: none   LOCAL MEDICATIONS USED:  MARCAINE     SPECIMEN:  No Specimen  DISPOSITION OF SPECIMEN:  N/A  COUNTS:  YES  TOURNIQUET:   Total Tourniquet Time Documented: Thigh (Left) - 92 minutes Total: Thigh (Left) - 92 minutes   DICTATION: .Other Dictation: Dictation Number  [id number]  PLAN OF CARE: Admit to inpatient   PATIENT DISPOSITION:  PACU - hemodynamically stable.   Delay start of Pharmacological VTE agent (>24hrs) due to surgical blood loss or risk of bleeding: no

## 2020-10-02 NOTE — Discharge Instructions (Signed)
Elevate leg above heart 6x a day for 20minutes each °Use knee immobilizer while walking until can SLR x 10 °Use knee immobilizer in bed to keep knee in extension °Aquacel dressing may remain in place until follow up. May shower with aquacel dressing in place. If the dressing becomes saturated or peels off, you may remove aquacel dressing. Do not remove steri-strips if they are present. Place new dressing with gauze and tape or ACE bandage which should be kept clean and dry and changed daily. ° °INSTRUCTIONS AFTER JOINT REPLACEMENT  ° °o Remove items at home which could result in a fall. This includes throw rugs or furniture in walking pathways °o ICE to the affected joint every three hours while awake for 30 minutes at a time, for at least the first 3-5 days, and then as needed for pain and swelling.  Continue to use ice for pain and swelling. You may notice swelling that will progress down to the foot and ankle.  This is normal after surgery.  Elevate your leg when you are not up walking on it.   °o Continue to use the breathing machine you got in the hospital (incentive spirometer) which will help keep your temperature down.  It is common for your temperature to cycle up and down following surgery, especially at night when you are not up moving around and exerting yourself.  The breathing machine keeps your lungs expanded and your temperature down. ° ° °DIET:  As you were doing prior to hospitalization, we recommend a well-balanced diet. ° °DRESSING / WOUND CARE / SHOWERING ° °Keep the surgical dressing until follow up.  The dressing is water proof, so you can shower without any extra covering.  IF THE DRESSING FALLS OFF or the wound gets wet inside, change the dressing with sterile gauze.  Please use good hand washing techniques before changing the dressing.  Do not use any lotions or creams on the incision until instructed by your surgeon.   ° °ACTIVITY ° °o Increase activity slowly as tolerated, but follow the  weight bearing instructions below.   °o No driving for 6 weeks or until further direction given by your physician.  You cannot drive while taking narcotics.  °o No lifting or carrying greater than 10 lbs. until further directed by your surgeon. °o Avoid periods of inactivity such as sitting longer than an hour when not asleep. This helps prevent blood clots.  °o You may return to work once you are authorized by your doctor.  ° ° ° °WEIGHT BEARING  ° °Weight bearing as tolerated with assist device (walker, cane, etc) as directed, use it as long as suggested by your surgeon or therapist, typically at least 4-6 weeks. ° ° °EXERCISES ° °Results after joint replacement surgery are often greatly improved when you follow the exercise, range of motion and muscle strengthening exercises prescribed by your doctor. Safety measures are also important to protect the joint from further injury. Any time any of these exercises cause you to have increased pain or swelling, decrease what you are doing until you are comfortable again and then slowly increase them. If you have problems or questions, call your caregiver or physical therapist for advice.  ° °Rehabilitation is important following a joint replacement. After just a few days of immobilization, the muscles of the leg can become weakened and shrink (atrophy).  These exercises are designed to build up the tone and strength of the thigh and leg muscles and to improve motion. Often   times heat used for twenty to thirty minutes before working out will loosen up your tissues and help with improving the range of motion but do not use heat for the first two weeks following surgery (sometimes heat can increase post-operative swelling).  ° °These exercises can be done on a training (exercise) mat, on the floor, on a table or on a bed. Use whatever works the best and is most comfortable for you.    Use music or television while you are exercising so that the exercises are a pleasant  break in your day. This will make your life better with the exercises acting as a break in your routine that you can look forward to.   Perform all exercises about fifteen times, three times per day or as directed.  You should exercise both the operative leg and the other leg as well. ° °Exercises include: °  °• Quad Sets - Tighten up the muscle on the front of the thigh (Quad) and hold for 5-10 seconds.   °• Straight Leg Raises - With your knee straight (if you were given a brace, keep it on), lift the leg to 60 degrees, hold for 3 seconds, and slowly lower the leg.  Perform this exercise against resistance later as your leg gets stronger.  °• Leg Slides: Lying on your back, slowly slide your foot toward your buttocks, bending your knee up off the floor (only go as far as is comfortable). Then slowly slide your foot back down until your leg is flat on the floor again.  °• Angel Wings: Lying on your back spread your legs to the side as far apart as you can without causing discomfort.  °• Hamstring Strength:  Lying on your back, push your heel against the floor with your leg straight by tightening up the muscles of your buttocks.  Repeat, but this time bend your knee to a comfortable angle, and push your heel against the floor.  You may put a pillow under the heel to make it more comfortable if necessary.  ° °A rehabilitation program following joint replacement surgery can speed recovery and prevent re-injury in the future due to weakened muscles. Contact your doctor or a physical therapist for more information on knee rehabilitation.  ° ° °CONSTIPATION ° °Constipation is defined medically as fewer than three stools per week and severe constipation as less than one stool per week.  Even if you have a regular bowel pattern at home, your normal regimen is likely to be disrupted due to multiple reasons following surgery.  Combination of anesthesia, postoperative narcotics, change in appetite and fluid intake all can  affect your bowels.  ° °YOU MUST use at least one of the following options; they are listed in order of increasing strength to get the job done.  They are all available over the counter, and you may need to use some, POSSIBLY even all of these options:   ° °Drink plenty of fluids (prune juice may be helpful) and high fiber foods °Colace 100 mg by mouth twice a day  °Senokot for constipation as directed and as needed Dulcolax (bisacodyl), take with full glass of water  °Miralax (polyethylene glycol) once or twice a day as needed. ° °If you have tried all these things and are unable to have a bowel movement in the first 3-4 days after surgery call either your surgeon or your primary doctor.   ° °If you experience loose stools or diarrhea, hold the medications until you stool   forms back up.  If your symptoms do not get better within 1 week or if they get worse, check with your doctor.  If you experience "the worst abdominal pain ever" or develop nausea or vomiting, please contact the office immediately for further recommendations for treatment.   ITCHING:  If you experience itching with your medications, try taking only a single pain pill, or even half a pain pill at a time.  You can also use Benadryl over the counter for itching or also to help with sleep.   TED HOSE STOCKINGS:  Use stockings on both legs until for at least 2 weeks or as directed by physician office. They may be removed at night for sleeping.  MEDICATIONS:  See your medication summary on the "After Visit Summary" that nursing will review with you.  You may have some home medications which will be placed on hold until you complete the course of blood thinner medication.  It is important for you to complete the blood thinner medication as prescribed.  PRECAUTIONS:  If you experience chest pain or shortness of breath - call 911 immediately for transfer to the hospital emergency department.   If you develop a fever greater that 101 F, purulent  drainage from wound, increased redness or drainage from wound, foul odor from the wound/dressing, or calf pain - CONTACT YOUR SURGEON.                                                   FOLLOW-UP APPOINTMENTS:  If you do not already have a post-op appointment, please call the office for an appointment to be seen by your surgeon.  Guidelines for how soon to be seen are listed in your "After Visit Summary", but are typically between 1-4 weeks after surgery.  OTHER INSTRUCTIONS:   Knee Replacement:  Do not place pillow under knee, focus on keeping the knee straight while resting. CPM instructions: 0-90 degrees, 2 hours in the morning, 2 hours in the afternoon, and 2 hours in the evening. Place foam block, curve side up under heel at all times except when in CPM or when walking.  DO NOT modify, tear, cut, or change the foam block in any way.   DENTAL ANTIBIOTICS:  In most cases prophylactic antibiotics for Dental procdeures after total joint surgery are not necessary.  Exceptions are as follows:  1. History of prior total joint infection  2. Severely immunocompromised (Organ Transplant, cancer chemotherapy, Rheumatoid biologic meds such as Humera)  3. Poorly controlled diabetes (A1C &gt; 8.0, blood glucose over 200)  If you have one of these conditions, contact your surgeon for an antibiotic prescription, prior to your dental procedure.   MAKE SURE YOU:  . Understand these instructions.  . Get help right away if you are not doing well or get worse.    Thank you for letting us be a part of your medical care team.  It is a privilege we respect greatly.  We hope these instructions will help you stay on track for a fast and full recovery!   

## 2020-10-02 NOTE — Interval H&P Note (Signed)
History and Physical Interval Note:  10/02/2020 11:40 AM  Nicole Reyes  has presented today for surgery, with the diagnosis of Degenerative joint disease left knee.  The various methods of treatment have been discussed with the patient and family. After consideration of risks, benefits and other options for treatment, the patient has consented to  Procedure(s) with comments: TOTAL KNEE ARTHROPLASTY (Left) - 2.5 hrs as a surgical intervention.  The patient's history has been reviewed, patient examined, no change in status, stable for surgery.  I have reviewed the patient's chart and labs.  Questions were answered to the patient's satisfaction.     Nicole Reyes

## 2020-10-02 NOTE — Op Note (Signed)
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  PROCEDURE PERFORMED:  Left total knee arthroplasty utilizing DePuy Attune rotating platform components, 5 femur, 5 tibial tray, a 7 mm insert, 58mm patella.  ANESTHESIA:  Spinal.  SURGEON:  Susa Day, MD  ASSISTANT:  Lacie Draft, PA  HISTORY:  This is a 64 year old female with end-stage osteoarthritis of the medial compartment of the knee.  She was indicated for replacement of the degenerated joint.  Risks and benefits discussed, including bleeding, infection, damage to neurovascular  structures, suboptimal range of motion, arthrofibrosis, component loosening, DVT, PE, anesthetic complications, etc.  DESCRIPTION OF PROCEDURE:   With the patient in supine position, after induction of adequate general anesthesia, left lower extremity was prepped and draped in the usual sterile fashion, exsanguinated with thigh tourniquet inflated to 250 mmHg.  Midline  incision was then made over the patella.  Full thickness flaps developed.  Patellar arthrotomy was then performed.  Patella everted.  The knee was flexed.  Severe tricompartmental osteoarthrosis was noted, particularly with eburnated bone medially and  worn posteromedially.  Multiple osteophytes were removed with a rongeur.  The fat pad was debrided.  Retractors were placed.  We fashioned a starting hole above the femoral notch for the femoral drill.  We drilled the femoral canal, irrigated.  A  T-handle reamer placed.  This was within the canal.  I put a 5-degree right with a 10 off the distal femur distal femoral cutting jig.  This was  pinned and we performed a distal femoral cut.  Soft tissues protected at all times.  Following this, we  subluxed the distal femur off the anterior cortex.  We sized the anterior aspect of the femur to a 5.  This was then pinned in 3 degrees of external rotation.  We performed our anterior, posterior and chamfer cuts.  Soft tissues protected at all times .  Then subluxed the tibia, external alignment guide with a significant varus deformity.  I measured a 9 off the high side, which was the lateral, in line with the first and second ray  parallel to the shaft.  This was then pinned and I performed the tibial  cut.  There was significant sclerotic bone noted posteromedially.  Following this, we checked our flexion and extension gaps and they were tight in flexion and tight medially in extension.  I placed the external alignment guide and slightly increased  the slope to 5 degrees to capture the sclerotic portion medially as it was apparent that there was probably some skiving off of this sclerotic bone.  Additional sclerotic bone was then removed.  We removed osteophytes from the medial proximal tibia as  well.  We had elevated soft tissues medially, preserving the MCL.  With a curved Crego, we had released soft tissues medially as well.  We performed a medial soft tissue release.  We then tried our flexion and extension gaps, still tight in flexion.  We  decided therefore to downsize the femur.  We used a lobster claw, placed the 4 jig up against the distal femur, repinned it, protecting soft tissues at all times and performed our chamfer cuts and the posterior cut of the posterior condyle.  We then  retrialed our flexion and extension gap with a  7 mm insert.  We had full extension and we had stable and symmetric flexion with the 7 mm insert.  Following this, we flexed the knee, subluxed the tibia, sized it to a 5.  External distal medial aspect  of the tibial tubercle.  Pinned it, harvested bone from the  central canal and placed it in the femoral canal.  We then drilled it centrally, the tibia, used our fin punch guide.  Removed the pins.  Turned our attention back to the femur, performing our  box cut.  This bisected the canal.  This was then pinned and we performed our box cut.  Used a rasp within the box cut.  Then placed our trial femur, which sat well.  Placed a 7 mm insert.  This fit well.  Full extension, full flexion, good stability  with varus valgus stressing.  Attention was then turned towards the patella.  It was measured at a 24 plane  to a 16 using an external patellar jig.  This was then measured to a 38.  We medialized the patella and drilled our peg holes.  I placed a trial  patella, reduced the patella and had excellent patellofemoral tracking.  We removed all trials.  Checked posteriorly.  We removed menisci before we cauterized geniculates.  Popliteus and capsule were intact.  We copiously irrigated with pulsatile lavage.   Flexed the knee, subluxed the tibia, dried all surfaces thoroughly.  Mixed cement on back table and then injected it into the tibia, digitally pressurizing it.  I cemented and impacted the 5 tibial tray,  cemented and impacted the 4 femur.  A 7 mm  insert, reduced it and held an axial load throughout the curing of the cement.  We cemented and clamped the patella.  Redundant cement was removed.  Leg was in extension.  We copiously irrigated with antibiotic irrigation.  Marcaine  with epinephrine was  placed into the joint while the cement cured.  After appropriate curing of the cement, the tourniquet was released at 90 minutes.  Any bleeding, which was minimal, was cauterized.  The knee had full extension, full flexion, good stability with varus  valgus stress in 30 degrees, negative anterior drawer.  Excellent patellofemoral tracking.  I removed the trial insert, flexed the knee and meticulously removed all redundant cement and copiously irrigated the joint.  I placed a  7 permanent insert,  reduced the knee and I had full extension, full flexion, good stability to varus valgus stress in 30 degrees, negative anterior drawer.  Good patellofemoral tracking.  With the knee in slight flexion, I then reapproximated the patellar arthrotomy with #1  Vicryl interrupted figure-of-eight sutures.  I then oversewed with a running Stratafix.  Following this, I had full flexion, full extension, good stability to varus valgus stress and excellent patellofemoral tracking.  Following this, I copiously  irrigated the wound.  We placed additional Marcaine with epinephrine into the joint.  Subcutaneous with 2-0 and skin with Monocryl.  Sterile dressing applied, placed in an immobilizer and transferred to the bed and to the recovery room in satisfactory  condition.  The patient tolerated the procedure well.  No complications.  Blood loss was 100 mL.  VN/NUANCE  D:10/02/2020 T:10/02/2020 JOB:[id number]

## 2020-10-02 NOTE — Anesthesia Postprocedure Evaluation (Signed)
Anesthesia Post Note  Patient: Pier Laux  Procedure(s) Performed: TOTAL KNEE ARTHROPLASTY (Left Knee)     Patient location during evaluation: PACU Anesthesia Type: Spinal Level of consciousness: oriented and awake and alert Pain management: pain level controlled Vital Signs Assessment: post-procedure vital signs reviewed and stable Respiratory status: spontaneous breathing, respiratory function stable and patient connected to nasal cannula oxygen Cardiovascular status: blood pressure returned to baseline and stable Postop Assessment: no headache, no backache and no apparent nausea or vomiting Anesthetic complications: no   No complications documented.  Last Vitals:  Vitals:   10/02/20 1545 10/02/20 1600  BP: 121/70 124/68  Pulse: 85 82  Resp: (!) 32 (!) 27  Temp:    SpO2: 100% 97%    Last Pain:  Vitals:   10/02/20 1545  TempSrc:   PainSc: 0-No pain                 Keldan Eplin DAVID

## 2020-10-02 NOTE — Progress Notes (Signed)
AssistedDr. Lissa Hoard with left, ultrasound guided, adductor canal block. Side rails up, monitors on throughout procedure. See vital signs in flow sheet. Tolerated Procedure well.

## 2020-10-02 NOTE — Anesthesia Procedure Notes (Signed)
Anesthesia Regional Block: Adductor canal block   Pre-Anesthetic Checklist: ,, timeout performed, Correct Patient, Correct Site, Correct Laterality, Correct Procedure, Correct Position, site marked, Risks and benefits discussed,  Surgical consent,  Pre-op evaluation,  At surgeon's request and post-op pain management  Laterality: Left  Prep: chloraprep       Needles:  Injection technique: Single-shot  Needle Type: Stimiplex     Needle Length: 9cm  Needle Gauge: 21     Additional Needles:   Procedures:,,,, ultrasound used (permanent image in chart),,,,  Narrative:  Start time: 10/02/2020 10:54 AM End time: 10/02/2020 10:59 AM Injection made incrementally with aspirations every 5 mL.  Performed by: Personally  Anesthesiologist: Nolon Nations, MD  Additional Notes: BP cuff, EKG monitors applied. Sedation begun. Artery and nerve location verified with U/S and anesthetic injected incrementally, slowly, and after negative aspirations under direct u/s guidance. Good fascial /perineural spread. Tolerated well.

## 2020-10-03 ENCOUNTER — Encounter (HOSPITAL_COMMUNITY): Payer: Self-pay | Admitting: Specialist

## 2020-10-03 LAB — CBC
HCT: 39.6 % (ref 36.0–46.0)
Hemoglobin: 13.1 g/dL (ref 12.0–15.0)
MCH: 27.6 pg (ref 26.0–34.0)
MCHC: 33.1 g/dL (ref 30.0–36.0)
MCV: 83.4 fL (ref 80.0–100.0)
Platelets: 271 10*3/uL (ref 150–400)
RBC: 4.75 MIL/uL (ref 3.87–5.11)
RDW: 12.4 % (ref 11.5–15.5)
WBC: 10.8 10*3/uL — ABNORMAL HIGH (ref 4.0–10.5)
nRBC: 0 % (ref 0.0–0.2)

## 2020-10-03 LAB — BASIC METABOLIC PANEL
Anion gap: 11 (ref 5–15)
BUN: 16 mg/dL (ref 8–23)
CO2: 22 mmol/L (ref 22–32)
Calcium: 8.7 mg/dL — ABNORMAL LOW (ref 8.9–10.3)
Chloride: 103 mmol/L (ref 98–111)
Creatinine, Ser: 0.71 mg/dL (ref 0.44–1.00)
GFR, Estimated: >60 mL/min (ref >60)
Glucose, Bld: 146 mg/dL — ABNORMAL HIGH (ref 70–99)
Potassium: 4 mmol/L (ref 3.5–5.1)
Sodium: 136 mmol/L (ref 135–145)

## 2020-10-03 NOTE — Progress Notes (Signed)
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  Follow Up Recommendations  Follow surgeon's recommendation for DC plan and follow-up therapies     Equipment Recommendations  None recommended by PT    Recommendations for Other Services       Precautions / Restrictions Precautions Precautions: Knee;Fall Required Braces or Orthoses: Knee Immobilizer - Left Knee Immobilizer - Left: Discontinue once straight leg raise with < 10 degree lag Restrictions Weight Bearing Restrictions: No    Mobility  Bed Mobility Overal bed mobility: Needs Assistance Bed Mobility: Supine to Sit;Sit to Supine     Supine to sit: Min guard Sit to supine: Min assist   General bed mobility comments: cues for sequence and min assist to manage L LE  Transfers Overall transfer level: Needs assistance Equipment used: Rolling walker (2 wheeled) Transfers: Sit to/from Stand Sit to Stand: Min guard         General transfer comment: cues for LE management and use of UEs to self assist;  Ambulation/Gait Ambulation/Gait assistance: Min assist;Min guard Gait Distance (Feet): 100 Feet Assistive device: Rolling walker (2 wheeled) Gait Pattern/deviations: Step-to pattern;Decreased step length - right;Decreased step length - left;Shuffle;Decreased stance time - left;Trunk flexed Gait velocity: decr   General Gait Details: cues for sequence, posture and position from RW; distance ltd by pain   Stairs             Wheelchair Mobility    Modified Rankin (Stroke Patients Only)       Balance Overall balance assessment: Needs assistance Sitting-balance support: No upper extremity supported;Feet supported Sitting balance-Leahy  Scale: Good     Standing balance support: Bilateral upper extremity supported Standing balance-Leahy Scale: Poor                              Cognition Arousal/Alertness: Awake/alert Behavior During Therapy: WFL for tasks assessed/performed Overall Cognitive Status: Within Functional Limits for tasks assessed                                        Exercises      General Comments        Pertinent Vitals/Pain Pain Assessment: 0-10 Pain Score: 7  Pain Location: L knee Pain Descriptors / Indicators: Aching;Sore;Guarding;Grimacing Pain Intervention(s): Limited activity within patient's tolerance;Monitored during session;Premedicated before session;Ice applied    Home Living                      Prior Function            PT Goals (current goals can now be found in the care plan section) Acute Rehab PT Goals Patient Stated Goal: Regain IND PT Goal Formulation: With patient Time For Goal Achievement: 10/10/20 Potential to Achieve Goals: Good Progress towards PT goals: Progressing toward goals    Frequency    7X/week      PT Plan Current plan remains appropriate    Co-evaluation              AM-PAC PT "6 Clicks" Mobility   Outcome Measure  Help needed turning from your back to your side while in a flat bed without using bedrails?: A Little Help needed moving from lying on your back to sitting on the side of a flat bed without using bedrails?: A Little Help needed moving to and from a bed to a chair (including a wheelchair)?: A Little Help needed standing up from a chair using your arms (e.g., wheelchair or bedside chair)?: A Little Help needed to walk in hospital room?: A Little Help needed climbing 3-5 steps with a railing? : A Lot 6 Click Score: 17    End of Session Equipment Utilized During Treatment: Gait belt;Left knee immobilizer Activity Tolerance: Patient limited by pain Patient left: in bed;with call  bell/phone within reach;with bed alarm set Nurse Communication: Mobility status PT Visit Diagnosis: Difficulty in walking, not elsewhere classified (R26.2)     Time: 1683-7290 PT Time Calculation (min) (ACUTE ONLY): 20 min  Charges:  $Gait Training: 8-22 mins                     [place] Pager [phone] Office [phone]    [name] 10/03/2020, 4:33 PM

## 2020-10-03 NOTE — Progress Notes (Signed)
Subjective: 1 Day Post-Op Procedure(s) (LRB): TOTAL KNEE ARTHROPLASTY (Left) Patient reports pain as 4 on 0-10 scale.    Patient has complaints of none  We will start therapy today. Plan is to go home after hospital stay.  Objective: Vital signs in last 24 hours: Temp:  [97.4 F (36.3 C)-97.7 F (36.5 C)] 97.7 F (36.5 C) (10/21 0915) Pulse Rate:  [63-92] 67 (10/21 0915) Resp:  [15-32] 15 (10/21 0915) BP: (110-140)/(66-84) 140/68 (10/21 0915) SpO2:  [96 %-100 %] 98 % (10/21 0915)  Intake/Output from previous day:  Intake/Output Summary (Last 24 hours) at 10/03/2020 1113 Last data filed at 10/03/2020 1023 Gross per 24 hour  Intake 3420.09 ml  Output 3920 ml  Net -499.91 ml    Intake/Output this shift: Total I/O In: 240 [P.O.:240] Out: 400 [Emesis/NG output:400]  Labs: Results for orders placed or performed during the hospital encounter of 10/02/20  CBC  Result Value Ref Range   WBC 10.8 (H) 4.0 - 10.5 K/uL   RBC 4.75 3.87 - 5.11 MIL/uL   Hemoglobin 13.1 12.0 - 15.0 g/dL   HCT 39.6 36 - 46 %   MCV 83.4 80.0 - 100.0 fL   MCH 27.6 26.0 - 34.0 pg   MCHC 33.1 30.0 - 36.0 g/dL   RDW 12.4 11.5 - 15.5 %   Platelets 271 150 - 400 K/uL   nRBC 0.0 0.0 - 0.2 %  Basic metabolic panel  Result Value Ref Range   Sodium 136 135 - 145 mmol/L   Potassium 4.0 3.5 - 5.1 mmol/L   Chloride 103 98 - 111 mmol/L   CO2 22 22 - 32 mmol/L   Glucose, Bld 146 (H) 70 - 99 mg/dL   BUN 16 8 - 23 mg/dL   Creatinine, Ser 0.71 0.44 - 1.00 mg/dL   Calcium 8.7 (L) 8.9 - 10.3 mg/dL   GFR, Estimated >60 >60 mL/min   Anion gap 11 5 - 15    Exam - Neurologically intact Neurovascular intact Sensation intact distally Dorsiflexion/Plantar flexion intact Compartment soft Dressing dry Dressing - no drainage Motor function intact - moving foot and toes well on exam.    Assessment/Plan: 1 Day Post-Op Procedure(s) (LRB): TOTAL KNEE ARTHROPLASTY (Left)  Doing well  Advance diet Up with  therapy D/C IV fluids Plan for discharge tomorrow Past Medical History:  Diagnosis Date   Allergy    Anxiety    Arthritis    Asthma    Hypertension     DVT Prophylaxis -asa Weight-Bearing as tolerated to left No vaccines.   Johnn Hai 10/03/2020, 11:13 AM

## 2020-10-03 NOTE — Evaluation (Signed)
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  Pt should progress to dc home with assist of family and friends.      Follow Up Recommendations Follow surgeon's recommendation for DC plan and follow-up therapies    Equipment Recommendations  None recommended by PT    Recommendations for Other Services       Precautions / Restrictions Precautions Precautions: Knee;Fall Required Braces or Orthoses: Knee Immobilizer - Left Knee Immobilizer - Left: Discontinue once straight leg raise with < 10 degree lag Restrictions Weight Bearing Restrictions: No      Mobility  Bed Mobility Overal bed mobility: Needs Assistance Bed Mobility: Sit to Supine       Sit to supine: Min assist   General bed mobility comments: cues for sequence and min assist to manage L LE    Transfers Overall transfer level: Needs assistance Equipment used: Rolling walker (2 wheeled) Transfers: Sit to/from [facility] Sit to Stand: Min assist Stand pivot transfers: Min assist       General transfer comment: cues for LE management and use of UEs to self assist; stand pvt recliner to bedside  Ambulation/Gait Ambulation/Gait assistance: Min assist Gait Distance (Feet): 35 Feet Assistive device: Rolling walker (2 wheeled) Gait Pattern/deviations: Step-to pattern;Decreased step length - right;Decreased step length - left;Shuffle;Decreased stance time - left;Trunk flexed Gait velocity: decr   General Gait Details: cues for sequence, posture and position from RW; distance ltd by pain  Stairs            Wheelchair Mobility    Modified Rankin (Stroke Patients Only)       Balance Overall balance  assessment: Needs assistance Sitting-balance support: No upper extremity supported;Feet supported Sitting balance-Leahy Scale: Good     Standing balance support: Bilateral upper extremity supported Standing balance-Leahy Scale: Poor                               Pertinent Vitals/Pain Pain Assessment: 0-10 Pain Score: 8  Pain Location: L knee Pain Descriptors / Indicators: Aching;Sore;Guarding;Grimacing Pain Intervention(s): Limited activity within patient's tolerance;Monitored during session;Premedicated before session;Patient requesting pain meds-RN notified;Ice applied    Home Living Family/patient expects to be discharged to:: Private residence Living Arrangements: Alone Available Help at Discharge: Family;Friend(s);Available PRN/intermittently Type of Home: Apartment Home Access: Stairs to enter Entrance Stairs-Rails: None Entrance Stairs-Number of Steps: 1 Home Layout: One level Home Equipment: Walker - 2 wheels;Bedside commode      Prior Function Level of Independence: Independent               Hand Dominance        Extremity/Trunk Assessment   Upper Extremity Assessment Upper Extremity Assessment: Overall WFL for tasks assessed    Lower Extremity Assessment Lower Extremity Assessment: LLE deficits/detail       Communication   Communication: No difficulties  Cognition Arousal/Alertness: Awake/alert Behavior During Therapy: WFL for tasks assessed/performed Overall Cognitive Status: Within Functional Limits for tasks assessed  General Comments      Exercises     Assessment/Plan    PT Assessment Patient needs continued PT services  PT Problem List Decreased strength;Decreased range of motion;Decreased activity tolerance;Decreased balance;Decreased mobility;Decreased knowledge of use of DME;Pain       PT Treatment Interventions DME instruction;Gait training;Stair  training;Functional mobility training;Therapeutic activities;Therapeutic exercise;Patient/family education    PT Goals (Current goals can be found in the Care Plan section)  Acute Rehab PT Goals Patient Stated Goal: Regain IND PT Goal Formulation: With patient Time For Goal Achievement: 10/10/20 Potential to Achieve Goals: Good    Frequency 7X/week   Barriers to discharge Decreased caregiver support Pt does not have 24/7    Co-evaluation               AM-PAC PT "6 Clicks" Mobility  Outcome Measure Help needed turning from your back to your side while in a flat bed without using bedrails?: A Little Help needed moving from lying on your back to sitting on the side of a flat bed without using bedrails?: A Little Help needed moving to and from a bed to a chair (including a wheelchair)?: A Little Help needed standing up from a chair using your arms (e.g., wheelchair or bedside chair)?: A Little Help needed to walk in hospital room?: A Little Help needed climbing 3-5 steps with a railing? : A Lot 6 Click Score: 17    End of Session Equipment Utilized During Treatment: Gait belt;Left knee immobilizer Activity Tolerance: Patient limited by pain Patient left: in bed;with call bell/phone within reach;with bed alarm set Nurse Communication: Mobility status PT Visit Diagnosis: Difficulty in walking, not elsewhere classified (R26.2)    Time: 9518-8416 PT Time Calculation (min) (ACUTE ONLY): 20 min   Charges:   PT Evaluation $PT Eval Low Complexity: 1 Low          [place] Pager [phone] Office [phone]   Collyns Mcquigg 10/03/2020, 12:55 PM

## 2020-10-03 NOTE — Plan of Care (Signed)
  Problem: Activity: Goal: Ability to avoid complications of mobility impairment will improve Outcome: Progressing Goal: Range of joint motion will improve Outcome: Progressing   Problem: Pain Management: Goal: Pain level will decrease with appropriate interventions Outcome: Progressing   Problem: Activity: Goal: Risk for activity intolerance will decrease Outcome: Progressing   Problem: Elimination: Goal: Will not experience complications related to bowel motility Outcome: Progressing Goal: Will not experience complications related to urinary retention Outcome: Progressing   Problem: Pain Managment: Goal: General experience of comfort will improve Outcome: Progressing   Problem: Safety: Goal: Ability to remain free from injury will improve Outcome: Progressing

## 2020-10-03 NOTE — Plan of Care (Signed)

## 2020-10-04 LAB — CBC
HCT: 38.6 % (ref 36.0–46.0)
Hemoglobin: 12.3 g/dL (ref 12.0–15.0)
MCH: 27.2 pg (ref 26.0–34.0)
MCHC: 31.9 g/dL (ref 30.0–36.0)
MCV: 85.4 fL (ref 80.0–100.0)
Platelets: 279 10*3/uL (ref 150–400)
RBC: 4.52 MIL/uL (ref 3.87–5.11)
RDW: 12.7 % (ref 11.5–15.5)
WBC: 11.6 10*3/uL — ABNORMAL HIGH (ref 4.0–10.5)
nRBC: 0 % (ref 0.0–0.2)

## 2020-10-04 MED ORDER — ONDANSETRON HCL 4 MG PO TABS: 4.0000 mg | ORAL_TABLET | Freq: Four times a day (QID) | ORAL | 1 refills | Status: DC | PRN

## 2020-10-04 MED ORDER — POLYETHYLENE GLYCOL 3350 17 G PO PACK: 17.0000 g | PACK | Freq: Every day | ORAL | 0 refills | Status: DC | PRN

## 2020-10-04 MED ORDER — ASPIRIN EC 81 MG PO TBEC: 81.0000 mg | DELAYED_RELEASE_TABLET | Freq: Two times a day (BID) | ORAL | 11 refills | Status: AC

## 2020-10-04 MED ORDER — OXYCODONE HCL 5 MG PO TABS
5.0000 mg | ORAL_TABLET | ORAL | 0 refills | Status: DC | PRN
Start: 2020-10-04 — End: 2022-07-23

## 2020-10-04 MED ORDER — DOCUSATE SODIUM 100 MG PO CAPS
100.0000 mg | ORAL_CAPSULE | Freq: Two times a day (BID) | ORAL | 0 refills | Status: DC
Start: 2020-10-04 — End: 2022-07-23

## 2020-10-04 MED ORDER — METHOCARBAMOL 500 MG PO TABS: 500.0000 mg | ORAL_TABLET | Freq: Four times a day (QID) | ORAL | 1 refills | Status: DC | PRN

## 2020-10-04 NOTE — TOC Transition Note (Signed)
Transition of Care [facility]) - CM/SW Discharge Note   Patient Details  Name: Nicole Reyes MRN: [id number] Date of Birth: 1956/06/12  [facility] CM/SW Contact:  Lennart Pall, LCSW Phone Number: 10/04/2020, 1:13 PM   Clinical Narrative:    Met with pt to review dc needs.  Pt confirms she has all needed DME.  HHPT recommended and pt without agency preference - referral placed with [facility].  No further TOC needs.   Final next level of care: [place] Barriers to Discharge: Barriers Resolved   Patient Goals and CMS Choice Patient states their goals for this hospitalization and ongoing recovery are:: return home      Discharge Placement                       Discharge Plan and Services                DME Arranged: N/A DME Agency: NA       HH Arranged: PT [place] Agency: [place] ([place]) Date HH Agency Contacted: 10/04/20 Time [place]: 1200 Representative spoke with at [place]: [place] ([place]) Interventions     Readmission Risk Interventions No flowsheet data found.

## 2020-10-04 NOTE — Progress Notes (Signed)
Physical Therapy Treatment Patient Details Name: Nicole Reyes MRN: [id number] DOB: [birth year] Today's Date: 10/04/2020    History of Present Illness Pt s/p L TKR and with hx of R TKR in 2019    PT Comments    Pt up to ambulate limited distance in hall - min c/o nausea and pain elevated to 6/10.   Follow Up Recommendations  Follow surgeon's recommendation for DC plan and follow-up therapies     Equipment Recommendations  None recommended by PT    Recommendations for Other Services       Precautions / Restrictions Precautions Precautions: Knee;Fall Required Braces or Orthoses: Knee Immobilizer - Left Knee Immobilizer - Left: Discontinue once straight leg raise with < 10 degree lag Restrictions Weight Bearing Restrictions: No    Mobility  Bed Mobility Overal bed mobility: Needs Assistance Bed Mobility: Supine to Sit;Sit to Supine     Supine to sit: Min guard Sit to supine: Min assist   General bed mobility comments: Increased time with assist for L LE  Transfers Overall transfer level: Needs assistance Equipment used: Rolling walker (2 wheeled) Transfers: Sit to/from Stand Sit to Stand: Min guard         General transfer comment: cues for LE management and use of UEs to self assist;  Ambulation/Gait Ambulation/Gait assistance: Min assist;Min guard Gait Distance (Feet): 75 Feet Assistive device: Rolling walker (2 wheeled) Gait Pattern/deviations: Step-to pattern;Decreased step length - right;Decreased step length - left;Shuffle;Decreased stance time - left;Trunk flexed     General Gait Details: cues for sequence, posture and position from RW; distance ltd by pain   Stairs             Wheelchair Mobility    Modified Rankin (Stroke Patients Only)       Balance Overall balance assessment: Needs assistance Sitting-balance support: No upper extremity supported;Feet supported Sitting balance-Leahy Scale: Good     Standing balance  support: Bilateral upper extremity supported Standing balance-Leahy Scale: Poor                              Cognition Arousal/Alertness: Awake/alert Behavior During Therapy: WFL for tasks assessed/performed Overall Cognitive Status: Within Functional Limits for tasks assessed                                        Exercises Total Joint Exercises Ankle Circles/Pumps: AROM;Both;15 reps;Supine Quad Sets: AROM;Both;10 reps;Supine Heel Slides: AAROM;Left;15 reps;Supine Straight Leg Raises: AAROM;Left;20 reps;Supine Goniometric ROM: AAROM L knee -5 - 35 pain ltd with muscle guarding    General Comments        Pertinent Vitals/Pain Pain Assessment: 0-10 Pain Score: 6  Pain Location: L knee Pain Descriptors / Indicators: Aching;Sore;Guarding;Grimacing Pain Intervention(s): Limited activity within patient's tolerance;Monitored during session;Premedicated before session;Ice applied    Home Living                      Prior Function            PT Goals (current goals can now be found in the care plan section) Acute Rehab PT Goals Patient Stated Goal: Regain IND PT Goal Formulation: With patient Time For Goal Achievement: 10/10/20 Potential to Achieve Goals: Good Progress towards PT goals: Progressing toward goals    Frequency    7X/week      PT Plan  Current plan remains appropriate    Co-evaluation              AM-PAC PT "6 Clicks" Mobility   Outcome Measure  Help needed turning from your back to your side while in a flat bed without using bedrails?: A Little Help needed moving from lying on your back to sitting on the side of a flat bed without using bedrails?: A Little Help needed moving to and from a bed to a chair (including a wheelchair)?: A Little Help needed standing up from a chair using your arms (e.g., wheelchair or bedside chair)?: A Little Help needed to walk in hospital room?: A Little Help needed climbing  3-5 steps with a railing? : A Lot 6 Click Score: 17    End of Session Equipment Utilized During Treatment: Gait belt;Left knee immobilizer Activity Tolerance: Patient tolerated treatment well;Patient limited by fatigue Patient left: in bed;with call bell/phone within reach;with bed alarm set Nurse Communication: Mobility status PT Visit Diagnosis: Difficulty in walking, not elsewhere classified (R26.2)     Time: 4270-6237 PT Time Calculation (min) (ACUTE ONLY): 20 min  Charges:  $Gait Training: 8-22 mins $Therapeutic Exercise: 8-22 mins                     [place] Pager [phone] Office [phone]    Nicole Reyes 10/04/2020, 1:00 PM

## 2020-10-04 NOTE — Plan of Care (Signed)
  Problem: Pain Management: Goal: Pain level will decrease with appropriate interventions Outcome: Progressing   

## 2020-10-04 NOTE — Progress Notes (Signed)
Subjective: 2 Days Post-Op Procedure(s) (LRB): TOTAL KNEE ARTHROPLASTY (Left) Patient reports pain as moderate and severe.   Reports nausea, knee pain. No other c/o  Objective: Vital signs in last 24 hours: Temp:  [97.7 F (36.5 C)-98.7 F (37.1 C)] 97.7 F (36.5 C) (10/22 0656) Pulse Rate:  [57-79] 79 (10/22 0656) Resp:  [15-17] 17 (10/22 0656) BP: (93-150)/(62-81) 144/77 (10/22 0818) SpO2:  [96 %-100 %] 99 % (10/22 0656)  Intake/Output from previous day: 10/21 0701 - 10/22 0700 In: 1170.3 [P.O.:960; I.V.:210.3] Out: 825 [Urine:425; Emesis/NG output:400] Intake/Output this shift: No intake/output data recorded.  Recent Labs    10/03/20 0312 10/04/20 0301  HGB 13.1 12.3   Recent Labs    10/03/20 0312 10/04/20 0301  WBC 10.8* 11.6*  RBC 4.75 4.52  HCT 39.6 38.6  PLT 271 279   Recent Labs    10/03/20 0312  NA 136  K 4.0  CL 103  CO2 22  BUN 16  CREATININE 0.71  GLUCOSE 146*  CALCIUM 8.7*   No results for input(s): LABPT, INR in the last 72 hours.  Neurologically intact ABD soft Neurovascular intact Sensation intact distally Intact pulses distally Dorsiflexion/Plantar flexion intact Incision: dressing C/D/I and scant drainage No cellulitis present Compartment soft no calf pain or sign of DVT   Assessment/Plan: 2 Days Post-Op Procedure(s) (LRB): TOTAL KNEE ARTHROPLASTY (Left) Advance diet Up with therapy Plan D/C either later today or more likely tomorrow home with HHPT Will discuss with Dr. Valentino Saxon Nicole Reyes 10/04/2020, 8:31 AM

## 2020-10-04 NOTE — Plan of Care (Signed)
  Problem: Activity: Goal: Ability to avoid complications of mobility impairment will improve Outcome: Progressing Goal: Range of joint motion will improve Outcome: Progressing   Problem: Pain Management: Goal: Pain level will decrease with appropriate interventions Outcome: Progressing   Problem: Activity: Goal: Risk for activity intolerance will decrease Outcome: Progressing   Problem: Elimination: Goal: Will not experience complications related to bowel motility Outcome: Progressing Goal: Will not experience complications related to urinary retention Outcome: Progressing   Problem: Pain Managment: Goal: General experience of comfort will improve Outcome: Progressing   Problem: Safety: Goal: Ability to remain free from injury will improve Outcome: Progressing

## 2020-10-04 NOTE — Progress Notes (Signed)
Physical Therapy Treatment Patient Details Name: Nicole Reyes MRN: [id number] DOB: [birth year] Today's Date: 10/04/2020    History of Present Illness Pt s/p L TKR and with hx of R TKR in 2019    PT Comments    Pt progressing slowly but steadily with mobility but continues limited by nausea and pain.   Follow Up Recommendations  Follow surgeon's recommendation for DC plan and follow-up therapies     Equipment Recommendations  None recommended by PT    Recommendations for Other Services       Precautions / Restrictions Precautions Precautions: Knee;Fall Required Braces or Orthoses: Knee Immobilizer - Left Knee Immobilizer - Left: Discontinue once straight leg raise with < 10 degree lag Restrictions Weight Bearing Restrictions: No    Mobility  Bed Mobility Overal bed mobility: Needs Assistance Bed Mobility: Supine to Sit;Sit to Supine     Supine to sit: Min guard Sit to supine: Min guard   General bed mobility comments: increased time, pt self assisting L LE with belt  Transfers Overall transfer level: Needs assistance Equipment used: Rolling walker (2 wheeled) Transfers: Sit to/from Stand Sit to Stand: Min guard         General transfer comment: cues for LE management and use of UEs to self assist;  Ambulation/Gait Ambulation/Gait assistance: Min guard Gait Distance (Feet): 80 Feet Assistive device: Rolling walker (2 wheeled) Gait Pattern/deviations: Step-to pattern;Decreased step length - right;Decreased step length - left;Shuffle;Decreased stance time - left;Trunk flexed Gait velocity: decr   General Gait Details: cues for sequence, posture and position from RW; distance ltd by pain   Stairs             Wheelchair Mobility    Modified Rankin (Stroke Patients Only)       Balance Overall balance assessment: Needs assistance Sitting-balance support: No upper extremity supported;Feet supported Sitting balance-Leahy Scale: Good      Standing balance support: Bilateral upper extremity supported Standing balance-Leahy Scale: Poor                              Cognition Arousal/Alertness: Awake/alert Behavior During Therapy: WFL for tasks assessed/performed Overall Cognitive Status: Within Functional Limits for tasks assessed                                        Exercises      General Comments        Pertinent Vitals/Pain Pain Assessment: 0-10 Pain Score: 5  Pain Location: L knee Pain Descriptors / Indicators: Aching;Sore;Guarding;Grimacing Pain Intervention(s): Limited activity within patient's tolerance;Monitored during session;Premedicated before session;Ice applied    Home Living                      Prior Function            PT Goals (current goals can now be found in the care plan section) Acute Rehab PT Goals Patient Stated Goal: Regain IND PT Goal Formulation: With patient Time For Goal Achievement: 10/10/20 Potential to Achieve Goals: Good Progress towards PT goals: Progressing toward goals    Frequency    7X/week      PT Plan Current plan remains appropriate    Co-evaluation              AM-PAC PT "6 Clicks" Mobility   Outcome Measure  Help  needed turning from your back to your side while in a flat bed without using bedrails?: A Little Help needed moving from lying on your back to sitting on the side of a flat bed without using bedrails?: A Little Help needed moving to and from a bed to a chair (including a wheelchair)?: A Little Help needed standing up from a chair using your arms (e.g., wheelchair or bedside chair)?: A Little Help needed to walk in hospital room?: A Little Help needed climbing 3-5 steps with a railing? : A Lot 6 Click Score: 17    End of Session Equipment Utilized During Treatment: Gait belt;Left knee immobilizer Activity Tolerance: Patient tolerated treatment well;Patient limited by fatigue Patient left: in  bed;with call bell/phone within reach;with bed alarm set Nurse Communication: Mobility status PT Visit Diagnosis: Difficulty in walking, not elsewhere classified (R26.2)     Time: 0045-9977 PT Time Calculation (min) (ACUTE ONLY): 20 min  Charges:  $Gait Training: 8-22 mins                     [place] Pager [phone] Office [phone]    Jaemarie Hochberg 10/04/2020, 5:22 PM

## 2020-10-04 NOTE — Progress Notes (Signed)
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  Pt ltd this morning by pain and nausea.  OOB deferred to allow rest break at pt request.   Follow Up Recommendations  Follow surgeon's recommendation for DC plan and follow-up therapies     Equipment Recommendations  None recommended by PT    Recommendations for Other Services       Precautions / Restrictions Precautions Precautions: Knee;Fall Required Braces or Orthoses: Knee Immobilizer - Left Knee Immobilizer - Left: Discontinue once straight leg raise with < 10 degree lag Restrictions Weight Bearing Restrictions: No    Mobility  Bed Mobility               General bed mobility comments: OOB deferred until later at pt request  Transfers                    Ambulation/Gait                 Stairs             Wheelchair Mobility    Modified Rankin (Stroke Patients Only)       Balance                                            Cognition Arousal/Alertness: Awake/alert Behavior During Therapy: WFL for tasks assessed/performed Overall Cognitive Status: Within Functional Limits for tasks assessed                                        Exercises Total Joint Exercises Ankle Circles/Pumps: AROM;Both;15 reps;Supine Quad Sets: AROM;Both;10 reps;Supine Heel Slides: AAROM;Left;15 reps;Supine Straight Leg Raises: AAROM;Left;20 reps;Supine Goniometric ROM: AAROM L knee -5 - 35 pain ltd with muscle guarding    General Comments        Pertinent Vitals/Pain Pain Assessment: 0-10 Pain Score: 6  Pain Location: L knee Pain Descriptors / Indicators: Aching;Sore;Guarding;Grimacing Pain Intervention(s): Limited activity within  patient's tolerance;Monitored during session;Premedicated before session;Ice applied    Home Living                      Prior Function            PT Goals (current goals can now be found in the care plan section) Acute Rehab PT Goals Patient Stated Goal: Regain IND PT Goal Formulation: With patient Time For Goal Achievement: 10/10/20 Potential to Achieve Goals: Good Progress towards PT goals: Progressing toward goals    Frequency    7X/week      PT Plan Current plan remains appropriate    Co-evaluation              AM-PAC PT "6 Clicks" Mobility   Outcome Measure  Help needed turning from your back to your side while in a flat bed without using bedrails?: A Little Help needed moving from lying on your back to sitting on the side of a flat bed without using bedrails?: A Little Help needed moving to and from a bed to a  chair (including a wheelchair)?: A Little Help needed standing up from a chair using your arms (e.g., wheelchair or bedside chair)?: A Little Help needed to walk in hospital room?: A Little Help needed climbing 3-5 steps with a railing? : A Lot 6 Click Score: 17    End of Session   Activity Tolerance: Patient limited by pain Patient left: in bed;with call bell/phone within reach;with bed alarm set Nurse Communication: Mobility status PT Visit Diagnosis: Difficulty in walking, not elsewhere classified (R26.2)     Time: 2341-4436 PT Time Calculation (min) (ACUTE ONLY): 19 min  Charges:  $Therapeutic Exercise: 8-22 mins                     [place] Pager [phone] Office [phone]    Vallery Mcdade 10/04/2020, 10:42 AM

## 2020-10-05 LAB — CBC
HCT: 38.6 % (ref 36.0–46.0)
Hemoglobin: 12.4 g/dL (ref 12.0–15.0)
MCH: 27.1 pg (ref 26.0–34.0)
MCHC: 32.1 g/dL (ref 30.0–36.0)
MCV: 84.5 fL (ref 80.0–100.0)
Platelets: 291 10*3/uL (ref 150–400)
RBC: 4.57 MIL/uL (ref 3.87–5.11)
RDW: 12.7 % (ref 11.5–15.5)
WBC: 7.8 10*3/uL (ref 4.0–10.5)
nRBC: 0 % (ref 0.0–0.2)

## 2020-10-05 NOTE — Progress Notes (Addendum)
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  Written instruction for step up and HEP provided and reviewed.  Pt states feeling much better and eager for return home.   Follow Up Recommendations  Follow surgeon's recommendation for DC plan and follow-up therapies     Equipment Recommendations  None recommended by PT    Recommendations for Other Services       Precautions / Restrictions Precautions Precautions: Knee;Fall Required Braces or Orthoses: Knee Immobilizer - Left Knee Immobilizer - Left: Discontinue once straight leg raise with < 10 degree lag Restrictions Weight Bearing Restrictions: No LLE Weight Bearing: Weight bearing as tolerated    Mobility  Bed Mobility Overal bed mobility: Needs Assistance Bed Mobility: Supine to Sit       Sit to supine: Min guard   General bed mobility comments: Pt up to EOB using gait belt to assist L LE  Transfers Overall transfer level: Needs assistance Equipment used: Rolling walker (2 wheeled) Transfers: Sit to/from Stand Sit to Stand: Supervision         General transfer comment: cues for LE management and use of UEs to self assist;  Ambulation/Gait Ambulation/Gait assistance: Min guard;Supervision Gait Distance (Feet): 75 Feet Assistive device: Rolling walker (2 wheeled) Gait Pattern/deviations: Step-to pattern;Decreased step length - right;Decreased step length - left;Shuffle;Decreased stance time - left;Trunk flexed Gait velocity: decr   General Gait Details: cues for sequence, posture and position from RW   Stairs Stairs: Yes Stairs assistance: Min assist Stair Management: No rails;Step to pattern;Backwards;With walker Number of Stairs: 3 General  stair comments: single step x 3 with cues for sequence and foot RW placement   Wheelchair Mobility    Modified Rankin (Stroke Patients Only)       Balance Overall balance assessment: Needs assistance Sitting-balance support: No upper extremity supported;Feet supported Sitting balance-Leahy Scale: Good     Standing balance support: No upper extremity supported Standing balance-Leahy Scale: Fair                              Cognition Arousal/Alertness: Awake/alert Behavior During Therapy: WFL for tasks assessed/performed Overall Cognitive Status: Within Functional Limits for tasks assessed                                        Exercises      General Comments        Pertinent Vitals/Pain Pain Assessment: 0-10 Pain Score: 5  Pain Location: L knee Pain Descriptors / Indicators: Aching;Sore Pain Intervention(s): Limited activity within patient's tolerance;Monitored during session;Premedicated before session;Ice applied    Home Living                      Prior Function            PT Goals (current goals can now be found in the care plan section) Acute Rehab PT Goals Patient Stated Goal: Regain IND PT Goal Formulation: With patient Time For Goal Achievement: 10/10/20 Potential to Achieve Goals: Good Progress towards PT goals: Progressing toward goals  Frequency    7X/week      PT Plan Current plan remains appropriate    Co-evaluation              AM-PAC PT "6 Clicks" Mobility   Outcome Measure  Help needed turning from your back to your side while in a flat bed without using bedrails?: A Little Help needed moving from lying on your back to sitting on the side of a flat bed without using bedrails?: A Little Help needed moving to and from a bed to a chair (including a wheelchair)?: A Little Help needed standing up from a chair using your arms (e.g., wheelchair or bedside chair)?: A Little Help needed to  walk in hospital room?: A Little Help needed climbing 3-5 steps with a railing? : A Little 6 Click Score: 18    End of Session Equipment Utilized During Treatment: Gait belt Activity Tolerance: Patient tolerated treatment well;Patient limited by fatigue Patient left: in chair;with call bell/phone within reach Nurse Communication: Mobility status PT Visit Diagnosis: Difficulty in walking, not elsewhere classified (R26.2)     Time: 7628-3151 PT Time Calculation (min) (ACUTE ONLY): 23 min  Charges:  $Gait Training: 8-22 mins                     [place] Pager [phone] Office [phone]    Oluwatamilore Starnes 10/05/2020, 3:54 PM

## 2020-10-05 NOTE — Progress Notes (Signed)
Subjective: 3 Days Post-Op Procedure(s) (LRB): TOTAL KNEE ARTHROPLASTY (Left) Patient reports pain as 5 on 0-10 scale.   +void -Flatus, -BM Nausea improved today, tolerating PO +ambulation with PT yesterday, none yet today. Denies calf pain, CP, SOB  Objective: Vital signs in last 24 hours: Temp:  [97.5 F (36.4 C)-98.4 F (36.9 C)] 98.4 F (36.9 C) (10/23 0615) Pulse Rate:  [85-91] 89 (10/23 0615) Resp:  [16-20] 20 (10/23 0615) BP: (144-159)/(75-80) 150/80 (10/23 0615) SpO2:  [92 %-100 %] 95 % (10/23 0723)  Intake/Output from previous day: 10/22 0701 - 10/23 0700 In: 540 [P.O.:540] Out: 450 [Urine:450] Intake/Output this shift: Total I/O In: -  Out: 500 [Urine:500]  Recent Labs    10/03/20 0312 10/04/20 0301 10/05/20 0335  HGB 13.1 12.3 12.4   Recent Labs    10/04/20 0301 10/05/20 0335  WBC 11.6* 7.8  RBC 4.52 4.57  HCT 38.6 38.6  PLT 279 291   Recent Labs    10/03/20 0312  NA 136  K 4.0  CL 103  CO2 22  BUN 16  CREATININE 0.71  GLUCOSE 146*  CALCIUM 8.7*   No results for input(s): LABPT, INR in the last 72 hours.  Neurologically intact ABD soft Neurovascular intact Sensation intact distally Intact pulses distally Dorsiflexion/Plantar flexion intact Incision: dressing C/D/I No cellulitis present Compartment soft No calf tenderness, negative Homan's bilaterally.   Assessment/Plan: 3 Days Post-Op Procedure(s) (LRB): TOTAL KNEE ARTHROPLASTY (Left) Advance diet Up with therapy  DVT PPx: Teds, SCDs, ambulation ASA Encourage IS Plan D/C today after working with PT. Scripts done by [employer]  N Ieisha Gao 10/05/2020, 8:58 AM

## 2020-10-05 NOTE — Progress Notes (Signed)
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  Pt requests rest break prior to attempting gait and stairs.   Follow Up Recommendations  Follow surgeon's recommendation for DC plan and follow-up therapies     Equipment Recommendations  None recommended by PT    Recommendations for Other Services       Precautions / Restrictions Precautions Precautions: Knee;Fall Required Braces or Orthoses: Knee Immobilizer - Left Knee Immobilizer - Left: Discontinue once straight leg raise with < 10 degree lag Restrictions Weight Bearing Restrictions: No LLE Weight Bearing: Weight bearing as tolerated    Mobility  Bed Mobility Overal bed mobility: Needs Assistance Bed Mobility: Supine to Sit     Supine to sit: Supervision     General bed mobility comments: Pt up to EOB using gait belt to assist L LE  Transfers                    Ambulation/Gait                 Stairs             Wheelchair Mobility    Modified Rankin (Stroke Patients Only)       Balance                                            Cognition Arousal/Alertness: Awake/alert Behavior During Therapy: WFL for tasks assessed/performed Overall Cognitive Status: Within Functional Limits for tasks assessed                                        Exercises Total Joint Exercises Ankle Circles/Pumps: AROM;Both;15 reps;Supine Quad Sets: AROM;Both;Supine;20 reps Heel Slides: AAROM;Left;15 reps;Supine Straight Leg Raises: AAROM;Left;20 reps;Supine Goniometric ROM: AAROM L knee -5 - 40    General Comments        Pertinent Vitals/Pain Pain Assessment: 0-10 Pain Score: 6  Pain Location: L knee Pain Descriptors / Indicators:  Aching;Sore;Guarding;Grimacing Pain Intervention(s): Limited activity within patient's tolerance;Monitored during session;Premedicated before session;Ice applied    Home Living                      Prior Function            PT Goals (current goals can now be found in the care plan section) Acute Rehab PT Goals Patient Stated Goal: Regain IND PT Goal Formulation: With patient Time For Goal Achievement: 10/10/20 Potential to Achieve Goals: Good Progress towards PT goals: Progressing toward goals    Frequency    7X/week      PT Plan Current plan remains appropriate    Co-evaluation              AM-PAC PT "6 Clicks" Mobility   Outcome Measure  Help needed turning from your back to your side while in a flat bed without using bedrails?: A Little Help needed moving from lying on your back to sitting on the side of a flat bed without using bedrails?: A Little Help needed moving to and from a bed  to a chair (including a wheelchair)?: A Little Help needed standing up from a chair using your arms (e.g., wheelchair or bedside chair)?: A Little Help needed to walk in hospital room?: A Little Help needed climbing 3-5 steps with a railing? : A Lot 6 Click Score: 17    End of Session   Activity Tolerance: Patient tolerated treatment well;Patient limited by fatigue Patient left: in chair;with call bell/phone within reach Nurse Communication: Mobility status PT Visit Diagnosis: Difficulty in walking, not elsewhere classified (R26.2)     Time: 1051-1110 PT Time Calculation (min) (ACUTE ONLY): 19 min  Charges:  $Therapeutic Exercise: 8-22 mins                     [place] Pager [phone] Office [phone]    Xan Sparkman 10/05/2020, 11:25 AM

## 2020-10-11 ENCOUNTER — Other Ambulatory Visit: Payer: Self-pay | Admitting: Family Medicine

## 2020-10-11 DIAGNOSIS — F411 Generalized anxiety disorder: Secondary | ICD-10-CM

## 2020-10-11 NOTE — Discharge Summary (Signed)
Physician Discharge Summary   Patient ID: Nicole Reyes MRN: [id number] DOB/AGE: 64-Jun-1957 64 y.o.  Admit date: 10/02/2020 Discharge date: 10/05/2020  Primary Diagnosis: left knee primary osteoarthritis  Admission Diagnoses:  Past Medical History:  Diagnosis Date  . Allergy   . Anxiety   . Arthritis   . Asthma   . Hypertension    Discharge Diagnoses:   Active Problems:   Left knee DJD  Estimated body mass index is 37.14 kg/m as calculated from the following:   Height as of this encounter: 5\' 5"  (1.651 m).   Weight as of this encounter: 101.2 kg.  Procedure:  Procedure(s) (LRB): TOTAL KNEE ARTHROPLASTY (Left)   Consults: None  HPI: see H&P Laboratory Data: Admission on 10/02/2020, Discharged on 10/05/2020  Component Date Value Ref Range Status  . WBC 10/03/2020 10.8* 4.0 - 10.5 K/uL Final  . RBC 10/03/2020 4.75  3.87 - 5.11 MIL/uL Final  . Hemoglobin 10/03/2020 13.1  12.0 - 15.0 g/dL Final  . HCT 10/03/2020 39.6  36 - 46 % Final  . MCV 10/03/2020 83.4  80.0 - 100.0 fL Final  . MCH 10/03/2020 27.6  26.0 - 34.0 pg Final  . MCHC 10/03/2020 33.1  30.0 - 36.0 g/dL Final  . RDW 10/03/2020 12.4  11.5 - 15.5 % Final  . Platelets 10/03/2020 271  150 - 400 K/uL Final  . nRBC 10/03/2020 0.0  0.0 - 0.2 % Final   Performed at [facility], [place] [address], [place], [place] [address]  . Sodium 10/03/2020 136  135 - 145 mmol/L Final  . Potassium 10/03/2020 4.0  3.5 - 5.1 mmol/L Final  . Chloride 10/03/2020 103  98 - 111 mmol/L Final  . CO2 10/03/2020 22  22 - 32 mmol/L Final  . Glucose, Bld 10/03/2020 146* 70 - 99 mg/dL Final   Glucose reference range applies only to samples taken after fasting for at least 8 hours.  . BUN 10/03/2020 16  8 - 23 mg/dL Final  . Creatinine, Ser 10/03/2020 0.71  0.44 - 1.00 mg/dL Final  . Calcium 10/03/2020 8.7* 8.9 - 10.3 mg/dL Final  . GFR, Estimated 10/03/2020 >60  >60 mL/min Final  . Anion gap 10/03/2020 11  5 - 15  Final   Performed at [facility], [place] [address], [place], [place] [address]  . WBC 10/04/2020 11.6* 4.0 - 10.5 K/uL Final  . RBC 10/04/2020 4.52  3.87 - 5.11 MIL/uL Final  . Hemoglobin 10/04/2020 12.3  12.0 - 15.0 g/dL Final  . HCT 10/04/2020 38.6  36 - 46 % Final  . MCV 10/04/2020 85.4  80.0 - 100.0 fL Final  . MCH 10/04/2020 27.2  26.0 - 34.0 pg Final  . MCHC 10/04/2020 31.9  30.0 - 36.0 g/dL Final  . RDW 10/04/2020 12.7  11.5 - 15.5 % Final  . Platelets 10/04/2020 279  150 - 400 K/uL Final  . nRBC 10/04/2020 0.0  0.0 - 0.2 % Final   Performed at [facility], [place] [address], [place], [place] [address]  . WBC 10/05/2020 7.8  4.0 - 10.5 K/uL Final  . RBC 10/05/2020 4.57  3.87 - 5.11 MIL/uL Final  . Hemoglobin 10/05/2020 12.4  12.0 - 15.0 g/dL Final  . HCT 10/05/2020 38.6  36 - 46 % Final  . MCV 10/05/2020 84.5  80.0 - 100.0 fL Final  . MCH 10/05/2020 27.1  26.0 - 34.0 pg Final  . MCHC 10/05/2020 32.1  30.0 -  36.0 g/dL Final  . RDW 10/05/2020 12.7  11.5 - 15.5 % Final  . Platelets 10/05/2020 291  150 - 400 K/uL Final  . nRBC 10/05/2020 0.0  0.0 - 0.2 % Final   Performed at [facility], [place] [address] [place], [place] [address]  Hospital Outpatient Visit on 10/01/2020  Component Date Value Ref Range Status  . SARS Coronavirus 2 10/01/2020 NEGATIVE  NEGATIVE Final   Comment: (NOTE) SARS-CoV-2 target nucleic acids are NOT DETECTED.  The SARS-CoV-2 RNA is generally detectable in upper and lower respiratory specimens during the acute phase of infection. Negative results do not preclude SARS-CoV-2 infection, do not rule out co-infections with other pathogens, and should not be used as the sole basis for treatment or other patient management decisions. Negative results must be combined with clinical observations, patient history, and epidemiological information. The expected result is Negative.  Fact Sheet for  Patients: SugarRoll.be  Fact Sheet for Healthcare Providers: https://www.woods-mathews.com/  This test is not yet approved or cleared by the [place] FDA and  has been authorized for detection and/or diagnosis of SARS-CoV-2 by FDA under an Emergency Use Authorization (EUA). This EUA will remain  in effect (meaning this test can be used) for the duration of the COVID-19 declaration under Se                          ction 564(b)(1) of the Act, 21 U.S.C. section 360bbb-3(b)(1), unless the authorization is terminated or revoked sooner.  Performed at [facility], [place] [address] [place], [place] [address]   Hospital Outpatient Visit on 09/24/2020  Component Date Value Ref Range Status  . MRSA, PCR 09/24/2020 NEGATIVE  NEGATIVE Final  . Staphylococcus aureus 09/24/2020 NEGATIVE  NEGATIVE Final   Comment: (NOTE) The Xpert SA Assay (FDA approved for NASAL specimens in patients 34 years of age and older), is one component of a comprehensive surveillance program. It is not intended to diagnose infection nor to guide or monitor treatment. Performed at [facility], [place] [address], [place], [place] [address]   . aPTT 09/24/2020 28  24 - 36 seconds Final   Performed at [facility], [place] [address], [place], [place] [address]  . Sodium 09/24/2020 139  135 - 145 mmol/L Final  . Potassium 09/24/2020 4.5  3.5 - 5.1 mmol/L Final  . Chloride 09/24/2020 105  98 - 111 mmol/L Final  . CO2 09/24/2020 25  22 - 32 mmol/L Final  . Glucose, Bld 09/24/2020 107* 70 - 99 mg/dL Final   Glucose reference range applies only to samples taken after fasting for at least 8 hours.  . BUN 09/24/2020 19  8 - 23 mg/dL Final  . Creatinine, Ser 09/24/2020 1.00  0.44 - 1.00 mg/dL Final  . Calcium 09/24/2020 9.3  8.9 - 10.3 mg/dL Final  . GFR, Estimated 09/24/2020 60* >60 mL/min Final  . Anion gap 09/24/2020 9  5 - 15 Final    Performed at [facility], [place] [address] [place], [place] [address]  . WBC 09/24/2020 5.6  4.0 - 10.5 K/uL Final  . RBC 09/24/2020 4.91  3.87 - 5.11 MIL/uL Final  . Hemoglobin 09/24/2020 13.4  12.0 - 15.0 g/dL Final  . HCT 09/24/2020 40.7  36 - 46 % Final  . MCV 09/24/2020 82.9  80.0 - 100.0 fL Final  . MCH 09/24/2020 27.3  26.0 - 34.0 pg Final  .  MCHC 09/24/2020 32.9  30.0 - 36.0 g/dL Final  . RDW 09/24/2020 12.4  11.5 - 15.5 % Final  . Platelets 09/24/2020 285  150 - 400 K/uL Final  . nRBC 09/24/2020 0.0  0.0 - 0.2 % Final   Performed at [facility], [place] [address], [place], [place] [address]  . Prothrombin Time 09/24/2020 12.9  11.4 - 15.2 seconds Final  . INR 09/24/2020 1.0  0.8 - 1.2 Final   Comment: (NOTE) INR goal varies based on device and disease states. Performed at [facility], [place] [address] [place], [place] [address]   . Color, Urine 09/24/2020 YELLOW  YELLOW Final  . APPearance 09/24/2020 CLEAR  CLEAR Final  . Specific Gravity, Urine 09/24/2020 1.018  1.005 - 1.030 Final  . pH 09/24/2020 6.0  5.0 - 8.0 Final  . Glucose, UA 09/24/2020 NEGATIVE  NEGATIVE mg/dL Final  . Hgb urine dipstick 09/24/2020 NEGATIVE  NEGATIVE Final  . Bilirubin Urine 09/24/2020 NEGATIVE  NEGATIVE Final  . Ketones, ur 09/24/2020 NEGATIVE  NEGATIVE mg/dL Final  . Protein, ur 09/24/2020 NEGATIVE  NEGATIVE mg/dL Final  . Nitrite 09/24/2020 NEGATIVE  NEGATIVE Final  . Chalmers Guest 09/24/2020 NEGATIVE  NEGATIVE Final   Performed at [facility], [place] [address], [place], [place] [address]     X-Rays:DG Knee 2 Views Left  Result Date: 10/02/2020 CLINICAL DATA:  Status post left knee replacement. EXAM: LEFT KNEE - 1-2 VIEW COMPARISON:  February 20, 2015 FINDINGS: A left knee replacement is seen without evidence of surrounding lucency to suggest the presence of hardware loosening. There is no evidence of acute fracture or  dislocation. There is a large joint effusion with a large amount of associated air within the joint space. Mild anterior soft tissue swelling is seen. IMPRESSION: 1. Left knee replacement without evidence of hardware loosening. 2. Large joint effusion with a large amount of associated joint space air. Electronically Signed   By: Virgina Norfolk M.D.   On: 10/02/2020 16:13    EKG: Orders placed or performed in visit on 07/19/20  . EKG 12-Lead     Hospital Course: Nicole Reyes is a 65 y.o. who was admitted to [facility]. They were brought to the operating room on 10/02/2020 and underwent Procedure(s): TOTAL KNEE ARTHROPLASTY.  Patient tolerated the procedure well and was later transferred to the recovery room and then to the orthopaedic floor for postoperative care.  They were given PO and IV analgesics for pain control following their surgery.  They were given 24 hours of postoperative antibiotics of  Anti-infectives (From admission, onward)   Start     Dose/Rate Route Frequency Ordered Stop   10/02/20 1900  ceFAZolin (ANCEF) IVPB 2g/100 mL premix        2 g 200 mL/hr over 30 Minutes Intravenous Every 6 hours 10/02/20 1822 10/03/20 0833   10/02/20 0930  ceFAZolin (ANCEF) IVPB 2g/100 mL premix        2 g 200 mL/hr over 30 Minutes Intravenous On call to O.R. 10/02/20 1497 10/02/20 1230     and started on DVT prophylaxis in the form of Aspirin, TED hose and SCDS.   PT and OT were ordered for total joint protocol.  Discharge planning consulted to help with postop disposition and equipment needs.  Patient had a fair night on the evening of surgery.  They started to get up OOB with therapy on day one. Continued to work with therapy into day two.  By day three, the patient had progressed with therapy and meeting their goals.  Incision was healing well.  Patient was seen in rounds and was ready to go home.   Diet: Regular diet Activity:WBAT Follow-up:in 10-14 days Disposition -  Home Discharged Condition: good    Allergies as of 10/05/2020      Reactions   Peanut-containing Drug Products Anaphylaxis   Also walnuts and almonds    Shellfish Allergy Hives      Medication List    STOP taking these medications   hydrochlorothiazide 25 MG tablet Commonly known as: HYDRODIURIL   hydrOXYzine 10 MG tablet Commonly known as: ATARAX/VISTARIL     TAKE these medications   albuterol 108 (90 Base) MCG/ACT inhaler Commonly known as: ProAir HFA TAKE 2 PUFFS BY MOUTH EVERY 6 HOURS AS NEEDED FOR WHEEZE What changed:   how much to take  how to take this  when to take this  reasons to take this  additional instructions   amLODipine 10 MG tablet Commonly known as: NORVASC Take 1 tablet (10 mg total) by mouth daily.   aspirin EC 81 MG tablet Take 1 tablet (81 mg total) by mouth in the morning and at bedtime. Swallow whole. What changed: when to take this   atorvastatin 10 MG tablet Commonly known as: LIPITOR Take 1 tablet (10 mg total) by mouth daily.   budesonide-formoterol 80-4.5 MCG/ACT inhaler Commonly known as: SYMBICORT Inhale 2 puffs into the lungs 2 (two) times daily.   cetirizine 10 MG tablet Commonly known as: ZYRTEC Take 1 tablet (10 mg total) by mouth daily. What changed:   when to take this  reasons to take this   docusate sodium 100 MG capsule Commonly known as: COLACE Take 1 capsule (100 mg total) by mouth 2 (two) times daily.   EPINEPHrine 0.3 mg/0.3 mL Soaj injection Commonly known as: EPI-PEN Inject 0.3 mLs (0.3 mg total) into the muscle as needed for anaphylaxis.   fluticasone 50 MCG/ACT nasal spray Commonly known as: FLONASE Place 2 sprays into both nostrils daily as needed (allergies.).   lisinopril-hydrochlorothiazide 20-25 MG tablet Commonly known as: ZESTORETIC Take 1 tablet by mouth daily.   methocarbamol 500 MG tablet Commonly known as: ROBAXIN Take 1 tablet (500 mg total) by mouth every 6 (six) hours as  needed for muscle spasms.   ondansetron 4 MG tablet Commonly known as: ZOFRAN Take 1 tablet (4 mg total) by mouth every 6 (six) hours as needed for nausea.   oxyCODONE 5 MG immediate release tablet Commonly known as: Oxy IR/ROXICODONE Take 1-2 tablets (5-10 mg total) by mouth every 4 (four) hours as needed for moderate pain or severe pain (pain score 4-6).   polyethylene glycol 17 g packet Commonly known as: MIRALAX / GLYCOLAX Take 17 g by mouth daily as needed for mild constipation.   sertraline 25 MG tablet Commonly known as: Zoloft Take 1 tablet (25 mg total) by mouth daily.       Follow-up Information    Susa Day, MD Follow up in 2 week(s).   Specialty: Orthopedic Surgery Contact information: [address] [place] [address] [place] [address] [phone]        [facility] [place] Follow up.   Why: agency will provide home health services Contact information: [place] [address] [phone]               Signed: Lacie Draft, PA-C Orthopaedic Surgery 10/11/2020, 2:22 PM

## 2021-01-07 ENCOUNTER — Other Ambulatory Visit: Payer: Self-pay | Admitting: Family Medicine

## 2021-01-07 DIAGNOSIS — F411 Generalized anxiety disorder: Secondary | ICD-10-CM

## 2021-01-10 ENCOUNTER — Other Ambulatory Visit: Payer: Self-pay | Admitting: Family Medicine

## 2021-01-10 DIAGNOSIS — E785 Hyperlipidemia, unspecified: Secondary | ICD-10-CM

## 2021-01-16 ENCOUNTER — Other Ambulatory Visit: Payer: Self-pay | Admitting: Registered Nurse

## 2021-01-16 DIAGNOSIS — I10 Essential (primary) hypertension: Secondary | ICD-10-CM

## 2021-01-30 ENCOUNTER — Other Ambulatory Visit: Payer: Self-pay | Admitting: Family Medicine

## 2021-01-30 DIAGNOSIS — F411 Generalized anxiety disorder: Secondary | ICD-10-CM

## 2021-01-30 NOTE — Telephone Encounter (Signed)
  Notes to clinic:  requesting a 90 day supply   Requested Prescriptions  Pending Prescriptions Disp Refills   sertraline (ZOLOFT) 25 MG tablet [Pharmacy Med Name: SERTRALINE HCL 25 MG TABLET] 90 tablet 1    Sig: TAKE 1 TABLET BY MOUTH EVERY DAY      Psychiatry:  Antidepressants - SSRI Failed - 01/30/2021  9:31 AM      Failed - Valid encounter within last 6 months    Recent Outpatient Visits           6 months ago Preoperative examination   [facility] [place], NP   8 months ago Essential hypertension   [facility], Ranell Patrick, MD   9 months ago Essential hypertension   [facility], Ranell Patrick, MD   9 months ago BP check   [facility] [place], MD   9 months ago Hyperlipidemia, unspecified hyperlipidemia type   [facility], Ranell Patrick, MD

## 2021-01-30 NOTE — Telephone Encounter (Signed)
Patient/Pharmacy is wanting a 90 day supply. Is this ok to do? Patient is requesting a refill of the following medications: Requested Prescriptions   Pending Prescriptions Disp Refills   sertraline (ZOLOFT) 25 MG tablet [Pharmacy Med Name: SERTRALINE HCL 25 MG TABLET] 90 tablet 1    Sig: TAKE 1 TABLET BY MOUTH EVERY DAY    Date of patient request: 01/30/21 Last office visit: 07/19/20 Date of last refill: 2/17//22 Last refill amount: 30 Follow up time period per chart: 03/27/2021

## 2021-02-15 ENCOUNTER — Other Ambulatory Visit: Payer: Self-pay | Admitting: Family Medicine

## 2021-02-15 DIAGNOSIS — I10 Essential (primary) hypertension: Secondary | ICD-10-CM

## 2021-02-15 NOTE — Telephone Encounter (Signed)
Requested medication (s) are due for refill today: yes  Requested medication (s) are on the active medication list: yes  Last refill:  04/18/21  Future visit scheduled: no  Notes to clinic:  needs appt   Requested Prescriptions  Pending Prescriptions Disp Refills   amLODipine ([place]) 10 MG tablet [Pharmacy Med Name: AMLODIPINE BESYLATE 10 MG TAB] 90 tablet 1    Sig: TAKE 1 TABLET BY MOUTH EVERY DAY      Cardiovascular:  Calcium Channel Blockers Failed - 02/15/2021  9:02 AM      Failed - Valid encounter within last 6 months    Recent Outpatient Visits           7 months ago Preoperative examination   [facility], Delfino Lovett, NP   8 months ago Essential hypertension   [facility], Ranell Patrick, MD   9 months ago Essential hypertension   [facility], Ranell Patrick, MD   9 months ago BP check   [facility], Ranell Patrick, MD   10 months ago Hyperlipidemia, unspecified hyperlipidemia type   [facility] [place], MD                Passed - Last BP in normal range    BP Readings from Last 1 Encounters:  10/05/20 134/68

## 2021-02-17 NOTE — Telephone Encounter (Signed)
No further refills without office visit 

## 2021-02-17 NOTE — Telephone Encounter (Signed)
Please schedule appt for med refills for HTN. 30 Day supply has been sent

## 2021-02-17 NOTE — Telephone Encounter (Signed)
Left message on voicemail asking patient to call back to schedule office visit for med refill.

## 2021-03-14 ENCOUNTER — Other Ambulatory Visit: Payer: Self-pay | Admitting: Family Medicine

## 2021-03-14 DIAGNOSIS — I10 Essential (primary) hypertension: Secondary | ICD-10-CM

## 2021-03-22 ENCOUNTER — Other Ambulatory Visit: Payer: Self-pay | Admitting: Family Medicine

## 2021-03-22 DIAGNOSIS — I10 Essential (primary) hypertension: Secondary | ICD-10-CM

## 2021-03-22 NOTE — Telephone Encounter (Signed)
Routing to Dr Carlota Raspberry.

## 2021-03-27 ENCOUNTER — Ambulatory Visit: Payer: 59 | Admitting: Internal Medicine

## 2021-04-15 ENCOUNTER — Ambulatory Visit: Payer: 59 | Admitting: Internal Medicine

## 2021-05-14 ENCOUNTER — Other Ambulatory Visit: Payer: Self-pay | Admitting: Family Medicine

## 2021-05-14 DIAGNOSIS — J45909 Unspecified asthma, uncomplicated: Secondary | ICD-10-CM

## 2021-05-14 NOTE — Telephone Encounter (Signed)
Refilled symbicort, but please schedule follow up visit.

## 2021-05-14 NOTE — Telephone Encounter (Signed)
LOV with pcp 06/03/20 NOV none

## 2021-05-15 ENCOUNTER — Other Ambulatory Visit: Payer: Self-pay | Admitting: Family Medicine

## 2021-05-15 DIAGNOSIS — J45909 Unspecified asthma, uncomplicated: Secondary | ICD-10-CM

## 2021-05-15 NOTE — Telephone Encounter (Signed)
LM making pt aware that she needs to schedule an appt with Carlota Raspberry before anymore refills will be given.

## 2021-05-28 DIAGNOSIS — M5416 Radiculopathy, lumbar region: Secondary | ICD-10-CM | POA: Insufficient documentation

## 2021-06-17 ENCOUNTER — Ambulatory Visit: Payer: 59 | Admitting: Internal Medicine

## 2021-07-07 ENCOUNTER — Other Ambulatory Visit: Payer: Self-pay | Admitting: Family Medicine

## 2021-07-07 DIAGNOSIS — E785 Hyperlipidemia, unspecified: Secondary | ICD-10-CM

## 2021-07-15 ENCOUNTER — Encounter: Payer: Self-pay | Admitting: Internal Medicine

## 2021-07-16 ENCOUNTER — Telehealth: Payer: Self-pay | Admitting: *Deleted

## 2021-07-16 NOTE — Telephone Encounter (Signed)
Message left for patient asking if she can change her pre visit appointment from 8/26 to 8/22  @  2:30 pm , virtual or in-person. Nicole Reyes will put patient on schedule if patient agrees.

## 2021-07-28 ENCOUNTER — Other Ambulatory Visit: Payer: Self-pay

## 2021-07-28 ENCOUNTER — Other Ambulatory Visit: Payer: Self-pay | Admitting: Occupational Medicine

## 2021-07-28 ENCOUNTER — Ambulatory Visit: Payer: Self-pay

## 2021-07-28 DIAGNOSIS — M25521 Pain in right elbow: Secondary | ICD-10-CM

## 2021-08-04 ENCOUNTER — Other Ambulatory Visit: Payer: Self-pay

## 2021-08-04 ENCOUNTER — Ambulatory Visit (AMBULATORY_SURGERY_CENTER): Payer: 59 | Admitting: *Deleted

## 2021-08-04 VITALS — Ht 65.5 in | Wt 220.0 lb

## 2021-08-04 DIAGNOSIS — Z1211 Encounter for screening for malignant neoplasm of colon: Secondary | ICD-10-CM

## 2021-08-04 NOTE — Progress Notes (Signed)
Virtual pre visit completed over telephone. Instructions e-mailed  [email or web address] and mailed,   No egg or soy allergy known to patient  No issues with past sedation with any surgeries or procedures Patient denies ever being told they had issues or difficulty with intubation  No FH of Malignant Hyperthermia No diet pills per patient No home 02 use per patient  No blood thinners per patient   Pt denies issues with constipation   No A fib or A flutter  EMMI video to pt or via [place] guidelines implemented in PV today with Pt and RN    Due to the COVID-19 pandemic we are asking patients to follow certain guidelines.  Pt aware of COVID protocols and [facility] guidelines

## 2021-08-15 DIAGNOSIS — S40021A Contusion of right upper arm, initial encounter: Secondary | ICD-10-CM | POA: Insufficient documentation

## 2021-08-15 DIAGNOSIS — M79601 Pain in right arm: Secondary | ICD-10-CM | POA: Insufficient documentation

## 2021-08-20 ENCOUNTER — Encounter: Payer: Self-pay | Admitting: Internal Medicine

## 2021-08-26 ENCOUNTER — Ambulatory Visit (AMBULATORY_SURGERY_CENTER): Payer: 59 | Admitting: Internal Medicine

## 2021-08-26 ENCOUNTER — Other Ambulatory Visit: Payer: Self-pay | Admitting: Internal Medicine

## 2021-08-26 ENCOUNTER — Encounter: Payer: Self-pay | Admitting: Internal Medicine

## 2021-08-26 ENCOUNTER — Other Ambulatory Visit: Payer: Self-pay

## 2021-08-26 VITALS — BP 131/79 | HR 83 | Temp 98.2°F | Resp 15 | Ht 65.0 in | Wt 220.0 lb

## 2021-08-26 DIAGNOSIS — K6289 Other specified diseases of anus and rectum: Secondary | ICD-10-CM

## 2021-08-26 DIAGNOSIS — K635 Polyp of colon: Secondary | ICD-10-CM

## 2021-08-26 DIAGNOSIS — K623 Rectal prolapse: Secondary | ICD-10-CM

## 2021-08-26 DIAGNOSIS — Z1211 Encounter for screening for malignant neoplasm of colon: Secondary | ICD-10-CM

## 2021-08-26 DIAGNOSIS — D128 Benign neoplasm of rectum: Secondary | ICD-10-CM

## 2021-08-26 MED ORDER — SODIUM CHLORIDE 0.9 % IV SOLN: 500.0000 mL | Freq: Once | INTRAVENOUS | Status: DC

## 2021-08-26 NOTE — Progress Notes (Signed)
Called to room to assist during endoscopic procedure.  Patient ID and intended procedure confirmed with present staff. Received instructions for my participation in the procedure from the performing physician.  

## 2021-08-26 NOTE — Progress Notes (Signed)
A.S. vital signs. 

## 2021-08-26 NOTE — Progress Notes (Signed)
Pt wheezing, albuterol given. Vss MD made aware.

## 2021-08-26 NOTE — Op Note (Signed)
[place] Patient Name: Nicole Reyes Procedure Date: 08/26/2021 3:24 PM MRN: [id number] Endoscopist: Gatha Mayer , MD Age: 65 Referring MD:  Date of Birth: [birth year] Gender: Female Account #: [id number] Procedure:                Colonoscopy Indications:              Screening for colorectal malignant neoplasm, Last                            colonoscopy: 2012 Medicines:                Propofol per Anesthesia, Monitored Anesthesia Care Procedure:                Pre-Anesthesia Assessment:                           - Prior to the procedure, a History and Physical                            was performed, and patient medications and                            allergies were reviewed. The patient's tolerance of                            previous anesthesia was also reviewed. The risks                            and benefits of the procedure and the sedation                            options and risks were discussed with the patient.                            All questions were answered, and informed consent                            was obtained. Prior Anticoagulants: The patient has                            taken no previous anticoagulant or antiplatelet                            agents. ASA Grade Assessment: II - A patient with                            mild systemic disease. After reviewing the risks                            and benefits, the patient was deemed in                            satisfactory condition to undergo the procedure.  After obtaining informed consent, the colonoscope                            was passed under direct vision. Throughout the                            procedure, the patient's blood pressure, pulse, and                            oxygen saturations were monitored continuously. The                            Colonoscope was introduced through the anus and                            advanced to  the the cecum, identified by                            appendiceal orifice and ileocecal valve. The                            colonoscopy was performed without difficulty. The                            patient tolerated the procedure well. Scope In: 3:35:33 PM Scope Out: 3:47:33 PM Scope Withdrawal Time: 0 hours 10 minutes 15 seconds  Total Procedure Duration: 0 hours 12 minutes 0 seconds  Findings:                 The perianal and digital rectal examinations were                            normal.                           A diminutive polyp was found in the proximal                            rectum. The polyp was semi-pedunculated. The polyp                            was removed with a cold snare. Resection and                            retrieval were complete. Verification of patient                            identification for the specimen was done. Estimated                            blood loss was minimal.                           The exam was otherwise without abnormality on  direct and retroflexion views. Complications:            No immediate complications. Estimated Blood Loss:     Estimated blood loss was minimal. Impression:               - One diminutive polyp in the proximal rectum,                            removed with a cold snare. Resected and retrieved.                           - The examination was otherwise normal on direct                            and retroflexion views. Recommendation:           - Patient has a contact number available for                            emergencies. The signs and symptoms of potential                            delayed complications were discussed with the                            patient. Return to normal activities tomorrow.                            Written discharge instructions were provided to the                            patient.                           - Resume previous diet.                            - Continue present medications.                           - Repeat colonoscopy is recommended. The                            colonoscopy date will be determined after pathology                            results from today's exam become available for                            review. Gatha Mayer, MD 08/26/2021 3:57:57 PM This report has been signed electronically.

## 2021-08-26 NOTE — Progress Notes (Signed)
Report given to PACU, vss 

## 2021-08-26 NOTE — Patient Instructions (Addendum)
I found and removed one tiny polyp that looks benign. I will let you know pathology results and when to have another routine colonoscopy by mail and/or My Chart.  I appreciate the opportunity to care for you. Gatha Mayer, MD, FACG    YOU HAD AN ENDOSCOPIC PROCEDURE TODAY AT [facility]:   Refer to the procedure report that was given to you for any specific questions about what was found during the examination.  If the procedure report does not answer your questions, please call your gastroenterologist to clarify.  If you requested that your care partner not be given the details of your procedure findings, then the procedure report has been included in a sealed envelope for you to review at your convenience later.  YOU SHOULD EXPECT: Some feelings of bloating in the abdomen. Passage of more gas than usual.  Walking can help get rid of the air that was put into your GI tract during the procedure and reduce the bloating. If you had a lower endoscopy (such as a colonoscopy or flexible sigmoidoscopy) you may notice spotting of blood in your stool or on the toilet paper. If you underwent a bowel prep for your procedure, you may not have a normal bowel movement for a few days.  Please Note:  You might notice some irritation and congestion in your nose or some drainage.  This is from the oxygen used during your procedure.  There is no need for concern and it should clear up in a day or so.  SYMPTOMS TO REPORT IMMEDIATELY:  Following lower endoscopy (colonoscopy or flexible sigmoidoscopy):  Excessive amounts of blood in the stool  Significant tenderness or worsening of abdominal pains  Swelling of the abdomen that is new, acute  Fever of 100F or higher    For urgent or emergent issues, a gastroenterologist can be reached at any hour by calling [phone]. Do not use MyChart messaging for urgent concerns.    DIET:  We do recommend a small meal at first, but then you may  proceed to your regular diet.  Drink plenty of fluids but you should avoid alcoholic beverages for 24 hours.  ACTIVITY:  You should plan to take it easy for the rest of today and you should NOT DRIVE or use heavy machinery until tomorrow (because of the sedation medicines used during the test).    FOLLOW UP: Our staff will call the number listed on your records 48-72 hours following your procedure to check on you and address any questions or concerns that you may have regarding the information given to you following your procedure. If we do not reach you, we will leave a message.  We will attempt to reach you two times.  During this call, we will ask if you have developed any symptoms of COVID 19. If you develop any symptoms (ie: fever, flu-like symptoms, shortness of breath, cough etc.) before then, please call [phone].  If you test positive for Covid 19 in the 2 weeks post procedure, please call and report this information to [place].    If any biopsies were taken you will be contacted by phone or by letter within the next 1-3 weeks.  Please call us at [phone] if you have not heard about the biopsies in 3 weeks.    SIGNATURES/CONFIDENTIALITY: You and/or your care partner have signed paperwork which will be entered into your electronic medical record.  These signatures attest to the fact that that the information  above on your After Visit Summary has been reviewed and is understood.  Full responsibility of the confidentiality of this discharge information lies with you and/or your care-partner.    Resume medications.Information given on polyps.

## 2021-08-26 NOTE — Progress Notes (Signed)
[facility] History and Physical   Primary Care Physician:  Wendie Agreste, MD   Reason for Procedure:   Colon cancerscreening  Plan:    colonoscopy     HPI: Nicole Reyes is a 65 y.o. 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 Authorizing Provider  amLODipine (NORVASC) 10 MG tablet TAKE 1 TABLET BY MOUTH EVERY DAY 03/22/21  Yes Wendie Agreste, MD  aspirin EC 81 MG tablet Take 1 tablet (81 mg total) by mouth in the morning and at bedtime. Swallow whole. 10/04/20  Yes Lacie Draft M, PA-C  cholecalciferol (VITAMIN D3) 25 MCG (1000 UNIT) tablet Take 1,000 Units by mouth daily.   Yes [provider]  cyanocobalamin 1000 MCG tablet Take 1,000 mcg by mouth daily.   Yes [provider]  lisinopril-hydrochlorothiazide (ZESTORETIC) 20-25 MG tablet TAKE 1 TABLET BY MOUTH EVERY DAY 03/14/21  Yes Wendie Agreste, MD  SYMBICORT 80-4.5 MCG/ACT inhaler TAKE 2 PUFFS BY MOUTH TWICE A DAY 05/16/21  Yes Wendie Agreste, MD  ADVAIR DISKUS 500-50 MCG/ACT AEPB Inhale 1 puff into the lungs 2 (two) times daily. 04/29/21   [provider]  albuterol (PROAIR  HFA) 108 (90 Base) MCG/ACT inhaler TAKE 2 PUFFS BY MOUTH EVERY 6 HOURS AS NEEDED FOR WHEEZE Patient taking differently: Inhale 2 puffs into the lungs every 6 (six) hours as needed (wheeze). 04/18/20   Wendie Agreste, MD  atorvastatin (LIPITOR) 10 MG tablet TAKE 1 TABLET BY MOUTH EVERY DAY 07/07/21   Wendie Agreste, MD  cetirizine (ZYRTEC) 10 MG tablet Take 1 tablet (10 mg total) by mouth daily. Patient taking differently: Take 10 mg by mouth daily as needed for allergies. 04/18/20   Wendie Agreste, MD  docusate sodium (COLACE) 100 MG capsule Take 1 capsule (100 mg total) by mouth 2 (two) times daily. 10/04/20   [name], PA-C  EPINEPHrine 0.3 mg/0.3 mL IJ SOAJ injection Inject 0.3 mLs (0.3 mg total) into the muscle as needed for anaphylaxis. 03/28/20   Wendie Agreste, MD  fluticasone (FLONASE) 50 MCG/ACT nasal spray Place 2 sprays into both nostrils daily as needed (allergies.).  05/21/20   [provider]  methocarbamol (ROBAXIN) 500 MG tablet Take 1 tablet (500 mg total) by mouth every 6 (six) hours as needed for muscle spasms. Patient not taking: No sig reported 10/04/20   [name], PA-C  ondansetron (ZOFRAN) 4 MG tablet Take 1 tablet (4 mg total) by mouth every 6 (six) hours as needed for nausea. Patient not taking: No sig reported  10/04/20   [name], PA-C  oxyCODONE (OXY IR/ROXICODONE) 5 MG immediate release tablet Take 1-2 tablets (5-10 mg total) by mouth every 4 (four) hours as needed for moderate pain or severe pain (pain score 4-6). Patient not taking: No sig reported 10/04/20   [name], PA-C  Polyethylene Glycol 3350 (MIRALAX PO) Take 238 g by mouth.    [provider]  sertraline (ZOLOFT) 25 MG tablet TAKE 1 TABLET BY MOUTH EVERY DAY Patient not taking: No sig reported 01/30/21   Wendie Agreste, MD    Current Outpatient Medications  Medication Sig Dispense Refill   amLODipine (NORVASC) 10 MG tablet TAKE 1 TABLET BY MOUTH  EVERY DAY 30 tablet 0   aspirin EC 81 MG tablet Take 1 tablet (81 mg total) by mouth in the morning and at bedtime. Swallow whole. 30 tablet 11   cholecalciferol (VITAMIN D3) 25 MCG (1000 UNIT) tablet Take 1,000 Units by mouth daily.     cyanocobalamin 1000 MCG tablet Take 1,000 mcg by mouth daily.     lisinopril-hydrochlorothiazide (ZESTORETIC) 20-25 MG tablet TAKE 1 TABLET BY MOUTH EVERY DAY 90 tablet 0   SYMBICORT 80-4.5 MCG/ACT inhaler TAKE 2 PUFFS BY MOUTH TWICE A DAY 30.6 each 0   ADVAIR DISKUS 500-50 MCG/ACT AEPB Inhale 1 puff into the lungs 2 (two) times daily.     albuterol (PROAIR HFA) 108 (90 Base) MCG/ACT inhaler TAKE 2 PUFFS BY MOUTH EVERY 6 HOURS AS NEEDED FOR WHEEZE (Patient taking differently: Inhale 2 puffs into the lungs every 6 (six) hours as needed (wheeze).) 18 g 1   atorvastatin (LIPITOR) 10 MG tablet TAKE 1 TABLET BY MOUTH EVERY DAY 90 tablet 1   cetirizine (ZYRTEC) 10 MG tablet Take 1 tablet (10 mg total) by mouth daily. (Patient taking differently: Take 10 mg by mouth daily as needed for allergies.) 90 tablet 1   docusate sodium (COLACE) 100 MG capsule Take 1 capsule (100 mg total) by mouth 2 (two) times daily. 40 capsule 0   EPINEPHrine 0.3 mg/0.3 mL IJ SOAJ injection Inject 0.3 mLs (0.3 mg total) into the muscle as needed for anaphylaxis. 1 each 0   fluticasone (FLONASE) 50 MCG/ACT nasal spray Place 2 sprays into both nostrils daily as needed (allergies.).      methocarbamol (ROBAXIN) 500 MG tablet Take 1 tablet (500 mg total) by mouth every 6 (six) hours as needed for muscle spasms. (Patient not taking: No sig reported) 40 tablet 1   ondansetron (ZOFRAN) 4 MG tablet Take 1 tablet (4 mg total) by mouth every 6 (six) hours as needed for nausea. (Patient not taking: No sig reported) 40 tablet 1   oxyCODONE (OXY IR/ROXICODONE) 5 MG immediate release tablet Take 1-2 tablets (5-10 mg total) by mouth every 4 (four) hours as needed for moderate pain or severe pain (pain score 4-6).  (Patient not taking: No sig reported) 40 tablet 0   Polyethylene Glycol 3350 (MIRALAX PO) Take 238 g by mouth.     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   Shellfish  allergy Hives 10/02/2017   Cephalexin Rash 08/04/2021    Family History  Problem Relation Age of Onset   Hypertension Mother    Asthma Mother    Alcohol abuse Father    Heart disease Father 28       AMI age 66   Stroke Father 64       CVA   Hypertension Sister    Hypertension Sister    Hypertension Sister    Hypertension Sister    Hypertension Brother    Hyperlipidemia Brother    Hypertension Brother    Colon cancer Neg Hx    Colitis Neg Hx    Esophageal cancer Neg Hx    Stomach cancer Neg Hx    Rectal cancer Neg Hx     Social History   Socioeconomic History   Marital status: Single    Spouse name: n/a   Number of children: 0   Years of education: Not on file   Highest education level: Not on file  Occupational History   Occupation: employed    Fish farm manager: [employer]  Tobacco Use   Smoking status: Never   Smokeless tobacco: Never  Vaping Use   Vaping Use: Never used  Substance and Sexual Activity   Alcohol use: No    Alcohol/week: 0.0 standard drinks   Drug use: No   Sexual activity: Yes    Birth control/protection: Post-menopausal  Other Topics Concern   Not on file  Social History Narrative   Marital status:  Single; not dating      Children: none      Lives: alone      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      Tobacco: none      Alcohol: none      Exercise:  Four days per  week.   Social Determinants of Health   Financial Resource Strain: Not on file  Food Insecurity: Not on file  Transportation Needs: Not on file  Physical Activity: Not on file  Stress: Not on file  Social Connections: Not on file  Intimate Partner Violence: Not on file    Review of Systems:  All other review of systems negative except as mentioned in the HPI.  Physical Exam: Vital signs BP (!) 174/96   Pulse 83   Temp 98.2 F (36.8 C)   Resp 12   Ht '5\' 5"'$  (1.651 m)   Wt 220 lb (99.8 kg)   SpO2 99%   BMI 36.61 kg/m   General:   Alert,  Well-developed, well-nourished, pleasant and cooperative in NAD Lungs:  Clear throughout to auscultation.   Heart:  Regular rate and rhythm; no murmurs, clicks, rubs,  or gallops. Abdomen:  Soft, nontender and nondistended. Normal bowel sounds.   Neuro/Psych:  Alert and cooperative. Normal mood and affect. A and O x 3   '@Enrique Weiss'$  Simonne Maffucci, MD, [facility] [phone] (pager) 08/26/2021 3:28 PM@

## 2021-08-26 NOTE — Progress Notes (Signed)
Pt's states no medical or surgical changes since previsit or office visit. 

## 2021-08-28 ENCOUNTER — Telehealth: Payer: Self-pay | Admitting: *Deleted

## 2021-08-28 NOTE — Telephone Encounter (Signed)
Attempted to call patient for their post-procedure follow-up call. No answer. Left voicemail.   

## 2021-08-28 NOTE — Telephone Encounter (Signed)
Patient returned call. States she is doing fine and went back to work

## 2021-08-28 NOTE — Telephone Encounter (Signed)
Follow up call made. 

## 2021-09-05 ENCOUNTER — Encounter: Payer: Self-pay | Admitting: Internal Medicine

## 2021-10-16 LAB — HM DIABETES EYE EXAM

## 2021-10-22 DIAGNOSIS — S46119A Strain of muscle, fascia and tendon of long head of biceps, unspecified arm, initial encounter: Secondary | ICD-10-CM | POA: Insufficient documentation

## 2021-10-27 ENCOUNTER — Encounter: Payer: Self-pay | Admitting: Family Medicine

## 2022-01-08 ENCOUNTER — Other Ambulatory Visit: Payer: Self-pay | Admitting: Family Medicine

## 2022-01-08 DIAGNOSIS — E785 Hyperlipidemia, unspecified: Secondary | ICD-10-CM

## 2022-04-24 HISTORY — PX: ROTATOR CUFF REPAIR: SHX139

## 2022-07-16 ENCOUNTER — Other Ambulatory Visit: Payer: Self-pay | Admitting: Obstetrics

## 2022-07-16 DIAGNOSIS — R928 Other abnormal and inconclusive findings on diagnostic imaging of breast: Secondary | ICD-10-CM

## 2022-07-23 ENCOUNTER — Encounter: Payer: Self-pay | Admitting: Family Medicine

## 2022-07-23 ENCOUNTER — Ambulatory Visit (INDEPENDENT_AMBULATORY_CARE_PROVIDER_SITE_OTHER): Payer: 59 | Admitting: Family Medicine

## 2022-07-23 VITALS — BP 160/70 | HR 91 | Temp 97.8°F | Resp 20 | Ht 65.0 in | Wt 238.8 lb

## 2022-07-23 DIAGNOSIS — R739 Hyperglycemia, unspecified: Secondary | ICD-10-CM

## 2022-07-23 DIAGNOSIS — Z91148 Patient's other noncompliance with medication regimen for other reason: Secondary | ICD-10-CM

## 2022-07-23 DIAGNOSIS — I10 Essential (primary) hypertension: Secondary | ICD-10-CM | POA: Diagnosis not present

## 2022-07-23 DIAGNOSIS — F411 Generalized anxiety disorder: Secondary | ICD-10-CM

## 2022-07-23 DIAGNOSIS — J45909 Unspecified asthma, uncomplicated: Secondary | ICD-10-CM

## 2022-07-23 DIAGNOSIS — E785 Hyperlipidemia, unspecified: Secondary | ICD-10-CM

## 2022-07-23 LAB — COMPREHENSIVE METABOLIC PANEL
ALT: 27 U/L (ref 0–35)
AST: 30 U/L (ref 0–37)
Albumin: 5 g/dL (ref 3.5–5.2)
Alkaline Phosphatase: 130 U/L — ABNORMAL HIGH (ref 39–117)
BUN: 11 mg/dL (ref 6–23)
CO2: 27 mEq/L (ref 19–32)
Calcium: 9.9 mg/dL (ref 8.4–10.5)
Chloride: 105 mEq/L (ref 96–112)
Creatinine, Ser: 0.89 mg/dL (ref 0.40–1.20)
GFR: 67.91 mL/min (ref >60.00)
Glucose, Bld: 84 mg/dL (ref 70–99)
Potassium: 4.1 mEq/L (ref 3.5–5.1)
Sodium: 141 mEq/L (ref 135–145)
Total Bilirubin: 0.8 mg/dL (ref 0.2–1.2)
Total Protein: 8.4 g/dL — ABNORMAL HIGH (ref 6.0–8.3)

## 2022-07-23 LAB — HEMOGLOBIN A1C: Hgb A1c MFr Bld: 5.3 % (ref 4.6–6.5)

## 2022-07-23 LAB — LIPID PANEL
Cholesterol: 272 mg/dL — ABNORMAL HIGH (ref 0–200)
HDL: 49.8 mg/dL (ref >39.00)
LDL Cholesterol: 201 mg/dL — ABNORMAL HIGH (ref 0–99)
NonHDL: 222.47
Total CHOL/HDL Ratio: 5
Triglycerides: 107 mg/dL (ref 0.0–149.0)
VLDL: 21.4 mg/dL (ref 0.0–40.0)

## 2022-07-23 MED ORDER — LISINOPRIL-HYDROCHLOROTHIAZIDE 20-25 MG PO TABS: 1.0000 | ORAL_TABLET | Freq: Every day | ORAL | 1 refills | Status: DC

## 2022-07-23 MED ORDER — ATORVASTATIN CALCIUM 10 MG PO TABS: 10.0000 mg | ORAL_TABLET | Freq: Every day | ORAL | 1 refills | Status: DC

## 2022-07-23 MED ORDER — ALBUTEROL SULFATE HFA 108 (90 BASE) MCG/ACT IN AERS: INHALATION_SPRAY | RESPIRATORY_TRACT | 1 refills | Status: DC

## 2022-07-23 MED ORDER — FLUTICASONE-SALMETEROL 250-50 MCG/ACT IN AEPB: 1.0000 | INHALATION_SPRAY | Freq: Two times a day (BID) | RESPIRATORY_TRACT | 5 refills | Status: DC

## 2022-07-23 MED ORDER — FLUTICASONE PROPIONATE 50 MCG/ACT NA SUSP: 2.0000 | Freq: Every day | NASAL | 5 refills | Status: DC | PRN

## 2022-07-23 MED ORDER — AMLODIPINE BESYLATE 10 MG PO TABS: 10.0000 mg | ORAL_TABLET | Freq: Every day | ORAL | 1 refills | Status: DC

## 2022-07-23 MED ORDER — SERTRALINE HCL 25 MG PO TABS: 25.0000 mg | ORAL_TABLET | Freq: Every day | ORAL | 1 refills | Status: DC

## 2022-07-23 NOTE — Progress Notes (Signed)
Subjective:  Patient ID: Nicole Reyes, female    DOB: 28-Jun-1956  Age: 66 y.o. MRN: [id number]  CC:  Chief Complaint  Patient presents with   Hypertension    Med refill, pt also wants to know if there is a less expensive option for advair Also pt has been off her blood pressure meds for 2 weeks    HPI Nicole Reyes presents for follow-up.  Has not been seen by me since June 2021, my colleague in August 2021 for preop exam. Denies barriers to care, other than initial difficulty finding our office.  Hypertension: Overdue for follow-up.  Blood pressure elevated at 166/92 in August 2021.  Meds adjusted at that time.Had nurse at work checking bp for awhile.  Off both BP meds past 2 weeks.  Previously treated with amlodipine 10 mg daily, lisinopril HCTZ 20/25 mg  Up and down readings recently - including on meds.  Home readings: none Slight HA without eating today  no cp, dyspnea, weakness, or speech changes.  BP Readings from Last 3 Encounters:  07/23/22 (!) 160/70  08/26/21 131/79  10/05/20 134/68   Lab Results  Component Value Date   CREATININE 0.71 10/03/2020   Hyperlipidemia: Prior mild LFT elevation, last checked in 2021.  Atorvastatin 10 mg daily at that time.  Some difficulty with medication adherence previously. Ran out last year. No side effects on meds.   Lab Results  Component Value Date   CHOL 173 04/18/2020   HDL 61 04/18/2020   LDLCALC 99 04/18/2020   TRIG 65 04/18/2020   CHOLHDL 2.8 04/18/2020   Lab Results  Component Value Date   ALT 34 (H) 07/19/2020   AST 43 (H) 07/19/2020   GGT 45 07/20/2019   ALKPHOS 142 (H) 07/19/2020   BILITOT 0.6 07/19/2020   Asthma Persistent, treated with Advair, or Symbicort in the past.  Ran out symbicort few weeks ago. Working ok, but expensive.  Albuterol as needed - no recent use. Not needed when taking symbicort unless during allergy flare in spring.   History of allergies and treated with Zyrtec as needed -  not needed recently. Needs flonase rf, but not needed currently.   Generalized anxiety disorder Treated with Zoloft, hydroxyzine few years ago.  Improved symptoms at May 2021 visit.  Continued hydroxyzine 10 mg as needed with Zoloft 25 mg daily. Ran out sertraline last year. Working well when taking med but still some depression times worse after shoulder surgery and trouble using R arm.   Hyperglycemia: Glucose 146 in 10/21.  Wt Readings from Last 3 Encounters:  07/23/22 238 lb 12.8 oz (108.3 kg)  08/26/21 220 lb (99.8 kg)  08/04/21 220 lb (99.8 kg)   Lab Results  Component Value Date   HGBA1C 5.4 07/19/2020       07/23/2022    1:17 PM 07/19/2020    2:21 PM 06/03/2020   11:29 AM 04/18/2020   10:01 AM 03/28/2020    3:19 PM  Depression screen PHQ 2/9  Decreased Interest 0 0 0 0 0  Down, Depressed, Hopeless 0 0 0 0 3  PHQ - 2 Score 0 0 0 0 3  Altered sleeping '2   3 3  '$ Tired, decreased energy '2   1 3  '$ Change in appetite 0   0 2  Feeling bad or failure about yourself  0   0 0  Trouble concentrating 0   0 2  Moving slowly or fidgety/restless 0   0 0  Suicidal  thoughts 0   0 0  PHQ-9 Score '4   4 13    '$ 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   Acute otitis media 01/08/2019   Sore throat 01/08/2019   Osteoarthritis of right knee 08/31/2018   Right knee DJD 08/31/2018   Allergic reaction 05/23/2018   Allergic urticaria 05/23/2018   Perennial and seasonal allergic rhinitis 05/23/2018   Allergic conjunctivitis 05/23/2018   Food allergy 05/23/2018   Carpal tunnel syndrome of left wrist 01/25/2018   Lattice degeneration 11/20/2017   Dry eye syndrome 11/20/2017   Cataracts, bilateral 11/20/2017   Chronic right SI joint pain 01/28/2016   Nut allergy 01/28/2016   Obesity 01/25/2015   Pure hypercholesterolemia 02/10/2014   Moderate persistent asthma 12/19/2012   Hypertensive disorder 12/19/2012   Past Medical History:  Diagnosis  Date   Allergy    Anxiety    Arthritis    Asthma    Glaucoma    Hypertension    Past Surgical History:  Procedure Laterality Date   COLONOSCOPY  06/29/2011   Normal   COLONOSCOPY&ENDOSCOPY  08/15/1979   PT UNSURE PLACE&MD WHO DID EXAMS=NORMAL   DILATION AND CURETTAGE OF UTERUS     LAPAROSCOPY     REMOVED SCARE TISSUE FROM OVARIES   ROTATOR CUFF REPAIR  04/24/2022   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 Authorizing Provider  ADVAIR DISKUS 500-50 MCG/ACT AEPB Inhale 1 puff into the lungs 2 (two) times daily. 04/29/21  Yes [provider]  albuterol (PROAIR HFA) 108 (90 Base) MCG/ACT inhaler TAKE 2 PUFFS BY MOUTH EVERY 6 HOURS AS NEEDED FOR WHEEZE Patient taking differently: Inhale 2 puffs into the lungs every 6 (six) hours as needed (wheeze). 04/18/20  Yes Wendie Agreste, MD  amLODipine (NORVASC) 10 MG tablet TAKE 1 TABLET BY MOUTH EVERY DAY 03/22/21  Yes Wendie Agreste, MD  aspirin EC 81 MG tablet Take 1 tablet (81 mg total) by mouth in the morning and at bedtime. Swallow whole. 10/04/20  Yes Lacie Draft M, PA-C  atorvastatin (LIPITOR) 10 MG tablet TAKE 1 TABLET BY MOUTH EVERY DAY 07/07/21  Yes Wendie Agreste, MD  cholecalciferol (VITAMIN D3) 25 MCG (1000 UNIT) tablet Take 1,000 Units by mouth daily.   Yes [provider]  EPINEPHrine 0.3 mg/0.3 mL IJ SOAJ injection Inject 0.3 mLs (0.3 mg total) into the muscle as  needed for anaphylaxis. 03/28/20  Yes Wendie Agreste, MD  fluticasone [facility]) 50 MCG/ACT nasal spray Place 2 sprays into both nostrils daily as needed (allergies.).  05/21/20  Yes [provider]  lisinopril-hydrochlorothiazide (ZESTORETIC) 20-25 MG tablet TAKE 1 TABLET BY MOUTH EVERY DAY 03/14/21  Yes Wendie Agreste, MD  sertraline (ZOLOFT) 25 MG tablet TAKE 1 TABLET BY MOUTH EVERY DAY 01/30/21  Yes Wendie Agreste, MD  SYMBICORT 80-4.5 MCG/ACT inhaler TAKE 2 PUFFS BY MOUTH  TWICE A DAY 05/16/21  Yes Wendie Agreste, MD  cetirizine (ZYRTEC) 10 MG tablet Take 1 tablet (10 mg total) by mouth daily. Patient not taking: Reported on 07/23/2022 04/18/20   Wendie Agreste, MD  cyanocobalamin 1000 MCG tablet Take 1,000 mcg by mouth daily. Patient not taking: Reported on 07/23/2022    [provider]  docusate sodium (COLACE) 100 MG capsule Take 1 capsule (100 mg total) by mouth 2 (two) times daily. Patient not taking: Reported on 07/23/2022 10/04/20   [name], PA-C  methocarbamol (ROBAXIN) 500 MG tablet Take 1 tablet (500 mg total) by mouth every 6 (six) hours as needed for muscle spasms. Patient not taking: Reported on 08/04/2021 10/04/20   [name], PA-C  ondansetron (ZOFRAN) 4 MG tablet Take 1 tablet (4 mg total) by mouth every 6 (six) hours as needed for nausea. Patient not taking: Reported on 08/04/2021 10/04/20   [name], PA-C  oxyCODONE (OXY IR/ROXICODONE) 5 MG immediate release tablet Take 1-2 tablets (5-10 mg total) by mouth every 4 (four) hours as needed for moderate pain or severe pain (pain score 4-6). Patient not taking: Reported on 08/04/2021 10/04/20   [name], PA-C  Polyethylene Glycol 3350 (MIRALAX PO) Take 238 g by mouth. Patient not taking: Reported on 07/23/2022    [provider]   Social History   Socioeconomic History   Marital status: Single    Spouse name: n/a   Number of children: 0   Years of education: Not on  file   Highest education level: Not on file  Occupational History   Occupation: employed    Employer: [employer]  Tobacco Use   Smoking status: Never   Smokeless tobacco: Never  Vaping Use   Vaping Use: Never used  Substance and Sexual Activity   Alcohol use: No    Alcohol/week: 0.0 standard drinks of alcohol   Drug use: No   Sexual activity: Yes    Birth control/protection: Post-menopausal  Other Topics Concern   Not on file  Social History Narrative   Marital status:  Single; not dating      Children: none      Lives: alone      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      Tobacco: none      Alcohol: none      Exercise:  Four days per week.   Social Determinants of Health   Financial Resource Strain: Not on file  Food Insecurity: Not on file  Transportation Needs: Not on file  Physical Activity: Not on file  Stress: Not on file  Social Connections: Not on file  Intimate Partner Violence: Not on file    Review of Systems  Constitutional:  Negative for fatigue and unexpected weight change.  Respiratory:  Negative for chest tightness and shortness of breath.   Cardiovascular:  Negative for chest pain, palpitations and leg swelling.  Gastrointestinal:  Negative for abdominal pain and blood in stool.  Neurological:  Negative for dizziness, syncope, light-headedness and headaches.     Objective:   Vitals:   07/23/22 1319 07/23/22 1333  BP: (!) 160/90 (!) 160/70  Pulse: 91   Resp: 20   Temp: 97.8 F (36.6 C)   SpO2: 93%   Weight: 238 lb 12.8 oz (108.3 kg)   Height: '5\' 5"'$  (1.651 m)      Physical Exam Vitals reviewed.  Constitutional:      Appearance: Normal appearance. She  is well-developed.  HENT:     Head: Normocephalic and atraumatic.  Eyes:     Conjunctiva/sclera: Conjunctivae normal.     Pupils: Pupils are equal, round, and reactive to light.  Neck:     Vascular: No carotid bruit.  Cardiovascular:     Rate and Rhythm: Normal rate and  regular rhythm.     Heart sounds: Normal heart sounds.  Pulmonary:     Effort: Pulmonary effort is normal.     Breath sounds: Normal breath sounds.  Abdominal:     Palpations: Abdomen is soft. There is no pulsatile mass.     Tenderness: There is no abdominal tenderness.  Musculoskeletal:     Right lower leg: No edema.     Left lower leg: No edema.  Skin:    General: Skin is warm and dry.  Neurological:     Mental Status: She is alert and oriented to person, place, and time.  Psychiatric:        Mood and Affect: Mood normal.        Behavior: Behavior normal.      38 minutes spent during visit, including chart review, prior note and lab review from 2021 discussion of various medication options and possible adjustments.  Counseling and assimilation of information, exam, discussion of plan, and chart completion.    Assessment & Plan:  Nicole Reyes is a 66 y.o. female .  Extrinsic asthma without complication, unspecified asthma severity, unspecified whether persistent -With history of med nonadherence, only recently out of inhaler but Symbicort appears to be cost prohibitive.  Can try restarting Advair as looks to be better covered for her insurance.  Continue albuterol as needed for breakthrough symptoms for now, recheck 1 month for physical.  Essential hypertension  -Decreased control off meds, restart prior regimen, check labs.  1 month follow-up with home monitoring, RTC precautions in the interim.  Hyperlipidemia, unspecified hyperlipidemia type  -Tolerating statin previously, refilled, check baseline labs, may need to repeat once on medications for 6 weeks.  Generalized anxiety disorder  -Decreased control off meds.  Option of higher dose sertraline with restart but decided to just restart at prior dose of 25 mg and recheck for adjustments next visit.   Meds ordered this encounter  Medications   albuterol (PROAIR HFA) 108 (90 Base) MCG/ACT inhaler    Sig: TAKE 2  PUFFS BY MOUTH EVERY 6 HOURS AS NEEDED FOR WHEEZE    Dispense:  18 g    Refill:  1   amLODipine (NORVASC) 10 MG tablet    Sig: Take 1 tablet (10 mg total) by mouth daily.    Dispense:  90 tablet    Refill:  1   atorvastatin (LIPITOR) 10 MG tablet    Sig: Take 1 tablet (10 mg total) by mouth daily.    Dispense:  90 tablet    Refill:  1   fluticasone (FLONASE) 50 MCG/ACT nasal spray    Sig: Place 2 sprays into both nostrils daily as needed (allergies.).    Dispense:  16 g    Refill:  5   lisinopril-hydrochlorothiazide (ZESTORETIC) 20-25 MG tablet    Sig: Take 1 tablet by mouth daily.    Dispense:  90 tablet    Refill:  1   sertraline (ZOLOFT) 25 MG tablet    Sig: Take 1 tablet (25 mg total) by mouth daily.    Dispense:  90 tablet    Refill:  1   fluticasone-salmeterol (ADVAIR) 250-50 MCG/ACT AEPB  Sig: Inhale 1 puff into the lungs in the morning and at bedtime.    Dispense:  60 each    Refill:  5   Patient Instructions  Thanks for coming in today.  Restart same medications except for the inhaler we can try Advair 250/50 as you may not need the high-dose and this may be less costly.  Let me know if that inhaler is still costly and we can look at alternatives.  Albuterol if needed for breakthrough asthma symptoms.  I suspect mood symptoms will improve on the sertraline 25 mg, but we do have some room to increase that medicine if needed.  Keep me posted.  Follow-up in the next 4 to 6 weeks for physical, sooner if any new or worsening symptoms.  Take care!    Signed,   Merri Ray, MD [facility] 07/23/22 1:55 PM

## 2022-07-23 NOTE — Addendum Note (Signed)
Addended by: Merri Ray R on: 07/23/2022 02:34 PM   Modules accepted: Level of Service

## 2022-07-23 NOTE — Patient Instructions (Signed)
Thanks for coming in today.  Restart same medications except for the inhaler we can try Advair 250/50 as you may not need the high-dose and this may be less costly.  Let me know if that inhaler is still costly and we can look at alternatives.  Albuterol if needed for breakthrough asthma symptoms.  I suspect mood symptoms will improve on the sertraline 25 mg, but we do have some room to increase that medicine if needed.  Keep me posted.  Follow-up in the next 4 to 6 weeks for physical, sooner if any new or worsening symptoms.  Take care!

## 2022-07-28 ENCOUNTER — Ambulatory Visit
Admission: RE | Admit: 2022-07-28 | Discharge: 2022-07-28 | Disposition: A | Discharge status: Home / Self Care | Payer: 59 | Source: Ambulatory Visit | Attending: Obstetrics | Admitting: Obstetrics

## 2022-07-28 ENCOUNTER — Other Ambulatory Visit: Payer: Self-pay | Admitting: Obstetrics

## 2022-07-28 DIAGNOSIS — R928 Other abnormal and inconclusive findings on diagnostic imaging of breast: Secondary | ICD-10-CM

## 2022-07-28 DIAGNOSIS — R921 Mammographic calcification found on diagnostic imaging of breast: Secondary | ICD-10-CM

## 2022-07-31 ENCOUNTER — Other Ambulatory Visit: Payer: Self-pay | Admitting: Family Medicine

## 2022-07-31 DIAGNOSIS — E785 Hyperlipidemia, unspecified: Secondary | ICD-10-CM

## 2022-07-31 NOTE — Progress Notes (Signed)
Cmp and lipid panel lab only orders for follow-up elevated protein, hyperlipidemia on meds.

## 2022-08-07 ENCOUNTER — Other Ambulatory Visit: Payer: Self-pay | Admitting: Obstetrics

## 2022-08-07 ENCOUNTER — Ambulatory Visit
Admission: RE | Admit: 2022-08-07 | Discharge: 2022-08-07 | Disposition: A | Discharge status: Home / Self Care | Payer: 59 | Source: Ambulatory Visit | Attending: Obstetrics | Admitting: Obstetrics

## 2022-08-07 DIAGNOSIS — R921 Mammographic calcification found on diagnostic imaging of breast: Secondary | ICD-10-CM

## 2022-08-11 ENCOUNTER — Telehealth: Payer: Self-pay | Admitting: Family Medicine

## 2022-08-11 NOTE — Telephone Encounter (Signed)
error 

## 2022-09-11 ENCOUNTER — Encounter: Payer: Self-pay | Admitting: Family Medicine

## 2022-09-11 ENCOUNTER — Ambulatory Visit (INDEPENDENT_AMBULATORY_CARE_PROVIDER_SITE_OTHER): Payer: 59 | Admitting: Family Medicine

## 2022-09-11 VITALS — BP 138/70 | HR 67 | Temp 98.1°F | Ht 65.0 in | Wt 236.0 lb

## 2022-09-11 DIAGNOSIS — Z13820 Encounter for screening for osteoporosis: Secondary | ICD-10-CM | POA: Diagnosis not present

## 2022-09-11 DIAGNOSIS — I10 Essential (primary) hypertension: Secondary | ICD-10-CM

## 2022-09-11 DIAGNOSIS — Z23 Encounter for immunization: Secondary | ICD-10-CM | POA: Diagnosis not present

## 2022-09-11 DIAGNOSIS — Z0000 Encounter for general adult medical examination without abnormal findings: Secondary | ICD-10-CM | POA: Diagnosis not present

## 2022-09-11 DIAGNOSIS — E785 Hyperlipidemia, unspecified: Secondary | ICD-10-CM | POA: Diagnosis not present

## 2022-09-11 DIAGNOSIS — E2839 Other primary ovarian failure: Secondary | ICD-10-CM | POA: Diagnosis not present

## 2022-09-11 LAB — COMPREHENSIVE METABOLIC PANEL
ALT: 30 U/L (ref 0–35)
AST: 29 U/L (ref 0–37)
Albumin: 4.5 g/dL (ref 3.5–5.2)
Alkaline Phosphatase: 115 U/L (ref 39–117)
BUN: 17 mg/dL (ref 6–23)
CO2: 28 mEq/L (ref 19–32)
Calcium: 9.6 mg/dL (ref 8.4–10.5)
Chloride: 103 mEq/L (ref 96–112)
Creatinine, Ser: 0.8 mg/dL (ref 0.40–1.20)
GFR: 77.1 mL/min (ref >60.00)
Glucose, Bld: 111 mg/dL — ABNORMAL HIGH (ref 70–99)
Potassium: 3.9 mEq/L (ref 3.5–5.1)
Sodium: 139 mEq/L (ref 135–145)
Total Bilirubin: 0.6 mg/dL (ref 0.2–1.2)
Total Protein: 7.6 g/dL (ref 6.0–8.3)

## 2022-09-11 LAB — LIPID PANEL
Cholesterol: 152 mg/dL (ref 0–200)
HDL: 44 mg/dL (ref >39.00)
LDL Cholesterol: 92 mg/dL (ref 0–99)
NonHDL: 108.08
Total CHOL/HDL Ratio: 3
Triglycerides: 82 mg/dL (ref 0.0–149.0)
VLDL: 16.4 mg/dL (ref 0.0–40.0)

## 2022-09-11 NOTE — Progress Notes (Signed)
Subjective:  Patient ID: Nicole Reyes, female    DOB: [birth year]  Age: 66 y.o. MRN: [id number]  CC:  Chief Complaint  Patient presents with   Annual Exam    Pt states all is well Pt is fasting   Depression    PHQ9 - 5    HPI [name] presents for Annual Exam  Chronic medical problems last discussed August 10. Decreased anxiety control off meds, restarted at 25 mg initially of Zoloft with options to increase if incomplete treatment. Better back on zoloft - would like to stay on same dose.  Also decreased hypertension control off meds, restarted meds. BP better today - no home readings. No new side effects.  Advair started for asthma with prior medication nonadherence.  Appeared to have better coverage than previous Symbicort.  Albuterol as needed for breakthrough symptoms. Advair was less costly - taking BID. Less use albuterol - 3 times per week. Flonase working well.  On lipitor qd- last lipids off meds in August.  Fasting today.  No meds for sleep - some noise with neighbors. No treatments.      09/11/2022    9:33 AM 07/23/2022    1:17 PM 07/19/2020    2:21 PM 06/03/2020   11:29 AM 04/18/2020   10:01 AM  Depression screen PHQ 2/9  Decreased Interest 0 0 0 0 0  Down, Depressed, Hopeless 0 0 0 0 0  PHQ - 2 Score 0 0 0 0 0  Altered sleeping _0 Tired, decreased energy _1 Change in appetite 0 0   0  Feeling bad or failure about yourself  0 0   0  Trouble concentrating 0 0   0  Moving slowly or fidgety/restless 0 0   0  Suicidal thoughts 0 0   0  PHQ-9 Score _2 Health Maintenance  Topic Date Due   TETANUS/TDAP  01/21/2021   Pneumonia Vaccine 12+ Years old (2 - PCV) 11/12/2021   COVID-19 Vaccine (3 - Pfizer series) 09/27/2022 (Originally 05/17/2020)   DEXA SCAN  07/24/2023 (Originally 11/12/2021)   MAMMOGRAM  07/28/2024   PAP SMEAR-Modifier  07/16/2025   COLONOSCOPY (Pts 45-66yr Insurance coverage will need to be confirmed)  08/27/2031    INFLUENZA VACCINE  Completed   Hepatitis C Screening  Completed   HIV Screening  Completed   Zoster Vaccines- Shingrix  Completed   HPV VACCINES  Aged Out  Colonoscopy:08/26/21.  Mammogram in august with follow up studies. revealed FIBROADENOMATOID NODULES WITH CALCIFICATIONS in breasts, repeat annual mammogram in August 2024.  Followed by GYN for pap testing - 07/16/22.  No prior   Immunization History  Administered Date(s) Administered   DTaP 01/21/2011   Influenza Split 09/15/2016   Influenza, Seasonal, Injecte, Preservative Fre 09/15/2015   Influenza,inj,Quad PF,6+ Mos 10/02/2017, 08/11/2019   Influenza,inj,quad, With Preservative 09/13/2018   Influenza-Unspecified 09/13/2013, 09/15/2016   PFIZER(Purple Top)SARS-COV-2 Vaccination 03/01/2020, 03/22/2020   Pneumococcal Polysaccharide-23 03/27/2013   Zoster Recombinat (Shingrix) 06/30/2019  Covid booster recommended.  Had 2 shingrix.  Flu vaccine 2 weeks ago. Pharmacy.  Pneumonia vaccine today.   No results found. Has seen optho in past year.   Dental: Overdue - plans to schedule appt.   Alcohol:none  Tobacco: none  Exercise: active at work only. Walking on indoor track.   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  Acute otitis media 01/08/2019   Sore throat 01/08/2019   Osteoarthritis of right knee 08/31/2018   Right knee DJD 08/31/2018   Allergic reaction 05/23/2018   Allergic urticaria 05/23/2018   Perennial and seasonal allergic rhinitis 05/23/2018   Allergic conjunctivitis 05/23/2018   Food allergy 05/23/2018   Carpal tunnel syndrome of left wrist 01/25/2018   Lattice degeneration 11/20/2017   Dry eye syndrome 11/20/2017   Cataracts, bilateral 11/20/2017   Chronic right SI joint pain 01/28/2016   Nut allergy 01/28/2016   Obesity 01/25/2015   Pure hypercholesterolemia 02/10/2014   Moderate persistent asthma 12/19/2012   Hypertensive disorder 12/19/2012   Past  Medical History:  Diagnosis Date   Allergy    Anxiety    Arthritis    Asthma    Glaucoma    Hypertension    Past Surgical History:  Procedure Laterality Date   COLONOSCOPY  06/29/2011   Normal   COLONOSCOPY&ENDOSCOPY  08/15/1979   PT UNSURE PLACE&MD WHO DID EXAMS=NORMAL   DILATION AND CURETTAGE OF UTERUS     LAPAROSCOPY     REMOVED SCARE TISSUE FROM OVARIES   ROTATOR CUFF REPAIR  04/24/2022   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 Authorizing Provider  albuterol (PROAIR HFA) 108 (90 Base) MCG/ACT inhaler TAKE 2 PUFFS BY MOUTH EVERY 6 HOURS AS NEEDED FOR WHEEZE 07/23/22  Yes Wendie Agreste, MD  amLODipine (NORVASC) 10 MG tablet Take 1 tablet (10 mg total) by mouth daily. 07/23/22  Yes Wendie Agreste, MD  aspirin EC 81 MG tablet Take 1 tablet (81 mg total) by mouth in the morning and at bedtime. Swallow whole. 10/04/20  Yes Lacie Draft M, PA-C  atorvastatin (LIPITOR) 10 MG tablet Take 1 tablet (10 mg total) by mouth daily. 07/23/22  Yes Wendie Agreste, MD  cholecalciferol (VITAMIN D3) 25 MCG (1000 UNIT) tablet Take 1,000 Units by mouth daily.   Yes [provider]  EPINEPHrine 0.3 mg/0.3 mL IJ SOAJ injection Inject 0.3 mLs (0.3 mg total) into the muscle as needed for anaphylaxis. 03/28/20  Yes Wendie Agreste, MD  fluticasone [facility]) 50 MCG/ACT nasal spray Place 2 sprays into both nostrils daily as needed (allergies.).  07/23/22  Yes Wendie Agreste, MD  fluticasone-salmeterol (ADVAIR) 250-50 MCG/ACT AEPB Inhale 1 puff into the lungs in the morning and at bedtime. 07/23/22  Yes Wendie Agreste, MD  lisinopril-hydrochlorothiazide (ZESTORETIC) 20-25 MG tablet Take 1 tablet by mouth daily. 07/23/22  Yes Wendie Agreste, MD  sertraline (ZOLOFT) 25 MG tablet Take 1 tablet (25 mg total) by mouth daily. 07/23/22  Yes Wendie Agreste, MD   Social History   Socioeconomic History   Marital status: Single    Spouse name: n/a   Number of children: 0   Years of education: Not on file   Highest education level: Not on file  Occupational History   Occupation: employed    Employer: [employer]  Tobacco Use   Smoking status: Never   Smokeless tobacco: Never  Vaping Use   Vaping Use: Never used  Substance and Sexual Activity   Alcohol use: No    Alcohol/week: 0.0 standard drinks of alcohol   Drug use: No   Sexual activity: Yes    Birth control/protection: Post-menopausal  Other Topics Concern   Not on file  Social History Narrative   Marital status:  Single; not dating      Children: none      Lives: alone      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      Tobacco: none      Alcohol: none      Exercise:  Four days per week.   Social Determinants of Health   Financial Resource Strain: Not on file  Food Insecurity: Not on file  Transportation Needs: Not on file  Physical Activity: Not on file  Stress: Not on file  Social Connections: Not on file  Intimate Partner Violence: Not on file    Review of Systems 13 point review of systems per patient health survey noted.  Negative other than as indicated above or in HPI.    Objective:   Vitals:   09/11/22 0936  BP: 138/70  Pulse: 67  Temp: 98.1 F (36.7 C)  SpO2: 95%  Weight: 236 lb (107 kg)  Height: 5' 5" (1.651 m)     Physical Exam Constitutional:      Appearance: She is well-developed.  HENT:     Head: Normocephalic and  atraumatic.     Right Ear: External ear normal.     Left Ear: External ear normal.  Eyes:     Conjunctiva/sclera: Conjunctivae normal.     Pupils: Pupils are equal, round, and reactive to light.  Neck:     Thyroid: No thyromegaly.  Cardiovascular:     Rate and Rhythm: Normal rate and regular rhythm.     Heart sounds: Normal heart sounds. No murmur heard. Pulmonary:     Effort: Pulmonary effort is normal. No respiratory distress.     Breath sounds: Normal breath sounds. No wheezing.  Abdominal:     General: Bowel sounds are normal.     Palpations: Abdomen is soft.     Tenderness: There is no abdominal tenderness.  Musculoskeletal:        General: No tenderness. Normal range of motion.     Cervical back: Normal range of motion and neck supple.  Lymphadenopathy:     Cervical: No cervical adenopathy.  Skin:    General: Skin is warm and dry.     Findings: No rash.  Neurological:     Mental Status: She is alert and oriented to person, place, and time.  Psychiatric:        Behavior: Behavior normal.        Thought Content: Thought content normal.       Assessment & Plan:  Ameisha Mcclellan is a 66 y.o. female . Annual physical exam  - -anticipatory guidance as below in AVS, screening labs above. Health maintenance items as above in HPI discussed/recommended as applicable.   - if persistent need for albuterol, would recommend higher dose of Advair, RTC precautions.  Screening for osteoporosis - Plan: DG Bone Density Estrogen deficiency - Plan: DG Bone Density  Need for pneumococcal vaccination - Plan: Pneumococcal conjugate vaccine 20-valent (Prevnar 20)  Hyperlipidemia, unspecified hyperlipidemia type - Plan: Comprehensive metabolic panel, Lipid panel  -Repeat labs  on meds.  Tolerating current dose.  Adjust medications accordingly based on results.  Essential hypertension - Plan: Comprehensive metabolic panel  -Stable with current regimen.  No changes.  No orders of the  defined types were placed in this encounter.  Patient Instructions  Advair should lessen need for albuterol. Let me know if you continue to require albuterol more than 2 days per week and we can go to higher advair dose if needed.  No med changes today - I will recheck labs.  Try melatonin for sleep. See other info below. If that does not help, schedule visit and we can discuss other options. Covid booster at pharmacy. RSV vaccine can also be given at your pharmacy.  [email or web address] I will order bone density.  Goal exercise 168mn per week.   Insomnia Insomnia is a sleep disorder that makes it difficult to fall asleep or stay asleep. Insomnia can cause fatigue, low energy, difficulty concentrating, mood swings, and poor performance at work or school. There are three different ways to classify insomnia: Difficulty falling asleep. Difficulty staying asleep. Waking up too early in the morning. Any type of insomnia can be long-term (chronic) or short-term (acute). Both are common. Short-term insomnia usually lasts for 3 months or less. Chronic insomnia occurs at least three times a week for longer than 3 months. What are the causes? Insomnia may be caused by another condition, situation, or substance, such as: Having certain mental health conditions, such as anxiety and depression. Using caffeine, alcohol, tobacco, or drugs. Having gastrointestinal conditions, such as gastroesophageal reflux disease (GERD). Having certain medical conditions. These include: Asthma. Alzheimer's disease. Stroke. Chronic pain. An overactive thyroid gland (hyperthyroidism). Other sleep disorders, such as restless legs syndrome and sleep apnea. Menopause. Sometimes, the cause of insomnia may not be known. What increases the risk? Risk factors for insomnia include: Gender. Females are affected more often than males. Age. Insomnia is more common as people get  older. Stress and certain medical and mental health conditions. Lack of exercise. Having an irregular work schedule. This may include working night shifts and traveling between different time zones. What are the signs or symptoms? If you have insomnia, the main symptom is having trouble falling asleep or having trouble staying asleep. This may lead to other symptoms, such as: Feeling tired or having low energy. Feeling nervous about going to sleep. Not feeling rested in the morning. Having trouble concentrating. Feeling irritable, anxious, or depressed. How is this diagnosed? This condition may be diagnosed based on: Your symptoms and medical history. Your health care provider may ask about: Your sleep habits. Any medical conditions you have. Your mental health. A physical exam. How is this treated? Treatment for insomnia depends on the cause. Treatment may focus on treating an underlying condition that is causing the insomnia. Treatment may also include: Medicines to help you sleep. Counseling or therapy. Lifestyle adjustments to help you sleep better. Follow these instructions at home: Eating and drinking  Limit or avoid alcohol, caffeinated beverages, and products that contain nicotine and tobacco, especially close to bedtime. These can disrupt your sleep. Do not eat a large meal or eat spicy foods right before bedtime. This can lead to digestive discomfort that can make it hard for you to sleep. Sleep habits  Keep a sleep diary to help you and your health care provider figure out what could be causing your insomnia. Write down: When you sleep. When you wake up during the night. How well you sleep and how rested  you feel the next day. Any side effects of medicines you are taking. What you eat and drink. Make your bedroom a dark, comfortable place where it is easy to fall asleep. Put up shades or blackout curtains to block light from outside. Use a white noise machine to block  noise. Keep the temperature cool. Limit screen use before bedtime. This includes: Not watching TV. Not using your smartphone, tablet, or computer. Stick to a routine that includes going to bed and waking up at the same times every day and night. This can help you fall asleep faster. Consider making a quiet activity, such as reading, part of your nighttime routine. Try to avoid taking naps during the day so that you sleep better at night. Get out of bed if you are still awake after 15 minutes of trying to sleep. Keep the lights down, but try reading or doing a quiet activity. When you feel sleepy, go back to bed. General instructions Take over-the-counter and prescription medicines only as told by your health care provider. Exercise regularly as told by your health care provider. However, avoid exercising in the hours right before bedtime. Use relaxation techniques to manage stress. Ask your health care provider to suggest some techniques that may work well for you. These may include: Breathing exercises. Routines to release muscle tension. Visualizing peaceful scenes. Make sure that you drive carefully. Do not drive if you feel very sleepy. Keep all follow-up visits. This is important. Contact a health care provider if: You are tired throughout the day. You have trouble in your daily routine due to sleepiness. You continue to have sleep problems, or your sleep problems get worse. Get help right away if: You have thoughts about hurting yourself or someone else. Get help right away if you feel like you may hurt yourself or others, or have thoughts about taking your own life. Go to your nearest emergency room or: Call 911. Call the [place] at 228-006-7169 or 988. This is open 24 hours a day. Text the Crisis Text Line at 913-886-8511. Summary Insomnia is a sleep disorder that makes it difficult to fall asleep or stay asleep. Insomnia can be long-term (chronic) or  short-term (acute). Treatment for insomnia depends on the cause. Treatment may focus on treating an underlying condition that is causing the insomnia. Keep a sleep diary to help you and your health care provider figure out what could be causing your insomnia. This information is not intended to replace advice given to you by your health care provider. Make sure you discuss any questions you have with your health care provider. Document Revised: 11/10/2021 Document Reviewed: 11/10/2021 Elsevier Patient Education  [place] 65 Years and Older, Female Preventive care refers to lifestyle choices and visits with your health care provider that can promote health and wellness. Preventive care visits are also called wellness exams. What can I expect for my preventive care visit? Counseling Your health care provider may ask you questions about your: Medical history, including: Past medical problems. Family medical history. Pregnancy and menstrual history. History of falls. Current health, including: Memory and ability to understand (cognition). Emotional well-being. Home life and relationship well-being. Sexual activity and sexual health. Lifestyle, including: Alcohol, nicotine or tobacco, and drug use. Access to firearms. Diet, exercise, and sleep habits. Work and work Statistician. Sunscreen use. Safety issues such as seatbelt and bike helmet use. Physical exam Your health care provider will check your: Height and weight. These  may be used to calculate your BMI (body mass index). BMI is a measurement that tells if you are at a healthy weight. Waist circumference. This measures the distance around your waistline. This measurement also tells if you are at a healthy weight and may help predict your risk of certain diseases, such as type 2 diabetes and high blood pressure. Heart rate and blood pressure. Body temperature. Skin for abnormal spots. What immunizations  do I need?  Vaccines are usually given at various ages, according to a schedule. Your health care provider will recommend vaccines for you based on your age, medical history, and lifestyle or other factors, such as travel or where you work. What tests do I need? Screening Your health care provider may recommend screening tests for certain conditions. This may include: Lipid and cholesterol levels. Hepatitis C test. Hepatitis B test. HIV (human immunodeficiency virus) test. STI (sexually transmitted infection) testing, if you are at risk. Lung cancer screening. Colorectal cancer screening. Diabetes screening. This is done by checking your blood sugar (glucose) after you have not eaten for a while (fasting). Mammogram. Talk with your health care provider about how often you should have regular mammograms. BRCA-related cancer screening. This may be done if you have a family history of breast, ovarian, tubal, or peritoneal cancers. Bone density scan. This is done to screen for osteoporosis. Talk with your health care provider about your test results, treatment options, and if necessary, the need for more tests. Follow these instructions at home: Eating and drinking  Eat a diet that includes fresh fruits and vegetables, whole grains, lean protein, and low-fat dairy products. Limit your intake of foods with high amounts of sugar, saturated fats, and salt. Take vitamin and mineral supplements as recommended by your health care provider. Do not drink alcohol if your health care provider tells you not to drink. If you drink alcohol: Limit how much you have to 0-1 drink a day. Know how much alcohol is in your drink. In the [place] one drink equals one 12 oz bottle of beer (355 mL), one 5 oz glass of wine (148 mL), or one 1 oz glass of hard liquor (44 mL). Lifestyle Brush your teeth every morning and night with fluoride toothpaste. Floss one time each day. Exercise for at least 30 minutes 5 or more  days each week. Do not use any products that contain nicotine or tobacco. These products include cigarettes, chewing tobacco, and vaping devices, such as e-cigarettes. If you need help quitting, ask your health care provider. Do not use drugs. If you are sexually active, practice safe sex. Use a condom or other form of protection in order to prevent STIs. Take aspirin only as told by your health care provider. Make sure that you understand how much to take and what form to take. Work with your health care provider to find out whether it is safe and beneficial for you to take aspirin daily. Ask your health care provider if you need to take a cholesterol-lowering medicine (statin). Find healthy ways to manage stress, such as: Meditation, yoga, or listening to music. Journaling. Talking to a trusted person. Spending time with friends and family. Minimize exposure to UV radiation to reduce your risk of skin cancer. Safety Always wear your seat belt while driving or riding in a vehicle. Do not drive: If you have been drinking alcohol. Do not ride with someone who has been drinking. When you are tired or distracted. While texting. If you have been using  any mind-altering substances or drugs. Wear a helmet and other protective equipment during sports activities. If you have firearms in your house, make sure you follow all gun safety procedures. What's next? Visit your health care provider once a year for an annual wellness visit. Ask your health care provider how often you should have your eyes and teeth checked. Stay up to date on all vaccines. This information is not intended to replace advice given to you by your health care provider. Make sure you discuss any questions you have with your health care provider. Document Revised: 05/28/2021 Document Reviewed: 05/28/2021 Elsevier Patient Education  [place],   Merri Ray, MD [place] 09/11/22 10:28 AM

## 2022-09-11 NOTE — Patient Instructions (Addendum)
Advair should lessen need for albuterol. Let me know if you continue to require albuterol more than 2 days per week and we can go to higher advair dose if needed.  No med changes today - I will recheck labs.  Try melatonin for sleep. See other info below. If that does not help, schedule visit and we can discuss other options. Covid booster at pharmacy. RSV vaccine can also be given at your pharmacy.  [email or web address] I will order bone density.  Goal exercise 147mn per week.   Insomnia Insomnia is a sleep disorder that makes it difficult to fall asleep or stay asleep. Insomnia can cause fatigue, low energy, difficulty concentrating, mood swings, and poor performance at work or school. There are three different ways to classify insomnia: Difficulty falling asleep. Difficulty staying asleep. Waking up too early in the morning. Any type of insomnia can be long-term (chronic) or short-term (acute). Both are common. Short-term insomnia usually lasts for 3 months or less. Chronic insomnia occurs at least three times a week for longer than 3 months. What are the causes? Insomnia may be caused by another condition, situation, or substance, such as: Having certain mental health conditions, such as anxiety and depression. Using caffeine, alcohol, tobacco, or drugs. Having gastrointestinal conditions, such as gastroesophageal reflux disease (GERD). Having certain medical conditions. These include: Asthma. Alzheimer's disease. Stroke. Chronic pain. An overactive thyroid gland (hyperthyroidism). Other sleep disorders, such as restless legs syndrome and sleep apnea. Menopause. Sometimes, the cause of insomnia may not be known. What increases the risk? Risk factors for insomnia include: Gender. Females are affected more often than males. Age. Insomnia is more common as people get older. Stress and certain medical and mental health conditions. Lack of  exercise. Having an irregular work schedule. This may include working night shifts and traveling between different time zones. What are the signs or symptoms? If you have insomnia, the main symptom is having trouble falling asleep or having trouble staying asleep. This may lead to other symptoms, such as: Feeling tired or having low energy. Feeling nervous about going to sleep. Not feeling rested in the morning. Having trouble concentrating. Feeling irritable, anxious, or depressed. How is this diagnosed? This condition may be diagnosed based on: Your symptoms and medical history. Your health care provider may ask about: Your sleep habits. Any medical conditions you have. Your mental health. A physical exam. How is this treated? Treatment for insomnia depends on the cause. Treatment may focus on treating an underlying condition that is causing the insomnia. Treatment may also include: Medicines to help you sleep. Counseling or therapy. Lifestyle adjustments to help you sleep better. Follow these instructions at home: Eating and drinking  Limit or avoid alcohol, caffeinated beverages, and products that contain nicotine and tobacco, especially close to bedtime. These can disrupt your sleep. Do not eat a large meal or eat spicy foods right before bedtime. This can lead to digestive discomfort that can make it hard for you to sleep. Sleep habits  Keep a sleep diary to help you and your health care provider figure out what could be causing your insomnia. Write down: When you sleep. When you wake up during the night. How well you sleep and how rested you feel the next day. Any side effects of medicines you are taking. What you eat and drink. Make your bedroom a dark, comfortable place where it is easy to fall asleep. Put up shades or blackout curtains to block light from outside. Use a white  noise machine to block noise. Keep the temperature cool. Limit screen use before bedtime. This  includes: Not watching TV. Not using your smartphone, tablet, or computer. Stick to a routine that includes going to bed and waking up at the same times every day and night. This can help you fall asleep faster. Consider making a quiet activity, such as reading, part of your nighttime routine. Try to avoid taking naps during the day so that you sleep better at night. Get out of bed if you are still awake after 15 minutes of trying to sleep. Keep the lights down, but try reading or doing a quiet activity. When you feel sleepy, go back to bed. General instructions Take over-the-counter and prescription medicines only as told by your health care provider. Exercise regularly as told by your health care provider. However, avoid exercising in the hours right before bedtime. Use relaxation techniques to manage stress. Ask your health care provider to suggest some techniques that may work well for you. These may include: Breathing exercises. Routines to release muscle tension. Visualizing peaceful scenes. Make sure that you drive carefully. Do not drive if you feel very sleepy. Keep all follow-up visits. This is important. Contact a health care provider if: You are tired throughout the day. You have trouble in your daily routine due to sleepiness. You continue to have sleep problems, or your sleep problems get worse. Get help right away if: You have thoughts about hurting yourself or someone else. Get help right away if you feel like you may hurt yourself or others, or have thoughts about taking your own life. Go to your nearest emergency room or: Call 911. Call the [facility] at [phone] or 988. This is open 24 hours a day. Text the Crisis Text Line at 9036422740. Summary Insomnia is a sleep disorder that makes it difficult to fall asleep or stay asleep. Insomnia can be long-term (chronic) or short-term (acute). Treatment for insomnia depends on the cause. Treatment  may focus on treating an underlying condition that is causing the insomnia. Keep a sleep diary to help you and your health care provider figure out what could be causing your insomnia. This information is not intended to replace advice given to you by your health care provider. Make sure you discuss any questions you have with your health care provider. Document Revised: 11/10/2021 Document Reviewed: 11/10/2021 Elsevier Patient Education  [place] 65 Years and Older, Female Preventive care refers to lifestyle choices and visits with your health care provider that can promote health and wellness. Preventive care visits are also called wellness exams. What can I expect for my preventive care visit? Counseling Your health care provider may ask you questions about your: Medical history, including: Past medical problems. Family medical history. Pregnancy and menstrual history. History of falls. Current health, including: Memory and ability to understand (cognition). Emotional well-being. Home life and relationship well-being. Sexual activity and sexual health. Lifestyle, including: Alcohol, nicotine or tobacco, and drug use. Access to firearms. Diet, exercise, and sleep habits. Work and work Statistician. Sunscreen use. Safety issues such as seatbelt and bike helmet use. Physical exam Your health care provider will check your: Height and weight. These may be used to calculate your BMI (body mass index). BMI is a measurement that tells if you are at a healthy weight. Waist circumference. This measures the distance around your waistline. This measurement also tells if you are at a healthy weight and may help  predict your risk of certain diseases, such as type 2 diabetes and high blood pressure. Heart rate and blood pressure. Body temperature. Skin for abnormal spots. What immunizations do I need?  Vaccines are usually given at various ages, according to a  schedule. Your health care provider will recommend vaccines for you based on your age, medical history, and lifestyle or other factors, such as travel or where you work. What tests do I need? Screening Your health care provider may recommend screening tests for certain conditions. This may include: Lipid and cholesterol levels. Hepatitis C test. Hepatitis B test. HIV (human immunodeficiency virus) test. STI (sexually transmitted infection) testing, if you are at risk. Lung cancer screening. Colorectal cancer screening. Diabetes screening. This is done by checking your blood sugar (glucose) after you have not eaten for a while (fasting). Mammogram. Talk with your health care provider about how often you should have regular mammograms. BRCA-related cancer screening. This may be done if you have a family history of breast, ovarian, tubal, or peritoneal cancers. Bone density scan. This is done to screen for osteoporosis. Talk with your health care provider about your test results, treatment options, and if necessary, the need for more tests. Follow these instructions at home: Eating and drinking  Eat a diet that includes fresh fruits and vegetables, whole grains, lean protein, and low-fat dairy products. Limit your intake of foods with high amounts of sugar, saturated fats, and salt. Take vitamin and mineral supplements as recommended by your health care provider. Do not drink alcohol if your health care provider tells you not to drink. If you drink alcohol: Limit how much you have to 0-1 drink a day. Know how much alcohol is in your drink. In the [place] one drink equals one 12 oz bottle of beer (355 mL), one 5 oz glass of wine (148 mL), or one 1 oz glass of hard liquor (44 mL). Lifestyle Brush your teeth every morning and night with fluoride toothpaste. Floss one time each day. Exercise for at least 30 minutes 5 or more days each week. Do not use any products that contain nicotine or  tobacco. These products include cigarettes, chewing tobacco, and vaping devices, such as e-cigarettes. If you need help quitting, ask your health care provider. Do not use drugs. If you are sexually active, practice safe sex. Use a condom or other form of protection in order to prevent STIs. Take aspirin only as told by your health care provider. Make sure that you understand how much to take and what form to take. Work with your health care provider to find out whether it is safe and beneficial for you to take aspirin daily. Ask your health care provider if you need to take a cholesterol-lowering medicine (statin). Find healthy ways to manage stress, such as: Meditation, yoga, or listening to music. Journaling. Talking to a trusted person. Spending time with friends and family. Minimize exposure to UV radiation to reduce your risk of skin cancer. Safety Always wear your seat belt while driving or riding in a vehicle. Do not drive: If you have been drinking alcohol. Do not ride with someone who has been drinking. When you are tired or distracted. While texting. If you have been using any mind-altering substances or drugs. Wear a helmet and other protective equipment during sports activities. If you have firearms in your house, make sure you follow all gun safety procedures. What's next? Visit your health care provider once a year for an annual wellness visit. Ask  your health care provider how often you should have your eyes and teeth checked. Stay up to date on all vaccines. This information is not intended to replace advice given to you by your health care provider. Make sure you discuss any questions you have with your health care provider. Document Revised: 05/28/2021 Document Reviewed: 05/28/2021 Elsevier Patient Education  [place].

## 2022-10-06 ENCOUNTER — Other Ambulatory Visit: Payer: Self-pay | Admitting: Family Medicine

## 2022-10-06 DIAGNOSIS — J45909 Unspecified asthma, uncomplicated: Secondary | ICD-10-CM

## 2022-11-16 ENCOUNTER — Encounter: Payer: Self-pay | Admitting: Family Medicine

## 2022-11-16 ENCOUNTER — Ambulatory Visit (INDEPENDENT_AMBULATORY_CARE_PROVIDER_SITE_OTHER): Payer: 59 | Admitting: Family Medicine

## 2022-11-16 VITALS — BP 138/72 | HR 76 | Temp 98.3°F | Ht 65.0 in | Wt 235.8 lb

## 2022-11-16 DIAGNOSIS — J45909 Unspecified asthma, uncomplicated: Secondary | ICD-10-CM | POA: Diagnosis not present

## 2022-11-16 DIAGNOSIS — J22 Unspecified acute lower respiratory infection: Secondary | ICD-10-CM | POA: Diagnosis not present

## 2022-11-16 DIAGNOSIS — R059 Cough, unspecified: Secondary | ICD-10-CM

## 2022-11-16 LAB — POC COVID19 BINAXNOW: SARS Coronavirus 2 Ag: NEGATIVE

## 2022-11-16 MED ORDER — AZITHROMYCIN 250 MG PO TABS: ORAL_TABLET | ORAL | 0 refills | Status: AC

## 2022-11-16 MED ORDER — PREDNISONE 20 MG PO TABS: 40.0000 mg | ORAL_TABLET | Freq: Every day | ORAL | 0 refills | Status: DC

## 2022-11-16 NOTE — Patient Instructions (Signed)
Dust at work may have worsened allergy/asthma initially, but now appear to be an asthma flare as well as possible early bronchitis with the discolored/worsening phlegm.  Start azithromycin antibiotic, prednisone 2 pills/day for the next 5 days.  Okay to continue albuterol if needed up to every 4-6 hours, continue Advair.  I expect you to be improving fairly quickly.  If any worsening symptoms, be seen as we discussed.  Dust mask can be helpful if increased dust exposure, continue Flonase nasal spray for allergies, option to add Allegra or Claritin once per day.  Return to the clinic or go to the nearest emergency room if any of your symptoms worsen or new symptoms occur.  Cough, Adult Coughing is a reflex that clears your throat and your airways (respiratory system). Coughing helps to heal and protect your lungs. It is normal to cough occasionally, but a cough that happens with other symptoms or lasts a long time may be a sign of a condition that needs treatment. An acute cough may only last 2-3 weeks, while a chronic cough may last 8 or more weeks. Coughing is commonly caused by: Infection of the respiratory systemby viruses or bacteria. Breathing in substances that irritate your lungs. Allergies. Asthma. Mucus that runs down the back of your throat (postnasal drip). Smoking. Acid backing up from the stomach into the esophagus (gastroesophageal reflux). Certain medicines. Chronic lung problems. Other medical conditions such as heart failure or a blood clot in the lung (pulmonary embolism). Follow these instructions at home: Medicines Take over-the-counter and prescription medicines only as told by your health care provider. Talk with your health care provider before you take a cough suppressant medicine. Lifestyle Avoid cigarette smoke. Do not use any products that contain nicotine or tobacco, such as cigarettes, e-cigarettes, and chewing tobacco. If you need help quitting, ask your health  care provider. Drink enough fluid to keep your urine pale yellow. Avoid caffeine. Do not drink alcohol if your health care provider tells you not to drink. General instructions  Pay close attention to changes in your cough. Tell your health care provider about them. Always cover your mouth when you cough. Avoid things that make you cough, such as perfume, candles, cleaning products, or campfire or tobacco smoke. If the air is dry, use a cool mist vaporizer or humidifier in your bedroom or your home to help loosen secretions. If your cough is worse at night, try to sleep in a semi-upright position. Rest as needed. Keep all follow-up visits as told by your health care provider. This is important. Contact a health care provider if you: Have new symptoms. Cough up pus. Have a cough that does not get better after 2-3 weeks or gets worse. Cannot control your cough with cough suppressant medicines and you are losing sleep. Have pain that gets worse or pain that is not helped with medicine. Have a fever. Have unexplained weight loss. Have night sweats. Get help right away if: You cough up blood. You have difficulty breathing. Your heartbeat is very fast. These symptoms may represent a serious problem that is an emergency. Do not wait to see if the symptoms will go away. Get medical help right away. Call your local emergency services (911 in the [place] Do not drive yourself to the hospital. Summary Coughing is a reflex that clears your throat and your airways. It is normal to cough occasionally, but a cough that happens with other symptoms or lasts a long time may be a sign of a  condition that needs treatment. Take over-the-counter and prescription medicines only as told by your health care provider. Always cover your mouth when you cough. Contact a health care provider if you have new symptoms or a cough that does not get better after 2-3 weeks or gets worse. This information is not intended  to replace advice given to you by your health care provider. Make sure you discuss any questions you have with your health care provider. Document Revised: 05/05/2022 Document Reviewed: 12/19/2018 Elsevier Patient Education  [place].   Asthma, Adult  Asthma is a long-term (chronic) condition that causes recurrent episodes in which the lower airways in the lungs become tight and narrow. The narrowing is caused by inflammation and tightening of the smooth muscle around the lower airways. Asthma episodes, also called asthma attacks or asthma flares, may cause coughing, making high-pitched whistling sounds when you breathe, most often when you breathe out (wheezing), shortness of breath, and chest pain. The airways may produce extra mucus caused by the inflammation and irritation. During an attack, it can be difficult to breathe. Asthma attacks can range from minor to life-threatening. Asthma cannot be cured, but medicines and lifestyle changes can help control it and treat acute attacks. It is important to keep your asthma well controlled so the condition does not interfere with your daily life. What are the causes? This condition is believed to be caused by inherited (genetic) and environmental factors, but its exact cause is not known. What can trigger an asthma attack? Many things can bring on an asthma attack or make symptoms worse. These triggers are different for every person. Common triggers include: Allergens and irritants like mold, dust, pet dander, cockroaches, pollen, air pollution, and chemical odors. Cigarette smoke. Weather changes and cold air. Stress and strong emotional responses such as crying or laughing hard. Certain medications such as aspirin or beta blockers. Infections and inflammatory conditions, such as the flu, a cold, pneumonia, or inflammation of the nasal membranes (rhinitis). Gastroesophageal reflux disease (GERD). What are the signs or symptoms? Symptoms  may occur right after exposure to an asthma trigger or hours later and can vary by person. Common signs and symptoms include: Wheezing. Trouble breathing (shortness of breath). Excessive nighttime or early morning coughing. Chest tightness. Tiredness (fatigue) with minimal activity. Difficulty talking in complete sentences. Poor exercise tolerance. How is this diagnosed? This condition is diagnosed based on: A physical exam and your medical history. Tests, which may include: Lung function studies to evaluate the flow of air in your lungs. Allergy tests. Imaging tests, such as X-rays. How is this treated? There is no cure, but symptoms can be controlled with proper treatment. Treatment usually involves: Identifying and avoiding your asthma triggers. Inhaled medicines. Two types are commonly used to treat asthma, depending on severity: Controller medicines. These help prevent asthma symptoms from occurring. They are taken every day. Fast-acting reliever or rescue medicines. These quickly relieve asthma symptoms. They are used as needed and provide short-term relief. Using other medicines, such as: Allergy medicines, such as antihistamines, if your asthma attacks are triggered by allergens. Immune medicines (immunomodulators). These are medicines that help control the immune system. Using supplemental oxygen. This is only needed during a severe episode. Creating an asthma action plan. An asthma action plan is a written plan for managing and treating your asthma attacks. This plan includes: A list of your asthma triggers and how to avoid them. Information about when medicines should be taken and when their dosage  should be changed. Instructions about using a device called a peak flow meter. A peak flow meter measures how well the lungs are working and the severity of your asthma. It helps you monitor your condition. Follow these instructions at home: Take over-the-counter and prescription  medicines only as told by your health care provider. Stay up to date on all vaccinations as recommended by your healthcare provider, including vaccines for the flu and pneumonia. Use a peak flow meter and keep track of your peak flow readings. Understand and use your asthma action plan to address any asthma flares. Do not smoke or allow anyone to smoke in your home. Contact a health care provider if: You have wheezing, shortness of breath, or a cough that is not responding to medicines. Your medicines are causing side effects, such as a rash, itching, swelling, or trouble breathing. You need to use a reliever medicine more than 2-3 times a week. Your peak flow reading is still at 50-79% of your personal best after following your action plan for 1 hour. You have a fever and shortness of breath. Get help right away if: You are getting worse and do not respond to treatment during an asthma attack. You are short of breath when at rest or when doing very little physical activity. You have difficulty eating, drinking, or talking. You have chest pain or tightness. You develop a fast heartbeat or palpitations. You have a bluish color to your lips or fingernails. You are light-headed or dizzy, or you faint. Your peak flow reading is less than 50% of your personal best. You feel too tired to breathe normally. These symptoms may be an emergency. Get help right away. Call 911. Do not wait to see if the symptoms will go away. Do not drive yourself to the hospital. Summary Asthma is a long-term (chronic) condition that causes recurrent episodes in which the airways become tight and narrow. Asthma episodes, also called asthma attacks or asthma flares, can cause coughing, wheezing, shortness of breath, and chest pain. Asthma cannot be cured, but medicines and lifestyle changes can help keep it well controlled and prevent asthma flares. Make sure you understand how to avoid triggers and how and when to use  your medicines. Asthma attacks can range from minor to life-threatening. Get help right away if you have an asthma attack and do not respond to treatment with your usual rescue medicines. This information is not intended to replace advice given to you by your health care provider. Make sure you discuss any questions you have with your health care provider. Document Revised: 09/17/2021 Document Reviewed: 09/08/2021 Elsevier Patient Education  [place].

## 2022-11-16 NOTE — Progress Notes (Signed)
Subjective:  Patient ID: Nicole Reyes, female    DOB: Mar 14, 1956  Age: 66 y.o. MRN: [id number]  CC:  Chief Complaint  Patient presents with   Cough    Pt has had a cough for 1 month, she has started sneezing, congestion and wheezing just started Saturday     HPI Nicole Reyes presents for   Cough: History of asthma, Advair was restarted at previous visit after prior medication nonadherence.  Was using albuterol last, 3 times per week at her last visit in September.  Was continued to use Flonase for allergies.  Option of higher dose of Advair if persistent albuterol need discussed at September visit.  Today reports cough past few weeks, initially dry, now yellow green phlegm past week. No measured fever, but forehead warm last week.  Some wheeze, no missed doses Advair. Using albuterol infrequently - last used 2 days ago. No home covid testing. Some increased congestion, sneezing. Some wheeze at times. Increased dust at work recently.  Eating/drinking ok, no chest pains. Tx: mucinex DM, nyquil, mucinex sinus Max.     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   Acute otitis media 01/08/2019   Sore throat 01/08/2019   Osteoarthritis of right knee 08/31/2018   Right knee DJD 08/31/2018   Allergic reaction 05/23/2018   Allergic urticaria 05/23/2018   Perennial and seasonal allergic rhinitis 05/23/2018   Allergic conjunctivitis 05/23/2018   Food allergy 05/23/2018   Carpal tunnel syndrome of left wrist 01/25/2018   Lattice degeneration 11/20/2017   Dry eye syndrome 11/20/2017   Cataracts, bilateral 11/20/2017   Chronic right SI joint pain 01/28/2016   Nut allergy 01/28/2016   Obesity 01/25/2015   Pure hypercholesterolemia 02/10/2014   Moderate persistent asthma 12/19/2012   Hypertensive disorder 12/19/2012   Past Medical History:  Diagnosis Date   Allergy    Anxiety    Arthritis    Asthma    Glaucoma     Hypertension    Past Surgical History:  Procedure Laterality Date   COLONOSCOPY  06/29/2011   Normal   COLONOSCOPY&ENDOSCOPY  08/15/1979   PT UNSURE PLACE&MD WHO DID EXAMS=NORMAL   DILATION AND CURETTAGE OF UTERUS     LAPAROSCOPY     REMOVED SCARE TISSUE FROM OVARIES   ROTATOR CUFF REPAIR  04/24/2022   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 Authorizing Provider  albuterol (VENTOLIN HFA) 108 (90 Base) MCG/ACT inhaler INHALE 2 PUFFS BY MOUTH EVERY 6 HOURS AS NEEDED FOR WHEEZE 10/06/22  Yes Wendie Agreste, MD  amLODipine (NORVASC) 10 MG tablet Take 1 tablet (10 mg total) by mouth daily. 07/23/22  Yes Wendie Agreste, MD  aspirin EC 81 MG tablet Take 1 tablet (81 mg total) by mouth in the morning and at bedtime. Swallow whole. 10/04/20  Yes Lacie Draft M, PA-C  atorvastatin (LIPITOR) 10 MG tablet Take 1 tablet (  10 mg total) by mouth daily. 07/23/22  Yes Wendie Agreste, MD  cholecalciferol (VITAMIN D3) 25 MCG (1000 UNIT) tablet Take 1,000 Units by mouth daily.   Yes [provider]  EPINEPHrine 0.3 mg/0.3 mL IJ SOAJ injection Inject 0.3 mLs (0.3 mg total) into the muscle as needed for anaphylaxis. 03/28/20  Yes Wendie Agreste, MD  fluticasone [facility]) 50 MCG/ACT nasal spray Place 2 sprays into both nostrils daily as needed (allergies.). 07/23/22  Yes Wendie Agreste, MD  fluticasone-salmeterol (ADVAIR) 250-50 MCG/ACT AEPB Inhale  1 puff into the lungs in the morning and at bedtime. 07/23/22  Yes Wendie Agreste, MD  lisinopril-hydrochlorothiazide (ZESTORETIC) 20-25 MG tablet Take 1 tablet by mouth daily. 07/23/22  Yes Wendie Agreste, MD  sertraline (ZOLOFT) 25 MG tablet Take 1 tablet (25 mg total) by mouth daily. 07/23/22  Yes Wendie Agreste, MD   Social History   Socioeconomic History   Marital status: Single    Spouse name: n/a   Number of children: 0   Years of education: Not on file   Highest education level: Not on file  Occupational History   Occupation: employed    Employer: [employer]  Tobacco Use   Smoking status: Never   Smokeless tobacco: Never  Vaping Use   Vaping Use: Never used  Substance and Sexual Activity   Alcohol use: No    Alcohol/week: 0.0 standard drinks of alcohol   Drug use: No   Sexual activity: Yes    Birth control/protection: Post-menopausal  Other Topics Concern   Not on file  Social History Narrative   Marital status:  Single; not dating      Children: none      Lives: alone      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      Tobacco: none      Alcohol: none      Exercise:  Four days per week.   Social Determinants of Health   Financial Resource Strain: Not on file  Food Insecurity: Not on file  Transportation Needs: Not on file  Physical Activity: Not on file  Stress: Not on file  Social Connections: Not on file  Intimate Partner Violence: Not on file    Review of Systems Per HPI.   Objective:   Vitals:   11/16/22 0912  BP: 138/72  Pulse: 76  Temp: 98.3 F (36.8 C)  SpO2: 96%  Weight: 235 lb 12.8 oz (107 kg)  Height: '5\' 5"'$  (1.651 m)     Physical Exam Vitals reviewed.  Constitutional:      General: She is not in acute distress.    Appearance: She is well-developed.  HENT:     Head: Normocephalic and atraumatic.     Right Ear: Hearing, tympanic membrane, ear canal and external ear normal.     Left Ear: Hearing, tympanic membrane, ear  canal and external ear normal.     Nose: Nose normal.     Mouth/Throat:     Pharynx: No posterior oropharyngeal erythema.  Eyes:     Conjunctiva/sclera: Conjunctivae normal.     Pupils: Pupils are equal, round, and reactive to light.  Cardiovascular:     Rate and Rhythm: Normal rate and regular rhythm.     Heart sounds: Normal heart sounds. No murmur heard. Pulmonary:     Effort: Pulmonary effort is normal. No respiratory distress.     Breath sounds: No stridor. Wheezing (Diffuse end expiratory, normal effort, no  respiratory distress.) present. No rhonchi.  Skin:    General: Skin is warm and dry.     Findings: No rash.  Neurological:     Mental Status: She is alert and oriented to person, place, and time.  Psychiatric:        Mood and Affect: Mood normal.        Behavior: Behavior normal.    Results for orders placed or performed in visit on 11/16/22  POC COVID-19 BinaxNow  Result Value Ref Range   SARS Coronavirus 2 Ag Negative Negative     Assessment & Plan:  Nicole Reyes is a 66 y.o. female . LRTI (lower respiratory tract infection) - Plan: azithromycin (ZITHROMAX) 250 MG tablet  Extrinsic asthma without complication, unspecified asthma severity, unspecified whether persistent - Plan: predniSONE (DELTASONE) 20 MG tablet  Cough, unspecified type - Plan: POC COVID-19 BinaxNow Suspected initial flare of asthma/allergies with dust exposure with secondary infection, discolored mucus as above.  Persistent wheeze.  Continue Advair, albuterol if needed, add prednisone with potential side effects and risks discussed.  Start azithromycin with potential side effects and risks of antibiotics discussed.  Dust mask if increased dust exposure, addition of nonsedating antihistamine to steroid nasal spray if needed, RTC precautions.  COVID test obtained but unlikely cause -negative.    Meds ordered this encounter  Medications   predniSONE (DELTASONE) 20 MG tablet    Sig: Take 2  tablets (40 mg total) by mouth daily with breakfast.    Dispense:  10 tablet    Refill:  0   azithromycin (ZITHROMAX) 250 MG tablet    Sig: Take 2 tablets on day 1, then 1 tablet daily on days 2 through 5    Dispense:  6 tablet    Refill:  0   Patient Instructions  Dust at work may have worsened allergy/asthma initially, but now appear to be an asthma flare as well as possible early bronchitis with the discolored/worsening phlegm.  Start azithromycin antibiotic, prednisone 2 pills/day for the next 5 days.  Okay to continue albuterol if needed up to every 4-6 hours, continue Advair.  I expect you to be improving fairly quickly.  If any worsening symptoms, be seen as we discussed.  Dust mask can be helpful if increased dust exposure, continue Flonase nasal spray for allergies, option to add Allegra or Claritin once per day.  Return to the clinic or go to the nearest emergency room if any of your symptoms worsen or new symptoms occur.  Cough, Adult Coughing is a reflex that clears your throat and your airways (respiratory system). Coughing helps to heal and protect your lungs. It is normal to cough occasionally, but a cough that happens with other symptoms or lasts a long time may be a sign of a condition that needs treatment. An acute cough may only last 2-3 weeks, while a chronic cough may last 8 or more weeks. Coughing is commonly caused by: Infection of the respiratory systemby viruses or bacteria. Breathing in substances that irritate your lungs. Allergies. Asthma. Mucus that runs down the back of your throat (postnasal drip). Smoking. Acid backing up from the stomach into the esophagus (gastroesophageal reflux). Certain medicines. Chronic lung problems. Other medical conditions such as heart failure or a blood clot in the lung (pulmonary embolism). Follow these instructions at home: Medicines Take over-the-counter and prescription medicines only as told by your health care  provider. Talk with your health care provider before you take a cough suppressant medicine. Lifestyle  Avoid cigarette smoke. Do not use any products that contain nicotine or tobacco, such as cigarettes, e-cigarettes, and chewing tobacco. If you need help quitting, ask your health care provider. Drink enough fluid to keep your urine pale yellow. Avoid caffeine. Do not drink alcohol if your health care provider tells you not to drink. General instructions  Pay close attention to changes in your cough. Tell your health care provider about them. Always cover your mouth when you cough. Avoid things that make you cough, such as perfume, candles, cleaning products, or campfire or tobacco smoke. If the air is dry, use a cool mist vaporizer or humidifier in your bedroom or your home to help loosen secretions. If your cough is worse at night, try to sleep in a semi-upright position. Rest as needed. Keep all follow-up visits as told by your health care provider. This is important. Contact a health care provider if you: Have new symptoms. Cough up pus. Have a cough that does not get better after 2-3 weeks or gets worse. Cannot control your cough with cough suppressant medicines and you are losing sleep. Have pain that gets worse or pain that is not helped with medicine. Have a fever. Have unexplained weight loss. Have night sweats. Get help right away if: You cough up blood. You have difficulty breathing. Your heartbeat is very fast. These symptoms may represent a serious problem that is an emergency. Do not wait to see if the symptoms will go away. Get medical help right away. Call your local emergency services (911 in the [place] Do not drive yourself to the hospital. Summary Coughing is a reflex that clears your throat and your airways. It is normal to cough occasionally, but a cough that happens with other symptoms or lasts a long time may be a sign of a condition that needs treatment. Take  over-the-counter and prescription medicines only as told by your health care provider. Always cover your mouth when you cough. Contact a health care provider if you have new symptoms or a cough that does not get better after 2-3 weeks or gets worse. This information is not intended to replace advice given to you by your health care provider. Make sure you discuss any questions you have with your health care provider. Document Revised: 05/05/2022 Document Reviewed: 12/19/2018 Elsevier Patient Education  [place].   Asthma, Adult  Asthma is a long-term (chronic) condition that causes recurrent episodes in which the lower airways in the lungs become tight and narrow. The narrowing is caused by inflammation and tightening of the smooth muscle around the lower airways. Asthma episodes, also called asthma attacks or asthma flares, may cause coughing, making high-pitched whistling sounds when you breathe, most often when you breathe out (wheezing), shortness of breath, and chest pain. The airways may produce extra mucus caused by the inflammation and irritation. During an attack, it can be difficult to breathe. Asthma attacks can range from minor to life-threatening. Asthma cannot be cured, but medicines and lifestyle changes can help control it and treat acute attacks. It is important to keep your asthma well controlled so the condition does not interfere with your daily life. What are the causes? This condition is believed to be caused by inherited (genetic) and environmental factors, but its exact cause is not known. What can trigger an asthma attack? Many things can bring on an asthma attack or make symptoms worse. These triggers are different for every person. Common triggers include: Allergens and irritants like mold, dust,  pet dander, cockroaches, pollen, air pollution, and chemical odors. Cigarette smoke. Weather changes and cold air. Stress and strong emotional responses such as  crying or laughing hard. Certain medications such as aspirin or beta blockers. Infections and inflammatory conditions, such as the flu, a cold, pneumonia, or inflammation of the nasal membranes (rhinitis). Gastroesophageal reflux disease (GERD). What are the signs or symptoms? Symptoms may occur right after exposure to an asthma trigger or hours later and can vary by person. Common signs and symptoms include: Wheezing. Trouble breathing (shortness of breath). Excessive nighttime or early morning coughing. Chest tightness. Tiredness (fatigue) with minimal activity. Difficulty talking in complete sentences. Poor exercise tolerance. How is this diagnosed? This condition is diagnosed based on: A physical exam and your medical history. Tests, which may include: Lung function studies to evaluate the flow of air in your lungs. Allergy tests. Imaging tests, such as X-rays. How is this treated? There is no cure, but symptoms can be controlled with proper treatment. Treatment usually involves: Identifying and avoiding your asthma triggers. Inhaled medicines. Two types are commonly used to treat asthma, depending on severity: Controller medicines. These help prevent asthma symptoms from occurring. They are taken every day. Fast-acting reliever or rescue medicines. These quickly relieve asthma symptoms. They are used as needed and provide short-term relief. Using other medicines, such as: Allergy medicines, such as antihistamines, if your asthma attacks are triggered by allergens. Immune medicines (immunomodulators). These are medicines that help control the immune system. Using supplemental oxygen. This is only needed during a severe episode. Creating an asthma action plan. An asthma action plan is a written plan for managing and treating your asthma attacks. This plan includes: A list of your asthma triggers and how to avoid them. Information about when medicines should be taken and when their  dosage should be changed. Instructions about using a device called a peak flow meter. A peak flow meter measures how well the lungs are working and the severity of your asthma. It helps you monitor your condition. Follow these instructions at home: Take over-the-counter and prescription medicines only as told by your health care provider. Stay up to date on all vaccinations as recommended by your healthcare provider, including vaccines for the flu and pneumonia. Use a peak flow meter and keep track of your peak flow readings. Understand and use your asthma action plan to address any asthma flares. Do not smoke or allow anyone to smoke in your home. Contact a health care provider if: You have wheezing, shortness of breath, or a cough that is not responding to medicines. Your medicines are causing side effects, such as a rash, itching, swelling, or trouble breathing. You need to use a reliever medicine more than 2-3 times a week. Your peak flow reading is still at 50-79% of your personal best after following your action plan for 1 hour. You have a fever and shortness of breath. Get help right away if: You are getting worse and do not respond to treatment during an asthma attack. You are short of breath when at rest or when doing very little physical activity. You have difficulty eating, drinking, or talking. You have chest pain or tightness. You develop a fast heartbeat or palpitations. You have a bluish color to your lips or fingernails. You are light-headed or dizzy, or you faint. Your peak flow reading is less than 50% of your personal best. You feel too tired to breathe normally. These symptoms may be an emergency. Get help right away.  Call 911. Do not wait to see if the symptoms will go away. Do not drive yourself to the hospital. Summary Asthma is a long-term (chronic) condition that causes recurrent episodes in which the airways become tight and narrow. Asthma episodes, also called  asthma attacks or asthma flares, can cause coughing, wheezing, shortness of breath, and chest pain. Asthma cannot be cured, but medicines and lifestyle changes can help keep it well controlled and prevent asthma flares. Make sure you understand how to avoid triggers and how and when to use your medicines. Asthma attacks can range from minor to life-threatening. Get help right away if you have an asthma attack and do not respond to treatment with your usual rescue medicines. This information is not intended to replace advice given to you by your health care provider. Make sure you discuss any questions you have with your health care provider. Document Revised: 09/17/2021 Document Reviewed: 09/08/2021 Elsevier Patient Education  [place],   Merri Ray, MD [place] 11/16/22 9:26 AM

## 2022-12-16 ENCOUNTER — Other Ambulatory Visit: Payer: Self-pay | Admitting: Family Medicine

## 2022-12-16 DIAGNOSIS — E785 Hyperlipidemia, unspecified: Secondary | ICD-10-CM

## 2022-12-16 DIAGNOSIS — F411 Generalized anxiety disorder: Secondary | ICD-10-CM

## 2022-12-16 DIAGNOSIS — I10 Essential (primary) hypertension: Secondary | ICD-10-CM

## 2023-02-10 ENCOUNTER — Other Ambulatory Visit: Payer: Self-pay | Admitting: Family Medicine

## 2023-02-10 DIAGNOSIS — J45909 Unspecified asthma, uncomplicated: Secondary | ICD-10-CM

## 2023-02-12 ENCOUNTER — Ambulatory Visit: Payer: 59 | Admitting: Family Medicine

## 2023-03-25 ENCOUNTER — Inpatient Hospital Stay: Admission: RE | Admit: 2023-03-25 | Payer: 59 | Source: Ambulatory Visit

## 2023-06-11 ENCOUNTER — Telehealth: Payer: Self-pay | Admitting: Family Medicine

## 2023-06-11 ENCOUNTER — Other Ambulatory Visit: Payer: Self-pay | Admitting: Obstetrics

## 2023-06-11 ENCOUNTER — Other Ambulatory Visit: Payer: Self-pay | Admitting: Family Medicine

## 2023-06-11 DIAGNOSIS — Z0000 Encounter for general adult medical examination without abnormal findings: Secondary | ICD-10-CM

## 2023-06-11 DIAGNOSIS — J45909 Unspecified asthma, uncomplicated: Secondary | ICD-10-CM

## 2023-06-11 NOTE — Telephone Encounter (Addendum)
Caller name: Delanie Wagg  On DPR?: Yes  Call back number: [phone] (mobile)  Provider they see: Shade Flood, MD  Reason for call:   Pt would like BP meds filled. Has been without 2wks I made appt. But then she called back stating she was dizzy RED word. Sent to Triage.  Patient Name First: Nicole Last: RICHARDSO Reyes Gender: Female DOB: 1956/04/06 Age: 67 Y 6 M 29 D Return Phone Number: [phone] (Primary) Address: City/ State/ Zip: [place]  [address] Client [facility] [place] Client Site [facility] Provider Meredith Staggers- MD Contact Type Call Who Is Calling Patient / Member / Family / Caregiver Call Type Triage / Clinical Relationship To Patient Self Return Phone Number [phone] (Primary) Chief Complaint Dizziness Reason for Call Symptomatic / Request for Health Information Initial Comment Caller states she having dizziness due to she is out of blood pressure medication. Translation No Nurse Assessment Nurse: Clarita Leber, RN, Deborah Date/Time (Eastern Time): 06/11/2023 10:38:30 AM Confirm and document reason for call. If symptomatic, describe symptoms. ---The caller states that she is dizzy and she thought it was her sinuses. When it happens when she looks up. Does the patient have any new or worsening symptoms? ---Yes Will a triage be completed? ---Yes Related visit to physician within the last 2 weeks? ---No Does the PT have any chronic conditions? (i.e. diabetes, asthma, this includes High risk factors for pregnancy, etc.) ---Yes List chronic conditions. ---asthma, allergies Is this a behavioral health or substance abuse call? ---No Guidelines Guideline Title Affirmed Question Affirmed Notes Nurse Date/Time (Eastern Time) Dizziness - Lightheadedness [1] MILD dizziness (e.g., walking normally) AND [2] has NOT been evaluated by doctor (or NP/PA) for  this (Exception: Dizziness caused by heat Womble, RN, Gavin Pound 06/11/2023 10:40:07 AM PLEASE NOTE: All timestamps contained within this report are represented as [place] Standard Time. CONFIDENTIALTY NOTICE: This fax transmission is intended only for the addressee. It contains information that is legally privileged, confidential or otherwise protected from use or disclosure. If you are not the intended recipient, you are strictly prohibited from reviewing, disclosing, copying using or disseminating any of this information or taking any action in reliance on or regarding this information. If you have received this fax in error, please notify us immediately by telephone so that we can arrange for its return to [place]. Phone: [phone], Toll-Free: [phone], Fax: [phone] Page: 2 of 2 Call Id: [id number] Guidelines Guideline Title Affirmed Question Affirmed Notes Nurse Date/Time Lamount Cohen Time) exposure, sudden standing, or poor fluid intake.) Disp. Time Lamount Cohen Time) Disposition Final User 06/11/2023 10:27:23 AM Attempt made - no message left Clarita Leber, RN, Gavin Pound 06/11/2023 10:50:32 AM SEE PCP WITHIN 3 DAYS Yes Clarita Leber, RN, Deborah Final Disposition 06/11/2023 10:50:32 AM SEE PCP WITHIN 3 DAYS Yes Womble, RN, Jetty Duhamel Disagree/Comply Comply Caller Understands Yes PreDisposition Call Doctor Care Advice Given Per Guideline SEE PCP WITHIN 3 DAYS: Comments User: Alita Chyle, RN Date/Time (Eastern Time): 06/11/2023 10:42:53 AM Caller states that she lost her job year (lost her insurance) and she hasn't had her medications. Symbicort and Lisinopril 10 mg. Now has health insurance. User: Alita Chyle, RN Date/Time Lamount Cohen Time): 06/11/2023 10:53:31 AM This nurse called both backlines and no answer. This nurse went back to the caller and she states that she just realized that there is a refill on her medications but she didn't know what to do as it was on her old insurance.  This nurse  explained that she just needs to tell them that her insurance has changed and give them the new information and she verbalized understanding. She does have an appointment scheduled for 7/11/ 24

## 2023-06-11 NOTE — Telephone Encounter (Signed)
Pt informed

## 2023-06-11 NOTE — Telephone Encounter (Signed)
Triage note reviewed, including that she does have med refills available.  Please make sure she was able to fill those meds, and if any persistent dizziness back on medication should be evaluated prior to her visit with me on July 11.  If unable to fill the previous medications, okay for temporary refill.  Let me know if there are further questions.

## 2023-06-14 NOTE — Telephone Encounter (Signed)
Patient Name First: Nicole Last: RICHARDSO N Gender: Female DOB: 08/21/1956 Age: 67 Y 6 M 29 D Return Phone Number: [phone] (Primary) Address: City/ State/ Zip: [place]  [address] Client [facility] [place] Client Site [facility] - Day Provider Meredith Staggers- MD Contact Type Call Who Is Calling Patient / Member / Family / Caregiver Call Type Triage / Clinical Relationship To Patient Self Return Phone Number [phone] (Primary) Chief Complaint Dizziness Reason for Call Symptomatic / Request for Health Information Initial Comment Caller states she having dizziness due to she is out of blood pressure medication. Translation No Nurse Assessment Nurse: Clarita Leber, RN, Deborah Date/Time (Eastern Time): 06/11/2023 10:38:30 AM Confirm and document reason for call. If symptomatic, describe symptoms. ---The caller states that she is dizzy and she thought it was her sinuses. When it happens when she looks up. Does the patient have any new or worsening symptoms? ---Yes Will a triage be completed? ---Yes Related visit to physician within the last 2 weeks? ---No Does the PT have any chronic conditions? (i.e. diabetes, asthma, this includes High risk factors for pregnancy, etc.) ---Yes List chronic conditions. ---asthma, allergies Is this a behavioral health or substance abuse call? ---No Guidelines Guideline Title Affirmed Question Affirmed Notes Nurse Date/Time (Eastern Time) Dizziness - Lightheadedness [1] MILD dizziness (e.g., walking normally) AND [2] has NOT been evaluated by doctor (or NP/PA) for this (Exception: Dizziness caused by heat Womble, RN, Gavin Pound 06/11/2023 10:40:07 AM PLEASE NOTE: All timestamps contained within this report are represented as [place] Standard Time. CONFIDENTIALTY NOTICE: This fax transmission is intended only for the addressee. It contains information that is  legally privileged, confidential or otherwise protected from use or disclosure. If you are not the intended recipient, you are strictly prohibited from reviewing, disclosing, copying using or disseminating any of this information or taking any action in reliance on or regarding this information. If you have received this fax in error, please notify us immediately by telephone so that we can arrange for its return to [place]. Phone: [phone], Toll-Free: [phone], Fax: [phone] Page: 2 of 2 Call Id: [id number] Guidelines Guideline Title Affirmed Question Affirmed Notes Nurse Date/Time Lamount Cohen Time) exposure, sudden standing, or poor fluid intake.) Disp. Time Lamount Cohen Time) Disposition Final User 06/11/2023 10:27:23 AM Attempt made - no message left Clarita Leber, RN, Gavin Pound 06/11/2023 10:50:32 AM SEE PCP WITHIN 3 DAYS Yes Clarita Leber, RN, Deborah Final Disposition 06/11/2023 10:50:32 AM SEE PCP WITHIN 3 DAYS Yes Womble, RN, Jetty Duhamel Disagree/Comply Comply Caller Understands Yes PreDisposition Call Doctor Care Advice Given Per Guideline SEE PCP WITHIN 3 DAYS: Comments User: Alita Chyle, RN Date/Time (Eastern Time): 06/11/2023 10:42:53 AM Caller states that she lost her job year (lost her insurance) and she hasn't had her medications. Symbicort and Lisinopril 10 mg. Now has health insurance. User: Alita Chyle, RN Date/Time Lamount Cohen Time): 06/11/2023 10:53:31 AM This nurse called both backlines and no answer. This nurse went back to the caller and she states that she just realized that there is a refill on her medications but she didn't know what to do as it was on her old insurance. This nurse explained that she just needs to tell them that her insurance has changed and give them the new information and she verbalized understanding. She does have an appointment scheduled for 7/11/ 24  Will call Pt today.

## 2023-06-21 ENCOUNTER — Telehealth: Payer: Self-pay | Admitting: Family Medicine

## 2023-06-21 ENCOUNTER — Other Ambulatory Visit: Payer: Self-pay

## 2023-06-21 DIAGNOSIS — J45909 Unspecified asthma, uncomplicated: Secondary | ICD-10-CM

## 2023-06-21 MED ORDER — FLUTICASONE-SALMETEROL 250-50 MCG/ACT IN AEPB: 1.0000 | INHALATION_SPRAY | Freq: Two times a day (BID) | RESPIRATORY_TRACT | 5 refills | Status: DC

## 2023-06-21 NOTE — Telephone Encounter (Signed)
Caller name: Terese Vi  On DPR?: Yes  Call back number: [phone] (mobile)  Provider they see: Shade Flood, MD  Reason for call: Pt called stating she had wrong inhaler prescribed she  would like this one.  fluticasone-salmeterol (ADVAIR) 250-50 MCG/ACT AEPB

## 2023-06-21 NOTE — Telephone Encounter (Signed)
Sent Advair to pharmacy

## 2023-06-24 ENCOUNTER — Ambulatory Visit: Payer: 59 | Admitting: Family Medicine

## 2023-07-01 ENCOUNTER — Ambulatory Visit (INDEPENDENT_AMBULATORY_CARE_PROVIDER_SITE_OTHER): Payer: Medicare HMO | Admitting: Family Medicine

## 2023-07-01 ENCOUNTER — Encounter: Payer: Self-pay | Admitting: Family Medicine

## 2023-07-01 VITALS — BP 130/72 | HR 77 | Temp 98.1°F | Ht 65.0 in | Wt 245.6 lb

## 2023-07-01 DIAGNOSIS — I10 Essential (primary) hypertension: Secondary | ICD-10-CM | POA: Diagnosis not present

## 2023-07-01 DIAGNOSIS — E785 Hyperlipidemia, unspecified: Secondary | ICD-10-CM | POA: Diagnosis not present

## 2023-07-01 DIAGNOSIS — F411 Generalized anxiety disorder: Secondary | ICD-10-CM

## 2023-07-01 DIAGNOSIS — G4700 Insomnia, unspecified: Secondary | ICD-10-CM

## 2023-07-01 DIAGNOSIS — J45909 Unspecified asthma, uncomplicated: Secondary | ICD-10-CM

## 2023-07-01 MED ORDER — SERTRALINE HCL 50 MG PO TABS: 50.0000 mg | ORAL_TABLET | Freq: Every day | ORAL | 1 refills | Status: DC

## 2023-07-01 MED ORDER — AMLODIPINE BESYLATE 10 MG PO TABS: 10.0000 mg | ORAL_TABLET | Freq: Every day | ORAL | 1 refills | Status: DC

## 2023-07-01 MED ORDER — ATORVASTATIN CALCIUM 10 MG PO TABS: 10.0000 mg | ORAL_TABLET | Freq: Every day | ORAL | 1 refills | Status: DC

## 2023-07-01 MED ORDER — LISINOPRIL-HYDROCHLOROTHIAZIDE 20-25 MG PO TABS: 1.0000 | ORAL_TABLET | Freq: Every day | ORAL | 1 refills | Status: DC

## 2023-07-01 MED ORDER — HYDROXYZINE HCL 10 MG PO TABS: 10.0000 mg | ORAL_TABLET | Freq: Every evening | ORAL | 1 refills | Status: DC | PRN

## 2023-07-01 NOTE — Progress Notes (Signed)
Subjective:  Patient ID: Nicole Reyes, female    DOB: Dec 18, 1955  Age: 67 y.o. MRN: [id number]  CC:  Chief Complaint  Patient presents with   Medical Management of Chronic Issues    Pt doing well, FYI stopped atorvastatin due to aches that do go away when off medication    Sleeping Problem    Notes can't fall asleep till late, used to be manageable but has just been getting worse lately     HPI Nicole Reyes presents for  Follow-up, last seen in December for acute illness, September  2023 for physical. Change in insurance.   Hypertension: Prior decreased hypertension control off meds, restarted and improved control at her September visit.  Lisinopril HCTZ 20/25 mg daily. No new side effects.  Home readings: not recently. Ran out few weeks ago, back on past month.   BP Readings from Last 3 Encounters:  07/01/23 130/72  11/16/22 138/72  09/11/22 138/70   Lab Results  Component Value Date   CREATININE 0.80 09/11/2022   Hyperlipidemia: Previously treated with Lipitor 10 mg daily.  Medication nonadherence as well for statin previously.  On meds last September. Stopped medication on her own due to some possible associated myalgias. Stopped a week ago d/t aches in back and hip. Pain resolved off statin. Was taking daily prior.  Lab Results  Component Value Date   CHOL 152 09/11/2022   HDL 44.00 09/11/2022   LDLCALC 92 09/11/2022   TRIG 82.0 09/11/2022   CHOLHDL 3 09/11/2022   Lab Results  Component Value Date   ALT 30 09/11/2022   AST 29 09/11/2022   GGT 45 07/20/2019   ALKPHOS 115 09/11/2022   BILITOT 0.6 09/11/2022   Anxiety Worsening symptoms off sertraline last year, improved after restart of 25 mg when discussed in September. Overall doing ok, but anxious at times, shaky at times few times per week.     07/01/2023    3:15 PM 04/18/2020   10:05 AM 03/28/2020    3:19 PM  GAD 7 : Generalized Anxiety Score  Nervous, Anxious, on Edge 0 0 3  Control/stop  worrying 0 0 0  Worry too much - different things 0 0 3  Trouble relaxing 0 2 3  Restless 0 0 0  Easily annoyed or irritable 0 0 0  Afraid - awful might happen 0 0 0  Total GAD 7 Score 0 2 9  Anxiety Difficulty   Somewhat difficult   Asthma History of medication nonadherence, but Advair had better coverage than previous Symbicort.  Was using that to twice daily at her physical in September, still using albuterol few times per week. Also with history of allergies, treated with Flonase - working well.  Using advair consistently. No recent need for albuterol   Insomnia Melatonin discussed at her physical last year.  Handout given on insomnia. Tried melatonin - no relief.  Trouble getting to sleep. Some daytime naps.  No recent use of hydroxyzine - helped in past.   Eye surgery went well. Seeing well without glasses.      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   Acute otitis media 01/08/2019   Sore throat 01/08/2019   Osteoarthritis of right knee 08/31/2018   Right knee DJD 08/31/2018   Allergic reaction 05/23/2018   Allergic urticaria 05/23/2018   Perennial and seasonal allergic rhinitis 05/23/2018   Allergic conjunctivitis 05/23/2018   Food allergy 05/23/2018   Carpal  tunnel syndrome of left wrist 01/25/2018   Lattice degeneration 11/20/2017   Dry eye syndrome 11/20/2017   Cataracts, bilateral 11/20/2017   Chronic right SI joint pain 01/28/2016   Nut allergy 01/28/2016   Obesity 01/25/2015   Pure hypercholesterolemia 02/10/2014   Moderate persistent asthma 12/19/2012   Hypertensive disorder 12/19/2012   Past Medical History:  Diagnosis Date   Allergy    Anxiety    Arthritis    Asthma    Glaucoma    Hypertension    Past Surgical History:  Procedure Laterality Date   COLONOSCOPY  06/29/2011   Normal   COLONOSCOPY&ENDOSCOPY  08/15/1979   PT UNSURE PLACE&MD WHO DID EXAMS=NORMAL   DILATION AND CURETTAGE OF UTERUS      LAPAROSCOPY     REMOVED SCARE TISSUE FROM OVARIES   ROTATOR CUFF REPAIR  04/24/2022   TOTAL KNEE ARTHROPLASTY Right 08/31/2018   Procedure: RIGHT  TOTAL KNEE ARTHROPLASTY;  Surgeon: Jene Every, MD;  Location: [facility];  Service: Orthopedics;  Laterality: Right;   TOTAL KNEE ARTHROPLASTY Left 10/02/2020   Procedure: TOTAL KNEE ARTHROPLASTY;  Surgeon: Jene Every, MD;  Location: [facility];  Service: Orthopedics;  Laterality: Left;  2.5 hrs   Allergies  Allergen Reactions   Peanut-Containing Drug Products Anaphylaxis    Also walnuts and almonds    Shellfish Allergy Hives   Cephalexin Rash   Prior to Admission medications   Medication Sig Start Date End Date Taking? Authorizing Provider  albuterol (VENTOLIN HFA) 108 (90 Base) MCG/ACT inhaler INHALE 2 PUFFS BY MOUTH EVERY 6 HOURS AS NEEDED FOR WHEEZING 06/11/23  Yes Shade Flood, MD  amLODipine (NORVASC) 10 MG tablet TAKE 1 TABLET BY MOUTH EVERY DAY 12/16/22  Yes Shade Flood, MD  aspirin EC 81 MG tablet Take 1 tablet (81 mg total) by mouth in the morning and at bedtime. Swallow whole. 10/04/20  Yes Andrez Grime M, PA-C  cholecalciferol (VITAMIN D3) 25 MCG (1000 UNIT) tablet Take 1,000 Units by mouth daily.   Yes [provider]  EPINEPHrine 0.3 mg/0.3 mL IJ SOAJ injection Inject 0.3 mLs (0.3 mg total) into the muscle as needed for anaphylaxis. 03/28/20  Yes Shade Flood, MD  fluticasone [facility]) 50 MCG/ACT nasal spray PLACE 2 SPRAYS INTO BOTH NOSTRILS DAILY AS NEEDED (ALLERGIES.). 12/16/22  Yes Shade Flood, MD  fluticasone-salmeterol (ADVAIR) 250-50 MCG/ACT AEPB Inhale 1 puff into the lungs in the morning and at bedtime. 06/21/23  Yes Shade Flood, MD  lisinopril-hydrochlorothiazide (ZESTORETIC) 20-25 MG tablet TAKE 1 TABLET BY MOUTH EVERY DAY 12/16/22  Yes Shade Flood, MD  predniSONE (DELTASONE) 20 MG tablet Take 2 tablets (40 mg total) by mouth daily with breakfast. 11/16/22  Yes Shade Flood, MD   sertraline (ZOLOFT) 25 MG tablet TAKE 1 TABLET (25 MG TOTAL) BY MOUTH DAILY. 12/16/22  Yes Shade Flood, MD  atorvastatin (LIPITOR) 10 MG tablet TAKE 1 TABLET BY MOUTH EVERY DAY Patient not taking: Reported on 07/01/2023 12/16/22   Shade Flood, MD   Social History   Socioeconomic History   Marital status: Single    Spouse name: n/a   Number of children: 0   Years of education: Not on file   Highest education level: Not on file  Occupational History   Occupation: employed    Employer: [employer]  Tobacco Use   Smoking status: Never   Smokeless tobacco: Never  Vaping Use   Vaping status: Never Used  Substance and  Sexual Activity   Alcohol use: No    Alcohol/week: 0.0 standard drinks of alcohol   Drug use: No   Sexual activity: Yes    Birth control/protection: Post-menopausal  Other Topics Concern   Not on file  Social History Narrative   Marital status:  Single; not dating      Children: none      Lives: alone      Employment:  Copywriter, [employer]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      Tobacco: none      Alcohol: none      Exercise:  Four days per week.   Social Determinants of Health   Financial Resource Strain: Not on file  Food Insecurity: Not on file  Transportation Needs: Not on file  Physical Activity: Not on file  Stress: Not on file  Social Connections: Not on file  Intimate Partner Violence: Not on file    Review of Systems  Constitutional:  Negative for fatigue and unexpected weight change.  Respiratory:  Negative for chest tightness and shortness of breath.   Cardiovascular:  Negative for chest pain, palpitations and leg swelling.  Gastrointestinal:  Negative for abdominal pain and blood in stool.  Neurological:  Negative for dizziness, syncope, light-headedness and headaches.     Objective:   Vitals:   07/01/23 1515  BP: 130/72  Pulse: 77  Temp: 98.1 F (36.7 C)  TempSrc: Temporal  SpO2: 97%  Weight: 245 lb 9.6 oz (111.4 kg)  Height: 5\' 5"  (1.651 m)      Physical Exam Vitals reviewed.  Constitutional:      Appearance: Normal appearance. She is well-developed.  HENT:     Head: Normocephalic and atraumatic.  Eyes:     Conjunctiva/sclera: Conjunctivae normal.     Pupils: Pupils are equal, round, and reactive to light.  Neck:     Vascular: No carotid bruit.  Cardiovascular:     Rate and Rhythm: Normal rate and regular rhythm.     Heart sounds: Normal heart sounds.  Pulmonary:     Effort: Pulmonary effort is normal.     Breath sounds: Normal breath sounds.  Abdominal:     Palpations: Abdomen is soft. There is no pulsatile mass.     Tenderness: There is no abdominal tenderness.  Musculoskeletal:     Right lower leg: No edema.     Left lower leg: No edema.  Skin:    General: Skin is warm and dry.  Neurological:     Mental Status: She is alert and oriented to person, place, and time.  Psychiatric:        Mood and Affect: Mood normal.        Behavior: Behavior normal.        Assessment & Plan:  Nicole Reyes is a 67 y.o. female . Extrinsic asthma without complication, unspecified asthma severity, unspecified whether persistent  -Stable with Advair, continue same, has albuterol if needed.  Continue allergy treatment with Flonase as needed with RTC precautions if increased albuterol need.  Essential hypertension - Plan: amLODipine (NORVASC) 10 MG tablet, lisinopril-hydrochlorothiazide (ZESTORETIC) 20-25 MG tablet  -Stable, tolerating current dose of meds.  No med changes, check labs with adjustment of plan accordingly.  Generalized anxiety disorder - Plan: sertraline (ZOLOFT) 50 MG tablet  -Fair control with some breakthrough symptoms and may be component of insomnia.  Trial of higher dose of sertraline at 50 mg daily, potential side effects discussed, take with food for increased bioavailability.  Recheck 1 month  Hyperlipidemia, unspecified hyperlipidemia type - Plan: atorvastatin (LIPITOR) 10 MG tablet,  Comprehensive metabolic panel, Lipid panel  -Question if previous arthralgias were truly due to statin, possible coincidental improvement as she had tolerated previously.  Restart atorvastatin once weekly then increase by 1 dose per week back towards daily dosing if tolerated, or intermittent dosing if return if arthralgias/myalgias.  Insomnia, unspecified type - Plan: hydrOXYzine (ATARAX) 10 MG tablet  -Sleep hygiene discussed, try to minimize naps as improved sleep at night.  Short-term hydroxyzine with potential side effects discussed but has tolerated previously.  Higher dose sertraline as above, recheck 1 month.  Meds ordered this encounter  Medications   amLODipine (NORVASC) 10 MG tablet    Sig: Take 1 tablet (10 mg total) by mouth daily.    Dispense:  90 tablet    Refill:  1   lisinopril-hydrochlorothiazide (ZESTORETIC) 20-25 MG tablet    Sig: Take 1 tablet by mouth daily.    Dispense:  90 tablet    Refill:  1   sertraline (ZOLOFT) 50 MG tablet    Sig: Take 1 tablet (50 mg total) by mouth daily. With food.    Dispense:  90 tablet    Refill:  1   atorvastatin (LIPITOR) 10 MG tablet    Sig: Take 1 tablet (10 mg total) by mouth daily. Can restart once per week, increase as tolerated to daily.    Dispense:  90 tablet    Refill:  1   hydrOXYzine (ATARAX) 10 MG tablet    Sig: Take 1-2 tablets (10-20 mg total) by mouth at bedtime as needed.    Dispense:  30 tablet    Refill:  1   Patient Instructions  No change in blood pressure medications today.  I will check some labs and let you know if there are any concerns.  I will check your cholesterol levels today, but for now try taking the atorvastatin 1 pill once per week.  If you tolerate that dose then can increase by 1 dose every week until you are back to daily treatment or if you have any muscle aches.  If you do have muscle aches return to the previous dose that you tolerated and let me know what dose is tolerated.   Increase the  sertraline, and hydroxyzine for sleep if needed. See other sleep hygiene below.  Avoid caffeine in the afternoon, exercise during the day can be helpful.  Try reading a book if you are unable to sleep but try to avoid any electronic media such as phone or TV when in bed.  Recheck in 1 month.  We can discuss other options at that time if needed. Try to decrease naps during the day as possible as well. Hang in there!  Insomnia Insomnia is a sleep disorder that makes it difficult to fall asleep or stay asleep. Insomnia can cause fatigue, low energy, difficulty concentrating, mood swings, and poor performance at work or school. There are three different ways to classify insomnia: Difficulty falling asleep. Difficulty staying asleep. Waking up too early in the morning. Any type of insomnia can be long-term (chronic) or short-term (acute). Both are common. Short-term insomnia usually lasts for 3 months or less. Chronic insomnia occurs at least three times a week for longer than 3 months. What are the causes? Insomnia may be caused by another condition, situation, or substance, such as: Having certain mental health conditions, such as anxiety and depression. Using caffeine, alcohol, tobacco, or drugs. Having gastrointestinal conditions,  such as gastroesophageal reflux disease (GERD). Having certain medical conditions. These include: Asthma. Alzheimer's disease. Stroke. Chronic pain. An overactive thyroid gland (hyperthyroidism). Other sleep disorders, such as restless legs syndrome and sleep apnea. Menopause. Sometimes, the cause of insomnia may not be known. What increases the risk? Risk factors for insomnia include: Gender. Females are affected more often than males. Age. Insomnia is more common as people get older. Stress and certain medical and mental health conditions. Lack of exercise. Having an irregular work schedule. This may include working night shifts and traveling between different  time zones. What are the signs or symptoms? If you have insomnia, the main symptom is having trouble falling asleep or having trouble staying asleep. This may lead to other symptoms, such as: Feeling tired or having low energy. Feeling nervous about going to sleep. Not feeling rested in the morning. Having trouble concentrating. Feeling irritable, anxious, or depressed. How is this diagnosed? This condition may be diagnosed based on: Your symptoms and medical history. Your health care provider may ask about: Your sleep habits. Any medical conditions you have. Your mental health. A physical exam. How is this treated? Treatment for insomnia depends on the cause. Treatment may focus on treating an underlying condition that is causing the insomnia. Treatment may also include: Medicines to help you sleep. Counseling or therapy. Lifestyle adjustments to help you sleep better. Follow these instructions at home: Eating and drinking  Limit or avoid alcohol, caffeinated beverages, and products that contain nicotine and tobacco, especially close to bedtime. These can disrupt your sleep. Do not eat a large meal or eat spicy foods right before bedtime. This can lead to digestive discomfort that can make it hard for you to sleep. Sleep habits  Keep a sleep diary to help you and your health care provider figure out what could be causing your insomnia. Write down: When you sleep. When you wake up during the night. How well you sleep and how rested you feel the next day. Any side effects of medicines you are taking. What you eat and drink. Make your bedroom a dark, comfortable place where it is easy to fall asleep. Put up shades or blackout curtains to block light from outside. Use a white noise machine to block noise. Keep the temperature cool. Limit screen use before bedtime. This includes: Not watching TV. Not using your smartphone, tablet, or computer. Stick to a routine that includes  going to bed and waking up at the same times every day and night. This can help you fall asleep faster. Consider making a quiet activity, such as reading, part of your nighttime routine. Try to avoid taking naps during the day so that you sleep better at night. Get out of bed if you are still awake after 15 minutes of trying to sleep. Keep the lights down, but try reading or doing a quiet activity. When you feel sleepy, go back to bed. General instructions Take over-the-counter and prescription medicines only as told by your health care provider. Exercise regularly as told by your health care provider. However, avoid exercising in the hours right before bedtime. Use relaxation techniques to manage stress. Ask your health care provider to suggest some techniques that may work well for you. These may include: Breathing exercises. Routines to release muscle tension. Visualizing peaceful scenes. Make sure that you drive carefully. Do not drive if you feel very sleepy. Keep all follow-up visits. This is important. Contact a health care provider if: You are tired throughout the  day. You have trouble in your daily routine due to sleepiness. You continue to have sleep problems, or your sleep problems get worse. Get help right away if: You have thoughts about hurting yourself or someone else. Get help right away if you feel like you may hurt yourself or others, or have thoughts about taking your own life. Go to your nearest emergency room or: Call 911. Call the National Suicide Prevention Lifeline at 339-535-0244 or 988. This is open 24 hours a day. Text the Crisis Text Line at (408) 794-3997. Summary Insomnia is a sleep disorder that makes it difficult to fall asleep or stay asleep. Insomnia can be long-term (chronic) or short-term (acute). Treatment for insomnia depends on the cause. Treatment may focus on treating an underlying condition that is causing the insomnia. Keep a sleep diary to help you and  your health care provider figure out what could be causing your insomnia. This information is not intended to replace advice given to you by your health care provider. Make sure you discuss any questions you have with your health care provider. Document Revised: 11/10/2021 Document Reviewed: 11/10/2021 Elsevier Patient Education  2024 [employer]     Signed,   Meredith Staggers, MD [facility], [facility] 07/01/23 3:30 PM

## 2023-07-01 NOTE — Patient Instructions (Addendum)
No change in blood pressure medications today.  I will check some labs and let you know if there are any concerns.  I will check your cholesterol levels today, but for now try taking the atorvastatin 1 pill once per week.  If you tolerate that dose then can increase by 1 dose every week until you are back to daily treatment or if you have any muscle aches.  If you do have muscle aches return to the previous dose that you tolerated and let me know what dose is tolerated.   Increase the sertraline, and hydroxyzine for sleep if needed. See other sleep hygiene below.  Avoid caffeine in the afternoon, exercise during the day can be helpful.  Try reading a book if you are unable to sleep but try to avoid any electronic media such as phone or TV when in bed.  Recheck in 1 month.  We can discuss other options at that time if needed. Try to decrease naps during the day as possible as well. Hang in there!  Insomnia Insomnia is a sleep disorder that makes it difficult to fall asleep or stay asleep. Insomnia can cause fatigue, low energy, difficulty concentrating, mood swings, and poor performance at work or school. There are three different ways to classify insomnia: Difficulty falling asleep. Difficulty staying asleep. Waking up too early in the morning. Any type of insomnia can be long-term (chronic) or short-term (acute). Both are common. Short-term insomnia usually lasts for 3 months or less. Chronic insomnia occurs at least three times a week for longer than 3 months. What are the causes? Insomnia may be caused by another condition, situation, or substance, such as: Having certain mental health conditions, such as anxiety and depression. Using caffeine, alcohol, tobacco, or drugs. Having gastrointestinal conditions, such as gastroesophageal reflux disease (GERD). Having certain medical conditions. These include: Asthma. Alzheimer's disease. Stroke. Chronic pain. An overactive thyroid gland  (hyperthyroidism). Other sleep disorders, such as restless legs syndrome and sleep apnea. Menopause. Sometimes, the cause of insomnia may not be known. What increases the risk? Risk factors for insomnia include: Gender. Females are affected more often than males. Age. Insomnia is more common as people get older. Stress and certain medical and mental health conditions. Lack of exercise. Having an irregular work schedule. This may include working night shifts and traveling between different time zones. What are the signs or symptoms? If you have insomnia, the main symptom is having trouble falling asleep or having trouble staying asleep. This may lead to other symptoms, such as: Feeling tired or having low energy. Feeling nervous about going to sleep. Not feeling rested in the morning. Having trouble concentrating. Feeling irritable, anxious, or depressed. How is this diagnosed? This condition may be diagnosed based on: Your symptoms and medical history. Your health care provider may ask about: Your sleep habits. Any medical conditions you have. Your mental health. A physical exam. How is this treated? Treatment for insomnia depends on the cause. Treatment may focus on treating an underlying condition that is causing the insomnia. Treatment may also include: Medicines to help you sleep. Counseling or therapy. Lifestyle adjustments to help you sleep better. Follow these instructions at home: Eating and drinking  Limit or avoid alcohol, caffeinated beverages, and products that contain nicotine and tobacco, especially close to bedtime. These can disrupt your sleep. Do not eat a large meal or eat spicy foods right before bedtime. This can lead to digestive discomfort that can make it hard for you to sleep.  Sleep habits  Keep a sleep diary to help you and your health care provider figure out what could be causing your insomnia. Write down: When you sleep. When you wake up during the  night. How well you sleep and how rested you feel the next day. Any side effects of medicines you are taking. What you eat and drink. Make your bedroom a dark, comfortable place where it is easy to fall asleep. Put up shades or blackout curtains to block light from outside. Use a white noise machine to block noise. Keep the temperature cool. Limit screen use before bedtime. This includes: Not watching TV. Not using your smartphone, tablet, or computer. Stick to a routine that includes going to bed and waking up at the same times every day and night. This can help you fall asleep faster. Consider making a quiet activity, such as reading, part of your nighttime routine. Try to avoid taking naps during the day so that you sleep better at night. Get out of bed if you are still awake after 15 minutes of trying to sleep. Keep the lights down, but try reading or doing a quiet activity. When you feel sleepy, go back to bed. General instructions Take over-the-counter and prescription medicines only as told by your health care provider. Exercise regularly as told by your health care provider. However, avoid exercising in the hours right before bedtime. Use relaxation techniques to manage stress. Ask your health care provider to suggest some techniques that may work well for you. These may include: Breathing exercises. Routines to release muscle tension. Visualizing peaceful scenes. Make sure that you drive carefully. Do not drive if you feel very sleepy. Keep all follow-up visits. This is important. Contact a health care provider if: You are tired throughout the day. You have trouble in your daily routine due to sleepiness. You continue to have sleep problems, or your sleep problems get worse. Get help right away if: You have thoughts about hurting yourself or someone else. Get help right away if you feel like you may hurt yourself or others, or have thoughts about taking your own life. Go to your  nearest emergency room or: Call 911. Call the National Suicide Prevention Lifeline at 7021799096 or 988. This is open 24 hours a day. Text the Crisis Text Line at (519)343-3443. Summary Insomnia is a sleep disorder that makes it difficult to fall asleep or stay asleep. Insomnia can be long-term (chronic) or short-term (acute). Treatment for insomnia depends on the cause. Treatment may focus on treating an underlying condition that is causing the insomnia. Keep a sleep diary to help you and your health care provider figure out what could be causing your insomnia. This information is not intended to replace advice given to you by your health care provider. Make sure you discuss any questions you have with your health care provider. Document Revised: 11/10/2021 Document Reviewed: 11/10/2021 Elsevier Patient Education  2024 [employer].

## 2023-07-02 LAB — LIPID PANEL
Cholesterol: 200 mg/dL (ref 0–200)
HDL: 44.8 mg/dL (ref >39.00)
LDL Cholesterol: 131 mg/dL — ABNORMAL HIGH (ref 0–99)
NonHDL: 155.48
Total CHOL/HDL Ratio: 4
Triglycerides: 120 mg/dL (ref 0.0–149.0)
VLDL: 24 mg/dL (ref 0.0–40.0)

## 2023-07-02 LAB — COMPREHENSIVE METABOLIC PANEL
ALT: 28 U/L (ref 0–35)
AST: 31 U/L (ref 0–37)
Albumin: 4.6 g/dL (ref 3.5–5.2)
Alkaline Phosphatase: 92 U/L (ref 39–117)
BUN: 15 mg/dL (ref 6–23)
CO2: 27 mEq/L (ref 19–32)
Calcium: 10.2 mg/dL (ref 8.4–10.5)
Chloride: 103 mEq/L (ref 96–112)
Creatinine, Ser: 0.86 mg/dL (ref 0.40–1.20)
GFR: 70.29 mL/min (ref >60.00)
Glucose, Bld: 105 mg/dL — ABNORMAL HIGH (ref 70–99)
Potassium: 3.7 mEq/L (ref 3.5–5.1)
Sodium: 139 mEq/L (ref 135–145)
Total Bilirubin: 0.6 mg/dL (ref 0.2–1.2)
Total Protein: 7.8 g/dL (ref 6.0–8.3)

## 2023-07-09 ENCOUNTER — Other Ambulatory Visit: Payer: Self-pay | Admitting: Family Medicine

## 2023-07-09 DIAGNOSIS — G4700 Insomnia, unspecified: Secondary | ICD-10-CM

## 2023-07-13 ENCOUNTER — Telehealth: Payer: Self-pay

## 2023-07-13 NOTE — Telephone Encounter (Signed)
LAB RESULT: Blood sugar a few points elevated but overall electrolytes looked okay.  Cholesterol levels were slightly elevated, higher than previous readings.  Restarting Lipitor even intermittently may be helpful.  Let me know if there are questions or concerns with that medication, and let me know what dose you end up  tolerating.  Let me know if there are questions.   Dr. Neva Seat

## 2023-07-13 NOTE — Telephone Encounter (Signed)
Left vm to call office

## 2023-07-13 NOTE — Telephone Encounter (Signed)
-----   Message from Shade Flood sent at 07/12/2023 10:44 PM EDT ----- Results sent by MyChart, but appears patient has not yet reviewed those results.  Please call and make sure they have either seen note or discuss result note.  Thanks.

## 2023-07-14 ENCOUNTER — Encounter (INDEPENDENT_AMBULATORY_CARE_PROVIDER_SITE_OTHER): Payer: Self-pay

## 2023-07-14 NOTE — Telephone Encounter (Signed)
Pt informed

## 2023-07-16 ENCOUNTER — Telehealth: Payer: Self-pay | Admitting: Family Medicine

## 2023-07-16 NOTE — Telephone Encounter (Signed)
Called patient to discuss medication, no answer LM.

## 2023-07-16 NOTE — Telephone Encounter (Signed)
Pt reports consistent headache since starting lipator, is this an expected side effect or other concern? Please advise.

## 2023-07-16 NOTE — Telephone Encounter (Signed)
There are multiple possible causes of headache.  If she has noticed that strictly with use of Lipitor, can try coming off of that temporarily and if headache does not improve then follow-up or be seen to evaluate the headache, especially if any new or worsening symptoms.  If headaches resolve off Lipitor, let me know and we can decide on other medication or change in dosing/dosing interval.

## 2023-07-16 NOTE — Telephone Encounter (Signed)
Caller name: Ceclia Koker  On DPR?: Yes  Call back number: [phone] (mobile)  Provider they see: Shade Flood, MD  Reason for call:  Pt states that she was previously having headaches. She says she is having headaches all the time since she started taking the Lipitor. She tried to take Aspirin but was to know if this could be a side effect of the Rx and what else can she take to improve the headaches.

## 2023-07-19 NOTE — Telephone Encounter (Signed)
Called again no answer,

## 2023-07-20 DIAGNOSIS — Z01419 Encounter for gynecological examination (general) (routine) without abnormal findings: Secondary | ICD-10-CM | POA: Diagnosis not present

## 2023-07-20 DIAGNOSIS — Z1231 Encounter for screening mammogram for malignant neoplasm of breast: Secondary | ICD-10-CM | POA: Diagnosis not present

## 2023-07-20 NOTE — Telephone Encounter (Signed)
Discussed with patient, headaches went away and has not had an issue since, pt declined need for appointment at this time

## 2023-07-20 NOTE — Telephone Encounter (Signed)
Caller name: Gao Blauer  On DPR?: Yes  Call back number: [phone] (mobile)  Provider they see: Shade Flood, MD  Reason for call: Pt returned voicemail regarding medications. She asked for a return call

## 2023-07-30 ENCOUNTER — Ambulatory Visit: Payer: 59

## 2023-07-30 ENCOUNTER — Ambulatory Visit
Admission: RE | Admit: 2023-07-30 | Discharge: 2023-07-30 | Disposition: A | Discharge status: Home / Self Care | Payer: Medicare HMO | Source: Ambulatory Visit | Attending: Obstetrics | Admitting: Obstetrics

## 2023-07-30 DIAGNOSIS — Z0000 Encounter for general adult medical examination without abnormal findings: Secondary | ICD-10-CM

## 2023-07-30 DIAGNOSIS — Z1231 Encounter for screening mammogram for malignant neoplasm of breast: Secondary | ICD-10-CM | POA: Diagnosis not present

## 2023-08-02 ENCOUNTER — Ambulatory Visit (INDEPENDENT_AMBULATORY_CARE_PROVIDER_SITE_OTHER): Payer: Medicare HMO | Admitting: Family Medicine

## 2023-08-02 ENCOUNTER — Encounter: Payer: Self-pay | Admitting: Family Medicine

## 2023-08-02 VITALS — BP 130/74 | HR 67 | Temp 97.9°F | Ht 65.0 in | Wt 242.0 lb

## 2023-08-02 DIAGNOSIS — F411 Generalized anxiety disorder: Secondary | ICD-10-CM | POA: Diagnosis not present

## 2023-08-02 DIAGNOSIS — G4700 Insomnia, unspecified: Secondary | ICD-10-CM

## 2023-08-02 DIAGNOSIS — E785 Hyperlipidemia, unspecified: Secondary | ICD-10-CM

## 2023-08-02 MED ORDER — HYDROXYZINE PAMOATE 25 MG PO CAPS
25.0000 mg | ORAL_CAPSULE | Freq: Three times a day (TID) | ORAL | 3 refills | Status: AC | PRN
Start: 2023-08-02 — End: ?

## 2023-08-02 NOTE — Patient Instructions (Addendum)
For cholesterol - continue lipitor daily. Lab visit in 1 month for repeat cholesterol.   Restart exercise during the day - something you enjoy. This along with the slight higher dose of  hydroxyzine (25mg ) should help sleep. Let me know if not.   Insomnia Insomnia is a sleep disorder that makes it difficult to fall asleep or stay asleep. Insomnia can cause fatigue, low energy, difficulty concentrating, mood swings, and poor performance at work or school. There are three different ways to classify insomnia: Difficulty falling asleep. Difficulty staying asleep. Waking up too early in the morning. Any type of insomnia can be long-term (chronic) or short-term (acute). Both are common. Short-term insomnia usually lasts for 3 months or less. Chronic insomnia occurs at least three times a week for longer than 3 months. What are the causes? Insomnia may be caused by another condition, situation, or substance, such as: Having certain mental health conditions, such as anxiety and depression. Using caffeine, alcohol, tobacco, or drugs. Having gastrointestinal conditions, such as gastroesophageal reflux disease (GERD). Having certain medical conditions. These include: Asthma. Alzheimer's disease. Stroke. Chronic pain. An overactive thyroid gland (hyperthyroidism). Other sleep disorders, such as restless legs syndrome and sleep apnea. Menopause. Sometimes, the cause of insomnia may not be known. What increases the risk? Risk factors for insomnia include: Gender. Females are affected more often than males. Age. Insomnia is more common as people get older. Stress and certain medical and mental health conditions. Lack of exercise. Having an irregular work schedule. This may include working night shifts and traveling between different time zones. What are the signs or symptoms? If you have insomnia, the main symptom is having trouble falling asleep or having trouble staying asleep. This may lead to  other symptoms, such as: Feeling tired or having low energy. Feeling nervous about going to sleep. Not feeling rested in the morning. Having trouble concentrating. Feeling irritable, anxious, or depressed. How is this diagnosed? This condition may be diagnosed based on: Your symptoms and medical history. Your health care provider may ask about: Your sleep habits. Any medical conditions you have. Your mental health. A physical exam. How is this treated? Treatment for insomnia depends on the cause. Treatment may focus on treating an underlying condition that is causing the insomnia. Treatment may also include: Medicines to help you sleep. Counseling or therapy. Lifestyle adjustments to help you sleep better. Follow these instructions at home: Eating and drinking  Limit or avoid alcohol, caffeinated beverages, and products that contain nicotine and tobacco, especially close to bedtime. These can disrupt your sleep. Do not eat a large meal or eat spicy foods right before bedtime. This can lead to digestive discomfort that can make it hard for you to sleep. Sleep habits  Keep a sleep diary to help you and your health care provider figure out what could be causing your insomnia. Write down: When you sleep. When you wake up during the night. How well you sleep and how rested you feel the next day. Any side effects of medicines you are taking. What you eat and drink. Make your bedroom a dark, comfortable place where it is easy to fall asleep. Put up shades or blackout curtains to block light from outside. Use a white noise machine to block noise. Keep the temperature cool. Limit screen use before bedtime. This includes: Not watching TV. Not using your smartphone, tablet, or computer. Stick to a routine that includes going to bed and waking up at the same times every day and  night. This can help you fall asleep faster. Consider making a quiet activity, such as reading, part of your  nighttime routine. Try to avoid taking naps during the day so that you sleep better at night. Get out of bed if you are still awake after 15 minutes of trying to sleep. Keep the lights down, but try reading or doing a quiet activity. When you feel sleepy, go back to bed. General instructions Take over-the-counter and prescription medicines only as told by your health care provider. Exercise regularly as told by your health care provider. However, avoid exercising in the hours right before bedtime. Use relaxation techniques to manage stress. Ask your health care provider to suggest some techniques that may work well for you. These may include: Breathing exercises. Routines to release muscle tension. Visualizing peaceful scenes. Make sure that you drive carefully. Do not drive if you feel very sleepy. Keep all follow-up visits. This is important. Contact a health care provider if: You are tired throughout the day. You have trouble in your daily routine due to sleepiness. You continue to have sleep problems, or your sleep problems get worse. Get help right away if: You have thoughts about hurting yourself or someone else. Get help right away if you feel like you may hurt yourself or others, or have thoughts about taking your own life. Go to your nearest emergency room or: Call 911. Call the National Suicide Prevention Lifeline at 778-052-9758 or 988. This is open 24 hours a day. Text the Crisis Text Line at 913-767-1740. Summary Insomnia is a sleep disorder that makes it difficult to fall asleep or stay asleep. Insomnia can be long-term (chronic) or short-term (acute). Treatment for insomnia depends on the cause. Treatment may focus on treating an underlying condition that is causing the insomnia. Keep a sleep diary to help you and your health care provider figure out what could be causing your insomnia. This information is not intended to replace advice given to you by your health care provider.  Make sure you discuss any questions you have with your health care provider. Document Revised: 11/10/2021 Document Reviewed: 11/10/2021 Elsevier Patient Education  2024 [employer].

## 2023-08-02 NOTE — Progress Notes (Signed)
Subjective:  Patient ID: Nicole Reyes, female    DOB: 1956-07-06  Age: 67 y.o. MRN: [id number]  CC:  Chief Complaint  Patient presents with   Medical Management of Chronic Issues    Notes insomnia has remained about the same since last visit, notes she tried the hydroxyzine and still had difficulty     HPI Nicole Reyes presents for   Insomnia, anxiety With history of anxiety.  Worsening symptoms in the past off sertraline, restarted 25 mg in September last year.  Hydroxyzine had helped in the past, no relief with melatonin.  Hydroxyzine reordered last visit in July.  We also increase her sertraline to 50 mg daily, with taken with food for increased bioavailability. Headaches have improved, anxiety improved, no side effects of higher dose sertraline.  Tried hydroxyzine 20mg  first 2 times - Helped some, better than 10mg . Still trouble getting to sleep for an hour or two. Tried to change sleep hygiene.  Still thinking about day, work.  No regular exercise. Slept better when exercising.   Hyperlipidemia With arthralgias, discussed July 18.  She is self discontinued statin with pain resolution off statin.  Had been on daily dosing prior.  Recommended trying once per week then slowly increase to tolerated dose. She restarted lipitor daily for improved cholesterol control  No daily aches - only occasional soreness in back.  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   Acute otitis media 01/08/2019   Sore throat 01/08/2019   Osteoarthritis of right knee 08/31/2018   Right knee DJD 08/31/2018   Allergic reaction 05/23/2018   Allergic urticaria 05/23/2018   Perennial and seasonal allergic rhinitis 05/23/2018   Allergic conjunctivitis 05/23/2018   Food allergy  05/23/2018   Carpal tunnel syndrome of left wrist 01/25/2018   Lattice degeneration 11/20/2017   Dry eye syndrome 11/20/2017   Cataracts, bilateral 11/20/2017   Chronic right SI joint pain 01/28/2016   Nut allergy 01/28/2016   Obesity 01/25/2015   Pure hypercholesterolemia 02/10/2014   Moderate persistent asthma 12/19/2012   Hypertensive disorder 12/19/2012   Past Medical History:  Diagnosis Date   Allergy    Anxiety    Arthritis    Asthma    Glaucoma    Hypertension    Past Surgical History:  Procedure Laterality Date   COLONOSCOPY  06/29/2011   Normal   COLONOSCOPY&ENDOSCOPY  08/15/1979   PT UNSURE PLACE&MD WHO DID EXAMS=NORMAL   DILATION AND CURETTAGE OF UTERUS     LAPAROSCOPY     REMOVED SCARE TISSUE FROM OVARIES   ROTATOR CUFF REPAIR  04/24/2022   TOTAL KNEE ARTHROPLASTY Right 08/31/2018   Procedure: RIGHT  TOTAL KNEE ARTHROPLASTY;  Surgeon: Jene Every, MD;  Location: [facility];  Service: Orthopedics;  Laterality: Right;   TOTAL KNEE ARTHROPLASTY Left 10/02/2020   Procedure: TOTAL KNEE ARTHROPLASTY;  Surgeon: Jene Every, MD;  Location: [facility];  Service: Orthopedics;  Laterality: Left;  2.5 hrs   Allergies  Allergen Reactions   Peanut-Containing Drug Products Anaphylaxis    Also walnuts and almonds    Shellfish Allergy Hives   Cephalexin Rash   Prior to Admission medications   Medication Sig Start Date End Date Taking? Authorizing Provider  albuterol (VENTOLIN HFA)  108 (90 Base) MCG/ACT inhaler INHALE 2 PUFFS BY MOUTH EVERY 6 HOURS AS NEEDED FOR WHEEZING 06/11/23  Yes Shade Flood, MD  amLODipine (NORVASC) 10 MG tablet Take 1 tablet (10 mg total) by mouth daily. 07/01/23  Yes Shade Flood, MD  aspirin EC 81 MG tablet Take 1 tablet (81 mg total) by mouth in the morning and at bedtime. Swallow whole. 10/04/20  Yes Andrez Grime M, PA-C  atorvastatin (LIPITOR) 10 MG tablet Take 1 tablet (10 mg total) by mouth daily. Can restart once per week, increase  as tolerated to daily. 07/01/23  Yes Shade Flood, MD  cholecalciferol (VITAMIN D3) 25 MCG (1000 UNIT) tablet Take 1,000 Units by mouth daily.   Yes [provider]  EPINEPHrine 0.3 mg/0.3 mL IJ SOAJ injection Inject 0.3 mLs (0.3 mg total) into the muscle as needed for anaphylaxis. 03/28/20  Yes Shade Flood, MD  fluticasone [facility]) 50 MCG/ACT nasal spray PLACE 2 SPRAYS INTO BOTH NOSTRILS DAILY AS NEEDED (ALLERGIES.). 12/16/22  Yes Shade Flood, MD  fluticasone-salmeterol (ADVAIR) 250-50 MCG/ACT AEPB Inhale 1 puff into the lungs in the morning and at bedtime. 06/21/23  Yes Shade Flood, MD  hydrOXYzine (ATARAX) 10 MG tablet Take 1-2 tablets (10-20 mg total) by mouth at bedtime as needed. 07/01/23  Yes Shade Flood, MD  lisinopril-hydrochlorothiazide (ZESTORETIC) 20-25 MG tablet Take 1 tablet by mouth daily. 07/01/23  Yes Shade Flood, MD  predniSONE (DELTASONE) 20 MG tablet Take 2 tablets (40 mg total) by mouth daily with breakfast. 11/16/22  Yes Shade Flood, MD  sertraline (ZOLOFT) 50 MG tablet Take 1 tablet (50 mg total) by mouth daily. With food. 07/01/23  Yes Shade Flood, MD   Social History   Socioeconomic History   Marital status: Single    Spouse name: n/a   Number of children: 0   Years of education: Not on file   Highest education level: Not on file  Occupational History   Occupation: employed    Employer: [employer]  Tobacco Use   Smoking status: Never   Smokeless tobacco: Never  Vaping Use   Vaping status: Never Used  Substance and Sexual Activity   Alcohol use: No    Alcohol/week: 0.0 standard drinks of alcohol   Drug use: No   Sexual activity: Yes    Birth control/protection: Post-menopausal  Other Topics Concern   Not on file  Social History Narrative   Marital status:  Single; not dating      Children: none      Lives: alone      Employment:  Copywriter, [employer]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      Tobacco: none      Alcohol: none       Exercise:  Four days per week.   Social Determinants of Health   Financial Resource Strain: Not on file  Food Insecurity: Not on file  Transportation Needs: Not on file  Physical Activity: Not on file  Stress: Not on file  Social Connections: Not on file  Intimate Partner Violence: Not on file    Review of Systems   Objective:   Vitals:   08/02/23 1434  BP: 130/74  Pulse: 67  Temp: 97.9 F (36.6 C)  TempSrc: Temporal  SpO2: 96%  Weight: 242 lb (109.8 kg)  Height: 5\' 5"  (1.651 m)     Physical Exam Vitals reviewed.  Constitutional:      Appearance: Normal appearance. She is well-developed.  HENT:     Head: Normocephalic and atraumatic.  Eyes:     Conjunctiva/sclera: Conjunctivae normal.     Pupils: Pupils are equal, round, and reactive to light.  Neck:     Vascular: No carotid bruit.  Cardiovascular:     Rate and Rhythm: Normal rate and regular rhythm.     Heart sounds: Normal heart sounds.  Pulmonary:     Effort: Pulmonary effort is normal.     Breath sounds: Normal breath sounds.  Abdominal:     Palpations: Abdomen is soft. There is no pulsatile mass.     Tenderness: There is no abdominal tenderness.  Musculoskeletal:     Right lower leg: No edema.     Left lower leg: No edema.  Skin:    General: Skin is warm and dry.  Neurological:     Mental Status: She is alert and oriented to person, place, and time.  Psychiatric:        Mood and Affect: Mood normal.        Behavior: Behavior normal.     Assessment & Plan:  Debby Meddock is a 67 y.o. female . Hyperlipidemia, unspecified hyperlipidemia type  -Tolerating, episodic low back pain with history of back issues unlikely caused by statin.  Lab only visit in 1 month, then adjust regimen accordingly.  Insomnia, unspecified type - Plan: hydrOXYzine (VISTARIL) 25 MG capsule Generalized anxiety disorder  -Anxiety symptoms have improved, previous headaches improved.  Still some difficulty with  insomnia.  Does report improved sleep in the past when she exercise, recommended restarting exercise and slight higher dose of hydroxyzine as tolerated 20 mg dose previously.  Handout on insomnia, sleep hygiene again with RTC precautions.  77-month follow-up if stable.  Meds ordered this encounter  Medications   hydrOXYzine (VISTARIL) 25 MG capsule    Sig: Take 1 capsule (25 mg total) by mouth every 8 (eight) hours as needed.    Dispense:  30 capsule    Refill:  3   Patient Instructions  For cholesterol - continue lipitor daily. Lab visit in 1 month for repeat cholesterol.   Restart exercise during the day - something you enjoy. This along with the slight higher dose of  hydroxyzine (25mg ) should help sleep. Let me know if not.   Insomnia Insomnia is a sleep disorder that makes it difficult to fall asleep or stay asleep. Insomnia can cause fatigue, low energy, difficulty concentrating, mood swings, and poor performance at work or school. There are three different ways to classify insomnia: Difficulty falling asleep. Difficulty staying asleep. Waking up too early in the morning. Any type of insomnia can be long-term (chronic) or short-term (acute). Both are common. Short-term insomnia usually lasts for 3 months or less. Chronic insomnia occurs at least three times a week for longer than 3 months. What are the causes? Insomnia may be caused by another condition, situation, or substance, such as: Having certain mental health conditions, such as anxiety and depression. Using caffeine, alcohol, tobacco, or drugs. Having gastrointestinal conditions, such as gastroesophageal reflux disease (GERD). Having certain medical conditions. These include: Asthma. Alzheimer's disease. Stroke. Chronic pain. An overactive thyroid gland (hyperthyroidism). Other sleep disorders, such as restless legs syndrome and sleep apnea. Menopause. Sometimes, the cause of insomnia may not be known. What increases  the risk? Risk factors for insomnia include: Gender. Females are affected more often than males. Age. Insomnia is more common as people get older. Stress and certain medical and mental health conditions. Lack  of exercise. Having an irregular work schedule. This may include working night shifts and traveling between different time zones. What are the signs or symptoms? If you have insomnia, the main symptom is having trouble falling asleep or having trouble staying asleep. This may lead to other symptoms, such as: Feeling tired or having low energy. Feeling nervous about going to sleep. Not feeling rested in the morning. Having trouble concentrating. Feeling irritable, anxious, or depressed. How is this diagnosed? This condition may be diagnosed based on: Your symptoms and medical history. Your health care provider may ask about: Your sleep habits. Any medical conditions you have. Your mental health. A physical exam. How is this treated? Treatment for insomnia depends on the cause. Treatment may focus on treating an underlying condition that is causing the insomnia. Treatment may also include: Medicines to help you sleep. Counseling or therapy. Lifestyle adjustments to help you sleep better. Follow these instructions at home: Eating and drinking  Limit or avoid alcohol, caffeinated beverages, and products that contain nicotine and tobacco, especially close to bedtime. These can disrupt your sleep. Do not eat a large meal or eat spicy foods right before bedtime. This can lead to digestive discomfort that can make it hard for you to sleep. Sleep habits  Keep a sleep diary to help you and your health care provider figure out what could be causing your insomnia. Write down: When you sleep. When you wake up during the night. How well you sleep and how rested you feel the next day. Any side effects of medicines you are taking. What you eat and drink. Make your bedroom a dark,  comfortable place where it is easy to fall asleep. Put up shades or blackout curtains to block light from outside. Use a white noise machine to block noise. Keep the temperature cool. Limit screen use before bedtime. This includes: Not watching TV. Not using your smartphone, tablet, or computer. Stick to a routine that includes going to bed and waking up at the same times every day and night. This can help you fall asleep faster. Consider making a quiet activity, such as reading, part of your nighttime routine. Try to avoid taking naps during the day so that you sleep better at night. Get out of bed if you are still awake after 15 minutes of trying to sleep. Keep the lights down, but try reading or doing a quiet activity. When you feel sleepy, go back to bed. General instructions Take over-the-counter and prescription medicines only as told by your health care provider. Exercise regularly as told by your health care provider. However, avoid exercising in the hours right before bedtime. Use relaxation techniques to manage stress. Ask your health care provider to suggest some techniques that may work well for you. These may include: Breathing exercises. Routines to release muscle tension. Visualizing peaceful scenes. Make sure that you drive carefully. Do not drive if you feel very sleepy. Keep all follow-up visits. This is important. Contact a health care provider if: You are tired throughout the day. You have trouble in your daily routine due to sleepiness. You continue to have sleep problems, or your sleep problems get worse. Get help right away if: You have thoughts about hurting yourself or someone else. Get help right away if you feel like you may hurt yourself or others, or have thoughts about taking your own life. Go to your nearest emergency room or: Call 911. Call the National Suicide Prevention Lifeline at 802-864-8378 or 988. This is  open 24 hours a day. Text the Crisis Text  Line at 973-571-7431. Summary Insomnia is a sleep disorder that makes it difficult to fall asleep or stay asleep. Insomnia can be long-term (chronic) or short-term (acute). Treatment for insomnia depends on the cause. Treatment may focus on treating an underlying condition that is causing the insomnia. Keep a sleep diary to help you and your health care provider figure out what could be causing your insomnia. This information is not intended to replace advice given to you by your health care provider. Make sure you discuss any questions you have with your health care provider. Document Revised: 11/10/2021 Document Reviewed: 11/10/2021 Elsevier Patient Education  2024 Elsevier Inc.        Signed,   Meredith Staggers, MD [facility], [facility] 08/02/23 3:17 PM

## 2023-09-02 ENCOUNTER — Other Ambulatory Visit: Payer: Self-pay | Admitting: Family Medicine

## 2023-09-02 ENCOUNTER — Other Ambulatory Visit (INDEPENDENT_AMBULATORY_CARE_PROVIDER_SITE_OTHER): Payer: Medicare HMO

## 2023-09-02 DIAGNOSIS — E785 Hyperlipidemia, unspecified: Secondary | ICD-10-CM

## 2023-09-02 NOTE — Progress Notes (Signed)
See 8/19 note -repeat labs today.

## 2023-09-03 LAB — COMPREHENSIVE METABOLIC PANEL
ALT: 32 U/L (ref 0–35)
AST: 37 U/L (ref 0–37)
Albumin: 4.3 g/dL (ref 3.5–5.2)
Alkaline Phosphatase: 81 U/L (ref 39–117)
BUN: 18 mg/dL (ref 6–23)
CO2: 25 mEq/L (ref 19–32)
Calcium: 9.4 mg/dL (ref 8.4–10.5)
Chloride: 104 mEq/L (ref 96–112)
Creatinine, Ser: 0.89 mg/dL (ref 0.40–1.20)
GFR: 67.38 mL/min (ref >60.00)
Glucose, Bld: 98 mg/dL (ref 70–99)
Potassium: 3.8 mEq/L (ref 3.5–5.1)
Sodium: 138 mEq/L (ref 135–145)
Total Bilirubin: 0.7 mg/dL (ref 0.2–1.2)
Total Protein: 7.4 g/dL (ref 6.0–8.3)

## 2023-09-03 LAB — LIPID PANEL
Cholesterol: 136 mg/dL (ref 0–200)
HDL: 43.3 mg/dL (ref >39.00)
LDL Cholesterol: 71 mg/dL (ref 0–99)
NonHDL: 92.96
Total CHOL/HDL Ratio: 3
Triglycerides: 110 mg/dL (ref 0.0–149.0)
VLDL: 22 mg/dL (ref 0.0–40.0)

## 2023-09-05 ENCOUNTER — Other Ambulatory Visit: Payer: Self-pay | Admitting: Family Medicine

## 2023-09-05 DIAGNOSIS — F411 Generalized anxiety disorder: Secondary | ICD-10-CM

## 2023-09-06 ENCOUNTER — Telehealth: Payer: Self-pay

## 2023-09-06 NOTE — Telephone Encounter (Signed)
She returned your call

## 2023-09-06 NOTE — Telephone Encounter (Signed)
Good news, cholesterol levels and electrolytes, kidney, liver test all looked okay.  Let me know if you have questions.   Dr. Neva Seat

## 2023-09-06 NOTE — Telephone Encounter (Signed)
-----   Message from Shade Flood sent at 09/04/2023 12:48 PM EDT ----- Results sent by MyChart, but please make sure she receives results.  Letter or call.  Thanks

## 2023-09-06 NOTE — Telephone Encounter (Signed)
Pt has been notified.

## 2023-09-06 NOTE — Telephone Encounter (Signed)
Left vm to call about results.

## 2023-10-25 DIAGNOSIS — H04123 Dry eye syndrome of bilateral lacrimal glands: Secondary | ICD-10-CM | POA: Diagnosis not present

## 2023-10-25 DIAGNOSIS — H524 Presbyopia: Secondary | ICD-10-CM | POA: Diagnosis not present

## 2023-10-25 DIAGNOSIS — Z961 Presence of intraocular lens: Secondary | ICD-10-CM | POA: Diagnosis not present

## 2023-10-26 ENCOUNTER — Encounter: Payer: Self-pay | Admitting: Family Medicine

## 2023-11-25 ENCOUNTER — Telehealth: Payer: Self-pay

## 2023-11-25 NOTE — Telephone Encounter (Signed)
Called patient to schedule welcome to medicare visit, no answer LM to call back so we can schedule her for that visit.

## 2024-01-06 ENCOUNTER — Other Ambulatory Visit: Payer: Self-pay | Admitting: Family Medicine

## 2024-01-06 DIAGNOSIS — F411 Generalized anxiety disorder: Secondary | ICD-10-CM

## 2024-01-18 ENCOUNTER — Other Ambulatory Visit: Payer: Self-pay | Admitting: Family Medicine

## 2024-01-18 DIAGNOSIS — J45909 Unspecified asthma, uncomplicated: Secondary | ICD-10-CM

## 2024-01-27 ENCOUNTER — Ambulatory Visit: Payer: Medicare HMO | Admitting: Family Medicine

## 2024-01-27 ENCOUNTER — Encounter: Payer: Self-pay | Admitting: Family Medicine

## 2024-01-27 VITALS — BP 130/78 | HR 98 | Temp 98.2°F | Wt 248.1 lb

## 2024-01-27 DIAGNOSIS — Z0000 Encounter for general adult medical examination without abnormal findings: Secondary | ICD-10-CM | POA: Diagnosis not present

## 2024-01-27 NOTE — Progress Notes (Signed)
Subjective:  Patient ID: Nicole Reyes, female    DOB: Apr 14, 1956  Age: 68 y.o. MRN: [id number]  CC:  Chief Complaint  Patient presents with   welcome to medicare    Pt is here for welcome to medicare     HPI Nicole Reyes presents for   Presents for annual wellness exam, initial   Care team: PCP: [facility], Dr. Rennis Chris, right shoulder pain in 2023. Ortho/physical medicine and rehab -Dr. Shon Baton, Dr. Ethelene Hal, lumbar radiculopathy. Gastroenterology, Dr. Leone Payor. Optho - Dr. Zenaida Niece.  GYN - Dr. Chestine Spore at [facility] - Dr. Shelle Iron, knee replacement.   Chronic medical conditions discussed in August 2024.  She had self discontinued statin due to some arthralgias last year, then restarted Lipitor daily for improved cholesterol control.  Tolerating at her August visit.  LDL 71 at that time. Still on daily lipitor - no new arthralgias.  Lab Results  Component Value Date   CHOL 136 09/02/2023   HDL 43.30 09/02/2023   LDLCALC 71 09/02/2023   TRIG 110.0 09/02/2023   CHOLHDL 3 09/02/2023   History of generalized anxiety disorder with insomnia. Anxiety improved in August on sertraline.  Hydroxyzine as needed for insomnia with sleep hygiene, exercise discussed.  Higher dose of hydroxyzine 25 mg prescribed at that time with potential side effects and risk discussed.  Continued on sertraline 50 mg daily. Doing well.  Sleep better with higher dose hydroxyzine - about once per week.     01/27/2024   12:56 PM 08/02/2023    2:33 PM 07/01/2023    3:15 PM 04/18/2020   10:05 AM  GAD 7 : Generalized Anxiety Score  Nervous, Anxious, on Edge 2 0 0 0  Control/stop worrying 0 0 0 0  Worry too much - different things 0 0 0 0  Trouble relaxing 0 0 0 2  Restless 0 0 0 0  Easily annoyed or irritable 0 0 0 0  Afraid - awful might happen 0 0 0 0  Total GAD 7 Score 2 0 0 2  Anxiety Difficulty Not difficult at all       Hypertension: Amlodipine, lisinopril HCTZ.  Without any new  side effects. Home readings: none.  BP Readings from Last 3 Encounters:  01/27/24 130/78  08/02/23 130/74  07/01/23 130/72   Lab Results  Component Value Date   CREATININE 0.89 09/02/2023    Fall screening    01/27/2024   12:56 PM 08/02/2023    2:33 PM 07/01/2023    3:14 PM 11/16/2022    9:11 AM 09/11/2022    9:33 AM  Fall Risk   Falls in the past year? 0 0 0 0 0  Number falls in past yr: 0 0 0 0 0  Injury with Fall? 0 0 0 0 0  Risk for fall due to :  No Fall Risks No Fall Risks No Fall Risks No Fall Risks  Follow up  Falls evaluation completed Falls evaluation completed Falls evaluation completed Falls evaluation completed   Lighting in home: adequate.  Loose rugs/carpets/pets:none.  Stairs: none.  Grab bars in bathroom: does have.  Timed up and go: 9 seconds, normal gait, no instability.   Depression Screening:    01/27/2024   12:56 PM 08/02/2023    2:33 PM 07/01/2023    3:14 PM 11/16/2022    9:11 AM 09/11/2022    9:33 AM  Depression screen PHQ 2/9  Decreased Interest 0 0 0 0 0  Down, Depressed, Hopeless  0 0 0 0 0  PHQ - 2 Score 0 0 0 0 0  Altered sleeping 2 1 0 0 3  Tired, decreased energy 0 0 0 2 2  Change in appetite 0 0 0 0 0  Feeling bad or failure about yourself  0 0 0 0 0  Trouble concentrating 0 0 0 0 0  Moving slowly or fidgety/restless 0 0 0 0 0  Suicidal thoughts 0 0 0 0 0  PHQ-9 Score 2 1 0 2 5  Difficult doing work/chores Not difficult at all        Cancer Screening: Colonoscopy 08/26/2021, diminutive polyp that was not precancerous, repeat testing in 10 years. Mammogram 07/30/2023. Followed by GYN.  Bone density screening - had performed by GYN? Appt in August. Some hot flushes at times - past 6 months. Mild.   Immunization History  Administered Date(s) Administered   DTaP 01/21/2011   Influenza Split 09/15/2016   Influenza, Seasonal, Injecte, Preservative Fre 09/15/2015   Influenza,inj,Quad PF,6+ Mos 10/02/2017, 08/11/2019   Influenza,inj,quad,  With Preservative 09/13/2018   Influenza-Unspecified 09/13/2013, 09/15/2016   PFIZER(Purple Top)SARS-COV-2 Vaccination 03/01/2020, 03/22/2020   PNEUMOCOCCAL CONJUGATE-20 09/11/2022   Pneumococcal Polysaccharide-23 03/27/2013   Zoster Recombinant(Shingrix) 06/30/2019  [facility]nd [facility]rix at [facility].  Flu vaccine and covid booster at [facility].  Option of RSV vaccine at pharmacy  Functional Status Survey: Is the patient deaf or have difficulty hearing?: No Does the patient have difficulty seeing, even when wearing glasses/contacts?: No Does the patient have difficulty concentrating, remembering, or making decisions?: No Does the patient have difficulty walking or climbing stairs?: No Does the patient have difficulty dressing or bathing?: No Does the patient have difficulty doing errands alone such as visiting a doctor's office or shopping?: No  Memory Screen:    01/27/2024   12:59 PM 01/27/2024   12:57 PM  6CIT Screen  What Year?  0 points  What month?  0 points  What time?  3 points  Count back from 20  0 points  Months in reverse 0 points   Repeat phrase 0 points     Alcohol Screening: no alcohol.   Tobacco: none  Vision Screening   Right eye Left eye Both eyes  Without correction 20/40 20/30 20/30   With correction      Optho/optometry:Ophthalmology, Dr. Zenaida Niece, appointment November 11th.  Pseudophakia, dry eye syndrome, presbyopia, vitreous opacities, dermatochalasis upper lids.  Treated with refresh drops, 1 year follow-up planned.  Dental: Overdue. Plans to schedule/establish.   Exercise: Active with 2nd job. Cleaning, walking.  Over per week.   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   Dry eye syndrome 11/20/2017   Cataracts, bilateral 11/20/2017   Chronic right SI joint pain 01/28/2016   Nut allergy 01/28/2016   Obesity 01/25/2015   Pure hypercholesterolemia 02/10/2014   Moderate persistent asthma 12/19/2012   Hypertensive disorder 12/19/2012   Past Medical History:  Diagnosis Date   Allergy  Anxiety    Arthritis    Asthma    Glaucoma    Hypertension    Past Surgical History:  Procedure Laterality Date   COLONOSCOPY  06/29/2011   Normal   COLONOSCOPY&ENDOSCOPY  08/15/1979   PT UNSURE PLACE&MD WHO DID EXAMS=NORMAL   DILATION AND CURETTAGE OF UTERUS     LAPAROSCOPY     REMOVED SCARE TISSUE FROM OVARIES   ROTATOR CUFF REPAIR  04/24/2022   TOTAL KNEE ARTHROPLASTY Right 08/31/2018   Procedure: RIGHT  TOTAL KNEE ARTHROPLASTY;  Surgeon: Jene Every, MD;  Location: [facility];  Service: Orthopedics;  Laterality: Right;   TOTAL KNEE ARTHROPLASTY Left 10/02/2020   Procedure: TOTAL KNEE ARTHROPLASTY;  Surgeon: Jene Every, MD;  Location: [facility];  Service: Orthopedics;  Laterality: Left;  2.5 hrs   Allergies  Allergen Reactions   Peanut-Containing Drug Products Anaphylaxis    Also walnuts and almonds    Shellfish Allergy Hives   Cephalexin Rash   Prior to Admission medications   Medication Sig Start Date End Date Taking? Authorizing Provider  albuterol (VENTOLIN HFA) 108 (90 Base) MCG/ACT inhaler INHALE 2 PUFFS BY MOUTH EVERY 6 HOURS AS NEEDED FOR WHEEZING 06/11/23   Shade Flood, MD  amLODipine (NORVASC) 10 MG tablet Take 1 tablet (10 mg total) by mouth daily. 07/01/23   Shade Flood, MD  aspirin EC 81 MG tablet Take 1 tablet (81 mg total) by mouth in the morning and at bedtime. Swallow whole. 10/04/20   Dorothy Spark, PA-C  atorvastatin (LIPITOR) 10 MG tablet Take 1 tablet (10 mg total) by mouth daily. Can restart once per week, increase as tolerated to daily. 07/01/23   Shade Flood, MD  cholecalciferol (VITAMIN D3) 25 MCG (1000 UNIT) tablet Take 1,000 Units by mouth daily.    [provider]  EPINEPHrine 0.3 mg/0.3 mL IJ SOAJ injection Inject 0.3 mLs (0.3 mg total) into the muscle as needed for anaphylaxis. 03/28/20   Shade Flood, MD  fluticasone (FLONASE) 50 MCG/ACT nasal spray PLACE 2 SPRAYS INTO BOTH NOSTRILS DAILY AS NEEDED (ALLERGIES.). 12/16/22   Shade Flood, MD  hydrOXYzine (VISTARIL) 25 MG capsule Take 1 capsule (25 mg total) by mouth every 8 (eight) hours as needed. 08/02/23   Shade Flood, MD  lisinopril-hydrochlorothiazide (ZESTORETIC) 20-25 MG tablet Take 1 tablet by mouth daily. 07/01/23   Shade Flood, MD  predniSONE (DELTASONE) 20 MG tablet Take 2 tablets (40 mg total) by mouth daily with breakfast. 11/16/22   Shade Flood, MD  sertraline (ZOLOFT) 50 MG tablet TAKE 1 TABLET BY MOUTH DAILY. WITH FOOD. 01/06/24   Shade Flood, MD  Monte Fantasia INHUB 250-50 MCG/ACT AEPB INHALE 1 PUFF INTO THE LUNGS IN THE MORNING AND AT BEDTIME. 01/18/24   Shade Flood, MD   Social History   Socioeconomic History   Marital status: Single    Spouse name: n/a   Number of children: 0   Years of education: Not on file   Highest education level: Not on file  Occupational History   Occupation: employed    Employer: [employer]  Tobacco Use   Smoking status: Never   Smokeless tobacco: Never  Vaping Use   Vaping status: Never Used  Substance and Sexual Activity   Alcohol use: No    Alcohol/week: 0.0 standard drinks of alcohol   Drug use: No   Sexual activity: Yes    Birth control/protection: Post-menopausal  Other Topics Concern  Not on file  Social History Narrative   Marital status:  Single; not dating      Children: none      Lives: alone       Employment:  Copywriter, [employer]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      Tobacco: none      Alcohol: none      Exercise:  Four days per week.   Social Drivers of Corporate investment banker Strain: Not on file  Food Insecurity: Not on file  Transportation Needs: Not on file  Physical Activity: Not on file  Stress: Not on file  Social Connections: Not on file  Intimate Partner Violence: Not on file    Review of Systems  13 point review of systems per patient health survey noted.  Negative other than as indicated above or in HPI.   Objective:   Vitals:   01/27/24 1301  BP: 130/78  Pulse: 98  Temp: 98.2 F (36.8 C)  SpO2: 99%  Weight: 248 lb 2 oz (112.5 kg)     Physical Exam Vitals reviewed.  Constitutional:      Appearance: Normal appearance. She is well-developed.  HENT:     Head: Normocephalic and atraumatic.  Eyes:     Conjunctiva/sclera: Conjunctivae normal.     Pupils: Pupils are equal, round, and reactive to light.  Neck:     Vascular: No carotid bruit.  Cardiovascular:     Rate and Rhythm: Normal rate and regular rhythm.     Heart sounds: Normal heart sounds.  Pulmonary:     Effort: Pulmonary effort is normal.     Breath sounds: Normal breath sounds.  Abdominal:     Palpations: Abdomen is soft. There is no pulsatile mass.     Tenderness: There is no abdominal tenderness.  Musculoskeletal:     Right lower leg: No edema.     Left lower leg: No edema.  Skin:    General: Skin is warm and dry.  Neurological:     Mental Status: She is alert and oriented to person, place, and time.  Psychiatric:        Mood and Affect: Mood normal.        Behavior: Behavior normal.     Assessment & Plan:  Nicole Reyes is a 68 y.o. female . Welcome to Medicare preventive visit  - -anticipatory guidance as below in AVS, screening labs above. Health maintenance items as above in HPI discussed/recommended as applicable.    No orders of the defined types were placed in this  encounter.  Patient Instructions  Thank you for coming in today.  See the information on advanced directive and if you complete that, please provide a copy so we can scan that in to your chart. I do recommend establishing with a dentist.  Let me know if you need some names. Keep follow-up with eye specialist as planned as well as gynecology.  If you are having difficulty with hot flush symptoms, recommend discussing medications with your gynecologist. Check with your gynecologist to see if you have had a bone density test, one is recommended.  Follow-up in 3 months and we can recheck labs and discuss medications further at that time.  Take care!  Preventive Care 34 Years and Older, Female Preventive care refers to lifestyle choices and visits with your health care provider that can promote health and wellness. Preventive care visits are also called wellness exams. What can I expect for my preventive care visit? Counseling Your health  care provider may ask you questions about your: Medical history, including: Past medical problems. Family medical history. Pregnancy and menstrual history. History of falls. Current health, including: Memory and ability to understand (cognition). Emotional well-being. Home life and relationship well-being. Sexual activity and sexual health. Lifestyle, including: Alcohol, nicotine or tobacco, and drug use. Access to firearms. Diet, exercise, and sleep habits. Work and work Astronomer. Sunscreen use. Safety issues such as seatbelt and bike helmet use. Physical exam Your health care provider will check your: Height and weight. These may be used to calculate your BMI (body mass index). BMI is a measurement that tells if you are at a healthy weight. Waist circumference. This measures the distance around your waistline. This measurement also tells if you are at a healthy weight and may help predict your risk of certain diseases, such as type 2 diabetes and  high blood pressure. Heart rate and blood pressure. Body temperature. Skin for abnormal spots. What immunizations do I need?  Vaccines are usually given at various ages, according to a schedule. Your health care provider will recommend vaccines for you based on your age, medical history, and lifestyle or other factors, such as travel or where you work. What tests do I need? Screening Your health care provider may recommend screening tests for certain conditions. This may include: Lipid and cholesterol levels. Hepatitis C test. Hepatitis B test. HIV (human immunodeficiency virus) test. STI (sexually transmitted infection) testing, if you are at risk. Lung cancer screening. Colorectal cancer screening. Diabetes screening. This is done by checking your blood sugar (glucose) after you have not eaten for a while (fasting). Mammogram. Talk with your health care provider about how often you should have regular mammograms. BRCA-related cancer screening. This may be done if you have a family history of breast, ovarian, tubal, or peritoneal cancers. Bone density scan. This is done to screen for osteoporosis. Talk with your health care provider about your test results, treatment options, and if necessary, the need for more tests. Follow these instructions at home: Eating and drinking  Eat a diet that includes fresh fruits and vegetables, whole grains, lean protein, and low-fat dairy products. Limit your intake of foods with high amounts of sugar, saturated fats, and salt. Take vitamin and mineral supplements as recommended by your health care provider. Do not drink alcohol if your health care provider tells you not to drink. If you drink alcohol: Limit how much you have to 0-1 drink a day. Know how much alcohol is in your drink. In the [place] one drink equals one 12 oz bottle of beer (355 mL), one 5 oz glass of wine (148 mL), or one 1 oz glass of hard liquor (44 mL). Lifestyle Brush your teeth  every morning and night with fluoride toothpaste. Floss one time each day. Exercise for at least 30 minutes 5 or more days each week. Do not use any products that contain nicotine or tobacco. These products include cigarettes, chewing tobacco, and vaping devices, such as e-cigarettes. If you need help quitting, ask your health care provider. Do not use drugs. If you are sexually active, practice safe sex. Use a condom or other form of protection in order to prevent STIs. Take aspirin only as told by your health care provider. Make sure that you understand how much to take and what form to take. Work with your health care provider to find out whether it is safe and beneficial for you to take aspirin daily. Ask your health care provider if  you need to take a cholesterol-lowering medicine (statin). Find healthy ways to manage stress, such as: Meditation, yoga, or listening to music. Journaling. Talking to a trusted person. Spending time with friends and family. Minimize exposure to UV radiation to reduce your risk of skin cancer. Safety Always wear your seat belt while driving or riding in a vehicle. Do not drive: If you have been drinking alcohol. Do not ride with someone who has been drinking. When you are tired or distracted. While texting. If you have been using any mind-altering substances or drugs. Wear a helmet and other protective equipment during sports activities. If you have firearms in your house, make sure you follow all gun safety procedures. What's next? Visit your health care provider once a year for an annual wellness visit. Ask your health care provider how often you should have your eyes and teeth checked. Stay up to date on all vaccines. This information is not intended to replace advice given to you by your health care provider. Make sure you discuss any questions you have with your health care provider. Document Revised: 05/28/2021 Document Reviewed: 05/28/2021 Elsevier  Patient Education  2024 Elsevier Inc.    Signed,   Meredith Staggers, MD [facility], [facility] 01/27/24 1:46 PM

## 2024-01-27 NOTE — Patient Instructions (Signed)
Thank you for coming in today.  See the information on advanced directive and if you complete that, please provide a copy so we can scan that in to your chart. I do recommend establishing with a dentist.  Let me know if you need some names. Keep follow-up with eye specialist as planned as well as gynecology.  If you are having difficulty with hot flush symptoms, recommend discussing medications with your gynecologist. Check with your gynecologist to see if you have had a bone density test, one is recommended.  Follow-up in 3 months and we can recheck labs and discuss medications further at that time.  Take care!  Preventive Care 30 Years and Older, Female Preventive care refers to lifestyle choices and visits with your health care provider that can promote health and wellness. Preventive care visits are also called wellness exams. What can I expect for my preventive care visit? Counseling Your health care provider may ask you questions about your: Medical history, including: Past medical problems. Family medical history. Pregnancy and menstrual history. History of falls. Current health, including: Memory and ability to understand (cognition). Emotional well-being. Home life and relationship well-being. Sexual activity and sexual health. Lifestyle, including: Alcohol, nicotine or tobacco, and drug use. Access to firearms. Diet, exercise, and sleep habits. Work and work Astronomer. Sunscreen use. Safety issues such as seatbelt and bike helmet use. Physical exam Your health care provider will check your: Height and weight. These may be used to calculate your BMI (body mass index). BMI is a measurement that tells if you are at a healthy weight. Waist circumference. This measures the distance around your waistline. This measurement also tells if you are at a healthy weight and may help predict your risk of certain diseases, such as type 2 diabetes and high blood pressure. Heart rate and  blood pressure. Body temperature. Skin for abnormal spots. What immunizations do I need?  Vaccines are usually given at various ages, according to a schedule. Your health care provider will recommend vaccines for you based on your age, medical history, and lifestyle or other factors, such as travel or where you work. What tests do I need? Screening Your health care provider may recommend screening tests for certain conditions. This may include: Lipid and cholesterol levels. Hepatitis C test. Hepatitis B test. HIV (human immunodeficiency virus) test. STI (sexually transmitted infection) testing, if you are at risk. Lung cancer screening. Colorectal cancer screening. Diabetes screening. This is done by checking your blood sugar (glucose) after you have not eaten for a while (fasting). Mammogram. Talk with your health care provider about how often you should have regular mammograms. BRCA-related cancer screening. This may be done if you have a family history of breast, ovarian, tubal, or peritoneal cancers. Bone density scan. This is done to screen for osteoporosis. Talk with your health care provider about your test results, treatment options, and if necessary, the need for more tests. Follow these instructions at home: Eating and drinking  Eat a diet that includes fresh fruits and vegetables, whole grains, lean protein, and low-fat dairy products. Limit your intake of foods with high amounts of sugar, saturated fats, and salt. Take vitamin and mineral supplements as recommended by your health care provider. Do not drink alcohol if your health care provider tells you not to drink. If you drink alcohol: Limit how much you have to 0-1 drink a day. Know how much alcohol is in your drink. In the [place] one drink equals one 12 oz bottle of  beer (355 mL), one 5 oz glass of wine (148 mL), or one 1 oz glass of hard liquor (44 mL). Lifestyle Brush your teeth every morning and night with fluoride  toothpaste. Floss one time each day. Exercise for at least 30 minutes 5 or more days each week. Do not use any products that contain nicotine or tobacco. These products include cigarettes, chewing tobacco, and vaping devices, such as e-cigarettes. If you need help quitting, ask your health care provider. Do not use drugs. If you are sexually active, practice safe sex. Use a condom or other form of protection in order to prevent STIs. Take aspirin only as told by your health care provider. Make sure that you understand how much to take and what form to take. Work with your health care provider to find out whether it is safe and beneficial for you to take aspirin daily. Ask your health care provider if you need to take a cholesterol-lowering medicine (statin). Find healthy ways to manage stress, such as: Meditation, yoga, or listening to music. Journaling. Talking to a trusted person. Spending time with friends and family. Minimize exposure to UV radiation to reduce your risk of skin cancer. Safety Always wear your seat belt while driving or riding in a vehicle. Do not drive: If you have been drinking alcohol. Do not ride with someone who has been drinking. When you are tired or distracted. While texting. If you have been using any mind-altering substances or drugs. Wear a helmet and other protective equipment during sports activities. If you have firearms in your house, make sure you follow all gun safety procedures. What's next? Visit your health care provider once a year for an annual wellness visit. Ask your health care provider how often you should have your eyes and teeth checked. Stay up to date on all vaccines. This information is not intended to replace advice given to you by your health care provider. Make sure you discuss any questions you have with your health care provider. Document Revised: 05/28/2021 Document Reviewed: 05/28/2021 Elsevier Patient Education  2024 [employer].

## 2024-02-02 ENCOUNTER — Ambulatory Visit: Payer: Medicare HMO | Admitting: Family Medicine

## 2024-03-05 ENCOUNTER — Other Ambulatory Visit: Payer: Self-pay | Admitting: Family Medicine

## 2024-03-05 DIAGNOSIS — I10 Essential (primary) hypertension: Secondary | ICD-10-CM

## 2024-03-05 DIAGNOSIS — E785 Hyperlipidemia, unspecified: Secondary | ICD-10-CM

## 2024-03-05 DIAGNOSIS — F411 Generalized anxiety disorder: Secondary | ICD-10-CM

## 2024-04-16 ENCOUNTER — Other Ambulatory Visit: Payer: Self-pay | Admitting: Family Medicine

## 2024-04-16 DIAGNOSIS — F411 Generalized anxiety disorder: Secondary | ICD-10-CM

## 2024-04-19 ENCOUNTER — Encounter (HOSPITAL_COMMUNITY): Payer: Self-pay

## 2024-04-26 ENCOUNTER — Ambulatory Visit (INDEPENDENT_AMBULATORY_CARE_PROVIDER_SITE_OTHER): Payer: Medicare HMO | Admitting: Family Medicine

## 2024-04-26 VITALS — BP 136/80 | HR 87 | Wt 247.0 lb

## 2024-04-26 DIAGNOSIS — R42 Dizziness and giddiness: Secondary | ICD-10-CM

## 2024-04-26 DIAGNOSIS — J22 Unspecified acute lower respiratory infection: Secondary | ICD-10-CM | POA: Diagnosis not present

## 2024-04-26 DIAGNOSIS — M5410 Radiculopathy, site unspecified: Secondary | ICD-10-CM | POA: Diagnosis not present

## 2024-04-26 DIAGNOSIS — G4700 Insomnia, unspecified: Secondary | ICD-10-CM | POA: Diagnosis not present

## 2024-04-26 DIAGNOSIS — E785 Hyperlipidemia, unspecified: Secondary | ICD-10-CM | POA: Diagnosis not present

## 2024-04-26 DIAGNOSIS — I10 Essential (primary) hypertension: Secondary | ICD-10-CM | POA: Diagnosis not present

## 2024-04-26 DIAGNOSIS — J45909 Unspecified asthma, uncomplicated: Secondary | ICD-10-CM

## 2024-04-26 DIAGNOSIS — R052 Subacute cough: Secondary | ICD-10-CM

## 2024-04-26 DIAGNOSIS — Z91010 Allergy to peanuts: Secondary | ICD-10-CM

## 2024-04-26 MED ORDER — EPINEPHRINE 0.3 MG/0.3ML IJ SOAJ: 0.3000 mg | INTRAMUSCULAR | 0 refills | Status: AC | PRN

## 2024-04-26 MED ORDER — FLUTICASONE-SALMETEROL 250-50 MCG/ACT IN AEPB: 1.0000 | INHALATION_SPRAY | Freq: Two times a day (BID) | RESPIRATORY_TRACT | 5 refills | Status: AC

## 2024-04-26 MED ORDER — AZITHROMYCIN 250 MG PO TABS: ORAL_TABLET | ORAL | 0 refills | Status: AC

## 2024-04-26 NOTE — Progress Notes (Signed)
 Subjective:  Patient ID: Nicole Reyes, female    DOB: [birth year]  Age: 68 y.o. MRN: [id number]  CC:  Chief Complaint  Patient presents with   Medical Management of Chronic Issues    Patient states she is light headed. Patient states she has not eaten today.  Needs new prescription for EPI pen. Patient states she would like to have a dexa scan completed and complaints of soreness in body.    HPI Nicole Reyes presents for multiple concerns above in addition to chronic care follow-up.  We discussed starting some of these concerns today with close follow-up to review other nonurgent issues.   Asthma with allergies: Wixela 250/50 1 puff BID. Flonase . Albuterol  if needed  - about once on weekends. Some cough - every other day.  Yellow phlegm with cough past 2 weeks. No fever. Dry cough prior with pollen.  Some increased congestion past few weeks - flonase  intermittently. Every few days.   Epipen  refill: Peanut allergy. Needs new rx. No recent reactions or Epipen  needed  Hypertension: Does note some lightheadedness as above. No syncope/near syncope. No room spinning sensation.  No change in diet, no new meds, supplements.  Noted past 2 weeks. No melena/hematochezia.  Notes with standing up. Turning head at times or with cough at times.  Headaches at times. Alleve or tylenol  helps.  On amlodipine  10mg  every day, lisinopril  hydrochlorothiazide  20/25mg  every day.  Denies any missed doses.  Home readings: none.  Upper chest soreness with using arms more, no chest pain/pressure/palpitations.   BP Readings from Last 3 Encounters:  04/26/24 (!) 140/80  01/27/24 130/78  08/02/23 130/74   Lab Results  Component Value Date   CREATININE 0.89 09/02/2023   Anxiety/insomnia: Discussed in 07/2023, improved anxiety sx's, HA at that time. Insomnia persisted - higher dose hydroxyzine  ordered. Handout on sleep hygiene. Hydroxyzine  as needed for sleep - 2 times per week - working well.  Lightheaded at times in middle of night on nights taken, also on other nights.       04/26/2024    1:21 PM 01/27/2024   12:56 PM 08/02/2023    2:33 PM 07/01/2023    3:15 PM  GAD 7 : Generalized Anxiety Score  Nervous, Anxious, on Edge 0 2 0 0  Control/stop worrying 0 0 0 0  Worry too much - different things 0 0 0 0  Trouble relaxing 0 0 0 0  Restless 0 0 0 0  Easily annoyed or irritable 0 0 0 0  Afraid - awful might happen 0 0 0 0  Total GAD 7 Score 0 2 0 0  Anxiety Difficulty Not difficult at all Not difficult at all     Hyperlipidemia: Lipitor 10mg . Taking every other day starting last week - was having some aches in back. Sometimes pain going down R leg - noticed last week, better today. Had some pain yesterday in R leg, not today.  No bowel or bladder incontinence, no saddle anesthesia, no lower extremity weakness. Hx of pinched nerve in past, sciatica.    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   Acute otitis media 01/08/2019   Sore throat 01/08/2019   Osteoarthritis of right knee 08/31/2018   Right knee DJD 08/31/2018   Allergic reaction 05/23/2018   Allergic urticaria 05/23/2018   Perennial and seasonal allergic rhinitis 05/23/2018   Allergic conjunctivitis 05/23/2018   Food allergy 05/23/2018   Carpal tunnel syndrome of left wrist 01/25/2018   Lattice degeneration 11/20/2017   Dry eye syndrome 11/20/2017   Cataracts, bilateral 11/20/2017   Chronic right SI joint pain 01/28/2016   Nut allergy 01/28/2016   Obesity 01/25/2015   Pure hypercholesterolemia 02/10/2014   Moderate persistent asthma 12/19/2012    Hypertensive disorder 12/19/2012   Past Medical History:  Diagnosis Date   Allergy    Anxiety    Arthritis    Asthma    Glaucoma    Hypertension    Past Surgical History:  Procedure Laterality Date   COLONOSCOPY  06/29/2011   Normal   COLONOSCOPY&ENDOSCOPY  08/15/1979   PT UNSURE PLACE&MD WHO DID EXAMS=NORMAL   DILATION AND CURETTAGE OF UTERUS     LAPAROSCOPY     REMOVED SCARE TISSUE FROM OVARIES   ROTATOR CUFF REPAIR  04/24/2022   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 Authorizing Provider  albuterol  (VENTOLIN  HFA) 108 (90 Base) MCG/ACT inhaler INHALE 2 PUFFS BY MOUTH EVERY 6 HOURS AS NEEDED FOR WHEEZING 06/11/23  Yes Benjiman Bras, MD  amLODipine  (NORVASC ) 10 MG tablet TAKE 1 TABLET BY MOUTH EVERY DAY 03/06/24  Yes Benjiman Bras, MD  aspirin  EC 81 MG tablet Take 1 tablet (81 mg total) by mouth in the morning and at bedtime. Swallow whole. 10/04/20  Yes Bissell, Jaclyn M, PA-C  atorvastatin  (LIPITOR) 10 MG tablet TAKE 1 TABLET (10 MG TOTAL) BY MOUTH DAILY. CAN RESTART ONCE PER WEEK, INCREASE AS TOLERATED TO DAILY. 03/06/24  Yes Benjiman Bras, MD  cholecalciferol (VITAMIN D3) 25 MCG (1000 UNIT) tablet Take 1,000 Units by mouth daily.   Yes [provider]  EPINEPHrine  0.3 mg/0.3 mL IJ SOAJ injection Inject 0.3 mLs (0.3 mg total) into the muscle as needed for anaphylaxis. 03/28/20  Yes Benjiman Bras, MD  fluticasone   (FLONASE ) 50 MCG/ACT nasal spray PLACE 2 SPRAYS INTO BOTH NOSTRILS DAILY AS NEEDED (ALLERGIES.). 12/16/22  Yes Benjiman Bras, MD  hydrOXYzine  (VISTARIL ) 25 MG capsule Take 1 capsule (25 mg total) by mouth every 8 (eight) hours as needed. 08/02/23  Yes Benjiman Bras, MD  lisinopril -hydrochlorothiazide  (ZESTORETIC ) 20-25 MG tablet TAKE 1 TABLET BY MOUTH EVERY DAY 03/06/24  Yes Benjiman Bras, MD  sertraline  (ZOLOFT ) 50 MG tablet TAKE 1 TABLET BY MOUTH DAILY. WITH FOOD. 04/17/24  Yes Benjiman Bras, MD  WIXELA INHUB 250-50 MCG/ACT AEPB INHALE 1 PUFF INTO THE LUNGS IN THE MORNING AND AT BEDTIME. 01/18/24  Yes Benjiman Bras, MD  predniSONE  (DELTASONE ) 20 MG tablet Take 2  tablets (40 mg total) by mouth daily with breakfast. Patient not taking: Reported on 04/26/2024 11/16/22   Benjiman Bras, MD   Social History   Socioeconomic History   Marital status: Single    Spouse name: n/a   Number of children: 0   Years of education: Not on file   Highest education level: Not on file  Occupational History   Occupation: employed    Employer: [employer]  Tobacco Use   Smoking status: Never   Smokeless tobacco: Never  Vaping Use   Vaping status: Never Used  Substance and Sexual Activity   Alcohol use: No    Alcohol/week: 0.0 standard drinks of alcohol   Drug use: No   Sexual activity: Yes    Birth control/protection: Post-menopausal  Other Topics Concern   Not on file  Social History Narrative   Marital status:  Single; not dating      Children: none      Lives: alone      Employment:  Copywriter, [employer]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      Tobacco: none      Alcohol: none      Exercise:  Four days per week.   Social Drivers of Corporate investment banker Strain: Not on file  Food Insecurity: Not on file  Transportation Needs: Not on file  Physical Activity: Not on file  Stress: Not on file  Social Connections: Not on file  Intimate Partner Violence: Not on file    Review of Systems Per  HPI.   Objective:   Vitals:   04/26/24 1316 04/26/24 1436  BP: (!) 140/80 136/80  Pulse: 87   SpO2: 96%   Weight: 247 lb (112 kg)      Physical Exam Vitals reviewed.  Constitutional:      General: She is not in acute distress.    Appearance: Normal appearance. She is well-developed. She is not ill-appearing, toxic-appearing or diaphoretic.  HENT:     Head: Normocephalic and atraumatic.  Eyes:     Conjunctiva/sclera: Conjunctivae normal.     Pupils: Pupils are equal, round, and reactive to light.  Neck:     Vascular: No carotid bruit.  Cardiovascular:     Rate and Rhythm: Normal rate and regular rhythm.     Heart sounds: Normal heart sounds.  Pulmonary:     Effort: Pulmonary effort is normal.     Breath sounds: Normal breath sounds.  Abdominal:     Palpations: Abdomen is soft. There is no pulsatile mass.     Tenderness: There is no abdominal tenderness.  Musculoskeletal:     Right lower leg: No edema.     Left lower leg: No edema.  Skin:    General: Skin is warm and dry.  Neurological:     Mental Status: She is alert and oriented to person, place, and time.  Psychiatric:        Mood and Affect: Mood normal.        Behavior: Behavior normal.     Orthostatic VS for the past 24 hrs (Last 3 readings):  BP- Lying Pulse- Lying BP- Sitting Pulse- Sitting BP- Standing at 0 minutes Pulse- Standing at 0 minutes BP- Standing at 3 minutes Pulse- Standing at 3 minutes  04/26/24 1436 144/88 85 140/82 88 136/80 86 154/80 84    EKG, sinus rhythm rate 79, PR 178, QTc 451.Compared to August 2021, no apparent significant changes.   60 minutes spent during visit, including chart review,  counseling and assimilation of information, exam, discussion of plan, and chart completion.  Time exclusive of EKG interpretation.   Assessment & Plan:  Nicole Reyes is a 69 y.o. female . Lightheadedness - Plan: EKG 12-Lead  - No acute findings on EKG, no specific trigger for  lightheadedness, and does not sound vertiginous exclusively as not limited to symptoms with head movement only.  Not orthostatic on testing in office.  Potentially could be related to allergies or congestion, could potentially be related to hydroxyzine  as well but not specifically during use.  Multifactorial possible.  Will check some screening labs, adequate hydration discussed, Flonase  daily to see if that helps with allergic/sinus cause with RTC/ER precautions.  Hyperlipidemia, unspecified hyperlipidemia type  - With back pain, leg pain  - likely sciatica, I do not think this is related to her statin.  Recommended restarting statin with RTC precautions.  Sciatic symptoms have resolved, RTC precautions if recurrent.  Essential hypertension - Plan: EKG 12-Lead  - Borderline, but no changes at this time with intermittent lightheadedness.  Continue to monitor with RTC precautions.  Insomnia, unspecified type  - Hydroxyzine  as needed with lowest effective dose, side effects discussed including potential attribution to dizziness as above.  Extrinsic asthma without complication, unspecified asthma severity, unspecified whether persistent - Plan: fluticasone -salmeterol (WIXELA INHUB) 250-50 MCG/ACT AEPB Subacute cough - Plan: azithromycin  (ZITHROMAX ) 250 MG tablet LRTI (lower respiratory tract infection) - Plan: azithromycin  (ZITHROMAX ) 250 MG tablet - Will continue same inhaler for asthma, appears to be overall stable but start Z-Pak for lower respiratory tract infection, productive cough, with recheck in 2 weeks.  Hold on imaging for now, RTC/ER precautions if worsening.  Radicular low back pain Intermittent sciatic symptoms as above.  Asymptomatic at present with RTC precautions.  Unlikely related to statin.  Plan to restart.   Meds ordered this encounter  Medications   azithromycin  (ZITHROMAX ) 250 MG tablet    Sig: Take 2 tablets on day 1, then 1 tablet daily on days 2 through 5    Dispense:   6 tablet    Refill:  0   fluticasone -salmeterol (WIXELA INHUB) 250-50 MCG/ACT AEPB    Sig: Inhale 1 puff into the lungs 2 (two) times daily. in the morning and at bedtime.    Dispense:  60 each    Refill:  5   Patient Instructions  Try taking Flonase  daily to see if that will help with congestion which sometimes can cause some lightheadedness or dizziness.  I will also check some labs for the dizziness and let you know if there are any concerns on those results.  If any new or worsening symptoms or worsening dizziness be seen right away.  I do not expect this to occur.  Try increasing fluid intake throughout the day, make sure you are eating regular meals, see information below.    I do recommend restarting the cholesterol pill daily since it did not make much change in the back or leg pain with decreasing the dose and it is unlikely the cause of the symptoms.  I will check those labs today. I have refilled the EpiPen  if needed.  Continue the Wixela inhaler for asthma, antibiotic was ordered for recent productive cough.  Will follow-up in 2 weeks and decide if other testing needed.  Be seen sooner if any new or worsening symptoms.  Continue same dose sertraline  for anxiety at this time, hydroxyzine  1/2-1 at bedtime as needed.  Use lowest effective dose to help with  sleep as sometimes that can have side effects including lightheadedness or dizziness.  Thank you for coming in today.    Dizziness Dizziness is a common problem. It makes you feel unsteady or light-headed. You may feel like you're about to faint. Dizziness can lead to getting hurt if you stumble or fall. It's more common to feel dizzy if you're an older adult. Many things can cause you to feel dizzy. These include: Medicines. Dehydration. This is when there's not enough water  in your body. Illness. Follow these instructions at home: Eating and drinking  Drink enough fluid to keep your pee (urine) pale yellow. This helps keep  you from getting dehydrated. Try to drink more clear fluids, such as water . Do not drink alcohol. Try to limit how much caffeine you take in. Try to limit how much salt, also called sodium, you take in. Activity Try not to make quick movements. Stand up slowly from sitting in a chair. Steady yourself until you feel okay. In the morning, first sit up on the side of the bed. When you feel okay, hold onto something and slowly stand up. Do this until you know that your balance is okay. If you need to stand in one place for a long time, move your legs often. Tighten and relax the muscles in your legs while you're standing. Do not drive or use machines if you feel dizzy. Avoid bending down if you feel dizzy. Place items in your home so you can reach them without leaning over. Lifestyle Do not smoke, vape, or use products with nicotine or tobacco in them. If you need help quitting, talk with your health care provider. Try to lower your stress level. You can do this by using methods like yoga or meditation. Talk with your provider if you need help. General instructions Watch your dizziness for any changes. Take your medicines only as told by your provider. Talk with your provider if you think you're dizzy because of a medicine you're taking. Tell a friend or a family member that you're feeling dizzy. If they spot any changes in your behavior, have them call your provider. Contact a health care provider if: Your dizziness doesn't go away, or you have new symptoms. Your dizziness gets worse. You feel like you may vomit. You have trouble hearing. You have a fever. You have neck pain or a stiff neck. You fall or get hurt. Get help right away if: You vomit each time you eat or drink. You have watery poop and can't eat or drink. You have trouble talking, walking, swallowing, or using your arms, hands, or legs. You feel very weak. You're bleeding. You're not thinking clearly, or you have trouble  forming sentences. A friend or family member may spot this. Your vision changes, or you get a very bad headache. These symptoms may be an emergency. Call 911 right away. Do not wait to see if the symptoms will go away. Do not drive yourself to the hospital. This information is not intended to replace advice given to you by your health care provider. Make sure you discuss any questions you have with your health care provider. Document Revised: 09/02/2023 Document Reviewed: 01/14/2023 Elsevier Patient Education  2024 [employer]     Signed,   Caro Christmas, MD [facility], [facility] 04/26/24 2:31 PM

## 2024-04-26 NOTE — Patient Instructions (Addendum)
 Try taking Flonase  daily to see if that will help with congestion which sometimes can cause some lightheadedness or dizziness.  I will also check some labs for the dizziness and let you know if there are any concerns on those results.  If any new or worsening symptoms or worsening dizziness be seen right away.  I do not expect this to occur.  Try increasing fluid intake throughout the day, make sure you are eating regular meals, see information below.  Blood pressure was borderline elevated here in the office today but with the lightheadedness I am hesitant to increase or add additional medicines at this time.  Keep a record of those blood pressures at home and if you persistently have above 140 and the top number or above 90 on the lower number, let me know and we can add a medication.  We will check this at follow-up in the next 2 weeks.   I do recommend restarting the cholesterol pill daily since it did not make much change in the back or leg pain with decreasing the dose and it is unlikely the cause of the symptoms.  I will check those labs today. I have refilled the EpiPen  if needed.  Continue the Wixela inhaler for asthma, antibiotic was ordered for recent productive cough.  Will follow-up in 2 weeks and decide if other testing needed.  Be seen sooner if any new or worsening symptoms.  Continue same dose sertraline  for anxiety at this time, hydroxyzine  1/2-1 at bedtime as needed.  Use lowest effective dose to help with sleep as sometimes that can have side effects including lightheadedness or dizziness.  Thank you for coming in today.    Dizziness Dizziness is a common problem. It makes you feel unsteady or light-headed. You may feel like you're about to faint. Dizziness can lead to getting hurt if you stumble or fall. It's more common to feel dizzy if you're an older adult. Many things can cause you to feel dizzy. These include: Medicines. Dehydration. This is when there's not enough water  in  your body. Illness. Follow these instructions at home: Eating and drinking  Drink enough fluid to keep your pee (urine) pale yellow. This helps keep you from getting dehydrated. Try to drink more clear fluids, such as water . Do not drink alcohol. Try to limit how much caffeine you take in. Try to limit how much salt, also called sodium, you take in. Activity Try not to make quick movements. Stand up slowly from sitting in a chair. Steady yourself until you feel okay. In the morning, first sit up on the side of the bed. When you feel okay, hold onto something and slowly stand up. Do this until you know that your balance is okay. If you need to stand in one place for a long time, move your legs often. Tighten and relax the muscles in your legs while you're standing. Do not drive or use machines if you feel dizzy. Avoid bending down if you feel dizzy. Place items in your home so you can reach them without leaning over. Lifestyle Do not smoke, vape, or use products with nicotine or tobacco in them. If you need help quitting, talk with your health care provider. Try to lower your stress level. You can do this by using methods like yoga or meditation. Talk with your provider if you need help. General instructions Watch your dizziness for any changes. Take your medicines only as told by your provider. Talk with your provider if  you think you're dizzy because of a medicine you're taking. Tell a friend or a family member that you're feeling dizzy. If they spot any changes in your behavior, have them call your provider. Contact a health care provider if: Your dizziness doesn't go away, or you have new symptoms. Your dizziness gets worse. You feel like you may vomit. You have trouble hearing. You have a fever. You have neck pain or a stiff neck. You fall or get hurt. Get help right away if: You vomit each time you eat or drink. You have watery poop and can't eat or drink. You have trouble  talking, walking, swallowing, or using your arms, hands, or legs. You feel very weak. You're bleeding. You're not thinking clearly, or you have trouble forming sentences. A friend or family member may spot this. Your vision changes, or you get a very bad headache. These symptoms may be an emergency. Call 911 right away. Do not wait to see if the symptoms will go away. Do not drive yourself to the hospital. This information is not intended to replace advice given to you by your health care provider. Make sure you discuss any questions you have with your health care provider. Document Revised: 09/02/2023 Document Reviewed: 01/14/2023 Elsevier Patient Education  2024 [employer].

## 2024-04-29 ENCOUNTER — Encounter: Payer: Self-pay | Admitting: Family Medicine

## 2024-06-07 ENCOUNTER — Ambulatory Visit (INDEPENDENT_AMBULATORY_CARE_PROVIDER_SITE_OTHER): Admitting: Family Medicine

## 2024-06-07 ENCOUNTER — Encounter: Payer: Self-pay | Admitting: Family Medicine

## 2024-06-07 VITALS — BP 122/64 | HR 74 | Temp 98.1°F | Resp 14 | Ht 65.0 in | Wt 242.4 lb

## 2024-06-07 DIAGNOSIS — M79641 Pain in right hand: Secondary | ICD-10-CM

## 2024-06-07 DIAGNOSIS — R109 Unspecified abdominal pain: Secondary | ICD-10-CM

## 2024-06-07 DIAGNOSIS — R42 Dizziness and giddiness: Secondary | ICD-10-CM | POA: Diagnosis not present

## 2024-06-07 DIAGNOSIS — R1110 Vomiting, unspecified: Secondary | ICD-10-CM

## 2024-06-07 DIAGNOSIS — L304 Erythema intertrigo: Secondary | ICD-10-CM | POA: Insufficient documentation

## 2024-06-07 DIAGNOSIS — R052 Subacute cough: Secondary | ICD-10-CM | POA: Diagnosis not present

## 2024-06-07 DIAGNOSIS — R110 Nausea: Secondary | ICD-10-CM

## 2024-06-07 MED ORDER — BENZONATATE 100 MG PO CAPS: 100.0000 mg | ORAL_CAPSULE | Freq: Three times a day (TID) | ORAL | 0 refills | Status: AC | PRN

## 2024-06-07 MED ORDER — IPRATROPIUM BROMIDE 0.06 % NA SOLN: 1.0000 | Freq: Four times a day (QID) | NASAL | 0 refills | Status: DC

## 2024-06-07 MED ORDER — FLUTICASONE PROPIONATE 50 MCG/ACT NA SUSP
2.0000 | Freq: Every day | NASAL | 1 refills | Status: AC | PRN
Start: 2024-06-07 — End: ?

## 2024-06-07 NOTE — Progress Notes (Signed)
 Subjective:  Patient ID: Nicole Reyes, female    DOB: Dec 14, 1956  Age: 68 y.o. MRN: [id number]  CC:  Chief Complaint  Patient presents with   Dizziness    Pt notes has still been dizzy, nauseous, coughing with a lot of phlem, notes now having headaches in the last two weeks as well   Hand Pain    Pt notes pain in the ring finger of her Rt hand theres a small bump on the 3rd knuckle that has caused radiating pain into her 2nd knuckle     HPI Nicole Reyes presents for multiple concerns above  Dizziness/lightheadedness  episodic lightheadedness discussed at May 14th visit.  History of asthma with increased cough at that time, discolored phlegm, started on azithromycin  for possible lower respiratory tract infection, recommended consistent use of Wixela and Flonase .  Albuterol  if needed.  Initially held off on imaging given reassuring exam.  EKG reassuring at that visit.  Not orthostatic.  Lab work ordered including CBC, c-Met, lipid panel, TSH, not performed. Unknown reason.   Since last visit feels like worse. Some nausea, now with vomiting at times - every other day. Tolerating fruit. Ate a slice of pizza yesterday, vomited.  Intake today - ginger ale. Drinking water . Couging causes some abd pain at times. Coughing causes the lightheadedness, and the shortness of breath - neither of these symptoms with usual activities.    Some nausea after eating and then vomiting, also after coughing fit.  Able to drink fluids. Normal UOP.  No fever.  Still with frequent cough- yellow phlegm in throat.  Took Zpak. Felt better for awhile then worsened after taking RSV vaccine about a week after last visit.  Has not been using flonase  - ran out a month ago - has not refilled.  Slight HA today. Not worst HA of life. Coughing causes HA. R frontal HA, comes and goes.  No recent use of albuterol . Has been consistently using Wixela.  Denies dysuria, urinary frequency or difficulty with urination.   No current abdominal pain. Tx: alleve 2 days ago. Mucinex  - minimal change.   No prior abdominal surgeries.   Right hand pain Bump on outside of 4th finger for a few months.  R hand dominant, NKI.  Tx: none.   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   History of arthroplasty of right knee 06/11/2020   Acute otitis media 01/08/2019   Sore throat 01/08/2019   Osteoarthritis of right knee 08/31/2018   Right knee DJD 08/31/2018   Allergic reaction 05/23/2018   Allergic urticaria 05/23/2018   Perennial and seasonal allergic rhinitis 05/23/2018   Allergic conjunctivitis 05/23/2018   Food allergy 05/23/2018   Carpal tunnel syndrome of left wrist 01/25/2018   Lattice degeneration 11/20/2017   Dry eye syndrome 11/20/2017   Cataracts, bilateral 11/20/2017   Chronic right SI joint pain 01/28/2016   Nut allergy 01/28/2016   Obesity 01/25/2015   Pure hypercholesterolemia 02/10/2014   Moderate persistent asthma 12/19/2012   Hypertensive disorder 12/19/2012   Past Medical History:  Diagnosis Date   Allergy    Anxiety    Arthritis    Asthma    Glaucoma    Hypertension    Past Surgical History:  Procedure Laterality  Date   COLONOSCOPY  06/29/2011   Normal   COLONOSCOPY&ENDOSCOPY  08/15/1979   PT UNSURE PLACE&MD WHO DID EXAMS=NORMAL   DILATION AND CURETTAGE OF UTERUS     LAPAROSCOPY     REMOVED SCARE TISSUE FROM OVARIES   ROTATOR CUFF REPAIR  04/24/2022   TOTAL KNEE ARTHROPLASTY Right 08/31/2018   Procedure: RIGHT  TOTAL KNEE ARTHROPLASTY;  Surgeon: Duwayne Purchase, MD;  Location: [facility];  Service: Orthopedics;  Laterality: Right;   TOTAL KNEE ARTHROPLASTY Left 10/02/2020   Procedure: TOTAL KNEE ARTHROPLASTY;  Surgeon: Duwayne Purchase, MD;  Location:  [facility];  Service: Orthopedics;  Laterality: Left;  2.5 hrs   Allergies  Allergen Reactions   Peanut-Containing Drug Products Anaphylaxis    Also walnuts and almonds    Shellfish Allergy Hives   Cephalexin Rash   Prior to Admission medications   Medication Sig Start Date End Date Taking? Authorizing Provider  albuterol  (VENTOLIN  HFA) 108 (90 Base) MCG/ACT inhaler INHALE 2 PUFFS BY MOUTH EVERY 6 HOURS AS NEEDED FOR WHEEZING 06/11/23  Yes Levora Purchase SAUNDERS, MD  amLODipine  (NORVASC ) 10 MG tablet TAKE 1 TABLET BY MOUTH EVERY DAY 03/06/24  Yes Levora Purchase SAUNDERS, MD  aspirin  EC 81 MG tablet Take 1 tablet (81 mg total) by mouth in the morning and at bedtime. Swallow whole. 10/04/20  Yes Bissell, Jaclyn M, PA-C  atorvastatin  (LIPITOR) 10 MG tablet TAKE 1 TABLET (10 MG TOTAL) BY MOUTH DAILY. CAN RESTART ONCE PER WEEK, INCREASE AS TOLERATED TO DAILY. 03/06/24  Yes Levora Purchase SAUNDERS, MD  cholecalciferol (VITAMIN D3) 25 MCG (1000 UNIT) tablet Take 1,000 Units by mouth daily.   Yes [provider]  EPINEPHrine  0.3 mg/0.3 mL IJ SOAJ injection Inject 0.3 mg into the muscle as needed for anaphylaxis. 04/26/24  Yes Levora Purchase SAUNDERS, MD  fluticasone  (FLONASE ) 50 MCG/ACT nasal spray PLACE 2 SPRAYS INTO BOTH NOSTRILS DAILY AS NEEDED (ALLERGIES.). 12/16/22  Yes Levora Purchase SAUNDERS, MD  fluticasone -salmeterol (WIXELA INHUB) 250-50 MCG/ACT AEPB Inhale 1 puff into the lungs 2 (two) times daily. in the morning and at bedtime. 04/26/24  Yes Levora Purchase SAUNDERS, MD  hydrOXYzine  (VISTARIL ) 25 MG capsule Take 1 capsule (25 mg total) by mouth every 8 (eight) hours as needed. 08/02/23  Yes Levora Purchase SAUNDERS, MD  lisinopril -hydrochlorothiazide  (ZESTORETIC ) 20-25 MG tablet TAKE 1 TABLET BY MOUTH EVERY DAY 03/06/24  Yes Levora Purchase SAUNDERS, MD  sertraline  (ZOLOFT ) 50 MG tablet TAKE 1 TABLET BY MOUTH DAILY. WITH FOOD. 04/17/24  Yes Levora Purchase SAUNDERS, MD   Social History   Socioeconomic History   Marital status: Single    Spouse  name: n/a   Number of children: 0   Years of education: Not on file   Highest education level: Not on file  Occupational History   Occupation: employed    Employer: [employer]  Tobacco Use   Smoking status: Never   Smokeless tobacco: Never  Vaping Use   Vaping status: Never Used  Substance and Sexual Activity   Alcohol use: No    Alcohol/week: 0.0 standard drinks of alcohol   Drug use: No   Sexual activity: Yes    Birth control/protection: Post-menopausal  Other Topics Concern   Not on file  Social History Narrative   Marital status:  Single; not dating      Children: none      Lives: alone      Employment:  Copywriter, [employer]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      Tobacco:  none      Alcohol: none      Exercise:  Four days per week.   Social Drivers of Corporate investment banker Strain: Not on file  Food Insecurity: Not on file  Transportation Needs: Not on file  Physical Activity: Not on file  Stress: Not on file  Social Connections: Not on file  Intimate Partner Violence: Not on file    Review of Systems Per HPI  Objective:   Vitals:   06/07/24 1456  BP: 122/64  Pulse: 74  Resp: 14  Temp: 98.1 F (36.7 C)  TempSrc: Temporal  SpO2: 96%  Weight: 242 lb 6.4 oz (110 kg)  Height: 5' 5 (1.651 m)    Physical Exam Vitals reviewed.  Constitutional:      General: She is not in acute distress.    Appearance: She is well-developed.  HENT:     Head: Normocephalic and atraumatic.     Right Ear: Hearing, tympanic membrane, ear canal and external ear normal.     Left Ear: Hearing, tympanic membrane, ear canal and external ear normal.     Nose: Nose normal.     Comments: Bilateral edematous turbinates, slight decreased airflow on the right with obstruction of the left nare.  No active discharge.    Mouth/Throat:     Pharynx: No posterior oropharyngeal erythema.   Eyes:     Conjunctiva/sclera: Conjunctivae normal.     Pupils: Pupils are equal, round, and reactive to light.     Cardiovascular:     Rate and Rhythm: Normal rate and regular rhythm.     Heart sounds: Normal heart sounds. No murmur heard. Pulmonary:     Effort: Pulmonary effort is normal. No respiratory distress.     Breath sounds: Normal breath sounds. No stridor. No wheezing or rhonchi.     Comments: Speaking in full sentences without respiratory distress, nontoxic appearance. Abdominal:     General: Abdomen is flat. There is no distension.     Palpations: Abdomen is soft.     Tenderness: There is no abdominal tenderness. There is no guarding.     Comments: Negative McBurney's, negative Murphy's, abdomen soft, nontender, nondistended.   Skin:    General: Skin is warm and dry.     Findings: No rash.   Neurological:     Mental Status: She is alert and oriented to person, place, and time.   Psychiatric:        Mood and Affect: Mood normal.        Behavior: Behavior normal.      Assessment & Plan:  Tabita Corbo is a 68 y.o. female . Subacute cough - Plan: benzonatate  (TESSALON ) 100 MG capsule, fluticasone  (FLONASE ) 50 MCG/ACT nasal spray, CBC, DG Chest 2 View, ipratropium (ATROVENT ) 0.06 % nasal spray Episodic lightheadedness - Plan: CBC Nausea Post-tussive emesis Intermittent abdominal pain - Plan: CBC, Comprehensive metabolic panel with GFR, Lipase  - Persistent cough with likely posttussive emesis, reports abdominal pain, nausea with coughing episodes along with the episodic lightheadedness, denies dyspnea with exertion or lightheadedness with exertion.  Lungs were clear.  Reassuring vital signs.  Prior improvement azithromycin  and then recurrence, unsure if this could have been related to vaccine or recurrence of prior cough.  Abdomen is nontender.  Denies any dysuria/urinary symptoms.  Suspect some of her cough may be related to postnasal drip as above.  - Restart Flonase , with option to add Atrovent  nasal spray, potential side effects discussed  - Continue Wixela.  Albuterol   if needed for breakthrough wheezing or dyspnea.  Hold on prednisone  for now but depending on cough into next week consider short course of prednisone .  - Add Tessalon  Perles, check chest x-ray.  - Check CMP, CBC, lipase with abdominal pain, nausea and vomiting but reassuring exam in office and likely due to cough as above.  - ER/RTC precautions given, recheck 1 week.  Right hand pain - Plan: DG Finger Ring Right  - With nodular appearance of DIP, suspected osteoarthritis.  Check imaging, topical treatments discussed as option for now.  Can follow-up at short interval appointment as above.  Meds ordered this encounter  Medications   benzonatate  (TESSALON ) 100 MG capsule    Sig: Take 1 capsule (100 mg total) by mouth 3 (three) times daily as needed for cough.    Dispense:  20 capsule    Refill:  0   fluticasone  (FLONASE ) 50 MCG/ACT nasal spray    Sig: Place 2 sprays into both nostrils daily as needed (allergies.).    Dispense:  48 mL    Refill:  1   ipratropium (ATROVENT ) 0.06 % nasal spray    Sig: Place 1 spray into both nostrils 4 (four) times daily. As needed for nasal congestion    Dispense:  15 mL    Refill:  0   Patient Instructions  I am sorry to hear that you are not feeling significantly better from your last visit.  I am encouraged that the antibiotic temporarily helped, but it sounds like the cough has returned.  Some of the nausea and vomiting could be what we call posttussive emesis or vomiting from coughing fits.  Please have x-rays performed tomorrow if possible to look at a chest x-ray and your hand x-ray (which could be arthritis, okay to use Voltaren gel over-the-counter for the finger pain for now).   Flonase  nasal spray was refilled, try 1 to 2 sprays in each nostril once per day to help with some congestion which may be causing some postnasal drip and triggering the cough. If that does not help with the drainage can try ipratropium nasal spray I sent today as well.    Tessalon  Perles can be taken up to 3 times a day to help block the cough. Continue the fluticasone  salmeterol inhaler for asthma.  If you are having any wheezing or shortness of breath can use albuterol  inhaler.  Let me know if you have to use the albuterol  as we may have different options to treat cough at that time. I would consider prednisone  (steroid) if not improving in next week.  If I see any concerns on your x-ray I will let you know.  For the nausea and vomiting, again that could be related to your cough.  Start Flonase , Tessalon  Perles as above.  I am checking some blood work today but less concerned since you are not having any abdominal pain on your exam in the office.  Make sure to continue drink plenty of fluids, small meals, bland foods for now and if nausea is not improving within the next 4 to 5 days, let me know.  Be seen sooner if any new or worsening symptoms.  I would like to recheck with you in 1 week to make sure things are improving, again be seen sooner if worsening.  Hang in there!       Signed,   Reyes [place], MD [facility], [facility] 06/07/24 4:13 PM

## 2024-06-07 NOTE — Patient Instructions (Addendum)
 I am sorry to hear that you are not feeling significantly better from your last visit.  I am encouraged that the antibiotic temporarily helped, but it sounds like the cough has returned.  Some of the nausea and vomiting could be what we call posttussive emesis or vomiting from coughing fits.  Please have x-rays performed tomorrow if possible to look at a chest x-ray and your hand x-ray (which could be arthritis, okay to use Voltaren gel over-the-counter for the finger pain for now).   Flonase  nasal spray was refilled, try 1 to 2 sprays in each nostril once per day to help with some congestion which may be causing some postnasal drip and triggering the cough. If that does not help with the drainage can try ipratropium nasal spray I sent today as well.   Tessalon  Perles can be taken up to 3 times a day to help block the cough. Continue the fluticasone  salmeterol inhaler for asthma.  If you are having any wheezing or shortness of breath can use albuterol  inhaler.  Let me know if you have to use the albuterol  as we may have different options to treat cough at that time. I would consider prednisone  (steroid) if not improving in next week.  If I see any concerns on your x-ray I will let you know.  For the nausea and vomiting, again that could be related to your cough.  Start Flonase , Tessalon  Perles as above.  I am checking some blood work today but less concerned since you are not having any abdominal pain on your exam in the office.  Make sure to continue drink plenty of fluids, small meals, bland foods for now and if nausea is not improving within the next 4 to 5 days, let me know.  Be seen sooner if any new or worsening symptoms.  I would like to recheck with you in 1 week to make sure things are improving, again be seen sooner if worsening.  Hang in there!

## 2024-06-08 ENCOUNTER — Ambulatory Visit: Payer: Self-pay | Admitting: Family Medicine

## 2024-06-08 ENCOUNTER — Other Ambulatory Visit

## 2024-06-08 DIAGNOSIS — R1013 Epigastric pain: Secondary | ICD-10-CM

## 2024-06-08 DIAGNOSIS — R748 Abnormal levels of other serum enzymes: Secondary | ICD-10-CM

## 2024-06-08 DIAGNOSIS — R110 Nausea: Secondary | ICD-10-CM

## 2024-06-08 LAB — CBC
HCT: 43.3 % (ref 36.0–46.0)
Hemoglobin: 14.3 g/dL (ref 12.0–15.0)
MCHC: 32.9 g/dL (ref 30.0–36.0)
MCV: 80.3 fl (ref 78.0–100.0)
Platelets: 291 10*3/uL (ref 150.0–400.0)
RBC: 5.39 Mil/uL — ABNORMAL HIGH (ref 3.87–5.11)
RDW: 13.6 % (ref 11.5–15.5)
WBC: 5.7 10*3/uL (ref 4.0–10.5)

## 2024-06-08 LAB — COMPREHENSIVE METABOLIC PANEL WITH GFR
ALT: 29 U/L (ref 0–35)
AST: 32 U/L (ref 0–37)
Albumin: 4.8 g/dL (ref 3.5–5.2)
Alkaline Phosphatase: 94 U/L (ref 39–117)
BUN: 16 mg/dL (ref 6–23)
CO2: 28 meq/L (ref 19–32)
Calcium: 9.9 mg/dL (ref 8.4–10.5)
Chloride: 102 meq/L (ref 96–112)
Creatinine, Ser: 0.89 mg/dL (ref 0.40–1.20)
GFR: 67.02 mL/min (ref >60.00)
Glucose, Bld: 116 mg/dL — ABNORMAL HIGH (ref 70–99)
Potassium: 4.1 meq/L (ref 3.5–5.1)
Sodium: 137 meq/L (ref 135–145)
Total Bilirubin: 0.6 mg/dL (ref 0.2–1.2)
Total Protein: 8.1 g/dL (ref 6.0–8.3)

## 2024-06-08 LAB — LIPASE: Lipase: 85 U/L — ABNORMAL HIGH (ref 11.0–59.0)

## 2024-06-14 ENCOUNTER — Ambulatory Visit: Admitting: Family Medicine

## 2024-06-14 ENCOUNTER — Ambulatory Visit: Payer: Self-pay | Admitting: Family Medicine

## 2024-06-14 VITALS — BP 126/70 | HR 77 | Temp 98.3°F | Resp 16 | Ht 65.0 in | Wt 244.4 lb

## 2024-06-14 DIAGNOSIS — R1013 Epigastric pain: Secondary | ICD-10-CM | POA: Diagnosis not present

## 2024-06-14 DIAGNOSIS — R052 Subacute cough: Secondary | ICD-10-CM

## 2024-06-14 DIAGNOSIS — R110 Nausea: Secondary | ICD-10-CM

## 2024-06-14 DIAGNOSIS — R748 Abnormal levels of other serum enzymes: Secondary | ICD-10-CM

## 2024-06-14 LAB — COMPREHENSIVE METABOLIC PANEL WITH GFR
ALT: 29 U/L (ref 0–35)
AST: 29 U/L (ref 0–37)
Albumin: 4.8 g/dL (ref 3.5–5.2)
Alkaline Phosphatase: 95 U/L (ref 39–117)
BUN: 17 mg/dL (ref 6–23)
CO2: 28 meq/L (ref 19–32)
Calcium: 9.6 mg/dL (ref 8.4–10.5)
Chloride: 102 meq/L (ref 96–112)
Creatinine, Ser: 0.84 mg/dL (ref 0.40–1.20)
GFR: 71.82 mL/min (ref >60.00)
Glucose, Bld: 119 mg/dL — ABNORMAL HIGH (ref 70–99)
Potassium: 3.4 meq/L — ABNORMAL LOW (ref 3.5–5.1)
Sodium: 139 meq/L (ref 135–145)
Total Bilirubin: 0.6 mg/dL (ref 0.2–1.2)
Total Protein: 8.2 g/dL (ref 6.0–8.3)

## 2024-06-14 LAB — CBC
HCT: 41.1 % (ref 36.0–46.0)
Hemoglobin: 13.5 g/dL (ref 12.0–15.0)
MCHC: 32.8 g/dL (ref 30.0–36.0)
MCV: 80.4 fl (ref 78.0–100.0)
Platelets: 264 10*3/uL (ref 150.0–400.0)
RBC: 5.12 Mil/uL — ABNORMAL HIGH (ref 3.87–5.11)
RDW: 13.8 % (ref 11.5–15.5)
WBC: 6.2 10*3/uL (ref 4.0–10.5)

## 2024-06-14 LAB — LIPASE: Lipase: 96 U/L — ABNORMAL HIGH (ref 11.0–59.0)

## 2024-06-14 NOTE — Patient Instructions (Signed)
 Glad to hear that the cough has improved.  Okay to continue the same nasal sprays, Tessalon  Perles as needed and if the cough does not continue to improve through the weekend in the next week, I do recommend a chest x-ray, that has already been ordered for the [facility] location below and let me know if that cough is not improving.  I am still concerned about the vomiting over the past week and abdominal discomfort although glad to hear that has improved last night into today.  I am checking some labs today and if the pancreas test is still elevated we will likely have you do the CT scan either today or tomorrow.  We will let you know.  If any new or worsening symptoms let me know.  [facility] or xray: Walk in 8:30-4:30 during weekdays, no appointment needed [address]  [place], [place] [id number]

## 2024-06-14 NOTE — Progress Notes (Signed)
 Subjective:  Patient ID: Nicole Reyes, female    DOB: July 26, 1956  Age: 68 y.o. MRN: [id number]  CC:  Chief Complaint  Patient presents with   Follow-up    Patient notes no questions, is doing better today    HPI Nicole Reyes presents for  Follow-up from 06/07/2024.  Cough with emesis, intermittent abdominal pain, nausea, episodic lightheadedness.  Lungs were clear at that time with reassuring vital signs.  Prior improvement after azithromycin  and then recurrence of symptoms, question if those were side effects related to vaccine administration versus recurrence of prior cough.  Cough thought to be in part due to postnasal drip.  Emesis thought to be primarily posttussive emesis restarted Flonase , option to add Atrovent  nasal spray.  Continued Wixela with albuterol  if needed, added Tessalon  Perles, chest x-ray ordered as well as blood work. Glucose 116 but otherwise reassuring CMP.  CBC normal, with borderline elevated RBC only.  WBC 5.7.  Lipase was elevated 85, ordered CT abdomen pelvis, abdomen was nontender last visit. Appears CT is scheduled on July 8.  Fleeing a little but better. Using flonase  and atrovent  NS. Still some cough, but better. No fever. Using tessalon  perles.  Minimal upper abdominal soreness on right side. Had been vomiting past week - able to keep food down yesterday. No vomiting today.  Prior vomiting after eating, not with coughing.  Abd pain had worsened then better since yesterday.  Lightheadedness has improved.  Ate 2 cookies today. Able to eat some past last night - that stayed down.  No alcohol.      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   History of arthroplasty of right knee 06/11/2020   Acute otitis media  01/08/2019   Sore throat 01/08/2019   Osteoarthritis of right knee 08/31/2018   Right knee DJD 08/31/2018   Allergic reaction 05/23/2018   Allergic urticaria 05/23/2018   Perennial and seasonal allergic rhinitis 05/23/2018   Allergic conjunctivitis 05/23/2018   Food allergy 05/23/2018   Carpal tunnel syndrome of left wrist 01/25/2018   Lattice degeneration 11/20/2017   Dry eye syndrome 11/20/2017   Cataracts, bilateral 11/20/2017   Chronic right SI joint pain 01/28/2016   Nut allergy 01/28/2016   Obesity 01/25/2015   Pure hypercholesterolemia 02/10/2014   Moderate persistent asthma 12/19/2012   Hypertensive disorder 12/19/2012   Past Medical History:  Diagnosis Date   Allergy    Anxiety    Arthritis    Asthma    Glaucoma    Hypertension    Past Surgical History:  Procedure Laterality Date   COLONOSCOPY  06/29/2011   Normal   COLONOSCOPY&ENDOSCOPY  08/15/1979   PT UNSURE PLACE&MD WHO DID EXAMS=NORMAL   DILATION AND CURETTAGE OF UTERUS     LAPAROSCOPY     REMOVED SCARE TISSUE FROM OVARIES   ROTATOR CUFF REPAIR  04/24/2022   TOTAL KNEE ARTHROPLASTY Right 08/31/2018   Procedure: RIGHT  TOTAL KNEE ARTHROPLASTY;  Surgeon: Duwayne Purchase, MD;  Location: [facility];  Service: Orthopedics;  Laterality: Right;   TOTAL KNEE ARTHROPLASTY Left 10/02/2020   Procedure: TOTAL KNEE ARTHROPLASTY;  Surgeon: Duwayne Purchase, MD;  Location: [facility];  Service: Orthopedics;  Laterality: Left;  2.5 hrs   Allergies  Allergen Reactions  Peanut-Containing Drug Products Anaphylaxis    Also walnuts and almonds    Shellfish Allergy Hives   Cephalexin Rash   Prior to Admission medications   Medication Sig Start Date End Date Taking? Authorizing Provider  albuterol  (VENTOLIN  HFA) 108 (90 Base) MCG/ACT inhaler INHALE 2 PUFFS BY MOUTH EVERY 6 HOURS AS NEEDED FOR WHEEZING 06/11/23  Yes [name], MD  amLODipine  (NORVASC ) 10 MG tablet TAKE 1 TABLET BY MOUTH EVERY DAY 03/06/24  Yes [name], MD  aspirin  EC 81 MG tablet Take 1 tablet (81 mg total) by mouth in the morning and at bedtime. Swallow whole. 10/04/20  Yes Bissell, Jaclyn M, PA-C  atorvastatin  (LIPITOR) 10 MG tablet TAKE 1 TABLET (10 MG TOTAL) BY MOUTH DAILY. CAN RESTART ONCE PER WEEK, INCREASE AS TOLERATED TO DAILY. 03/06/24  Yes [name], MD  benzonatate  (TESSALON ) 100 MG capsule Take 1 capsule (100 mg total) by mouth 3 (three) times daily as needed for cough. 06/07/24  Yes [name], MD  cholecalciferol (VITAMIN D3) 25 MCG (1000 UNIT) tablet Take 1,000 Units by mouth daily.   Yes [provider]  EPINEPHrine  0.3 mg/0.3 mL IJ SOAJ injection Inject 0.3 mg into the muscle as needed for anaphylaxis. 04/26/24  Yes [name], MD  fluticasone  (FLONASE ) 50 MCG/ACT nasal spray Place 2 sprays into both nostrils daily as needed (allergies.). 06/07/24  Yes [name], MD  fluticasone -salmeterol (WIXELA INHUB) 250-50 MCG/ACT AEPB Inhale 1 puff into the lungs 2 (two) times daily. in the morning and at bedtime. 04/26/24  Yes [name], MD  hydrOXYzine  (VISTARIL ) 25 MG capsule Take 1 capsule (25 mg total) by mouth every 8 (eight) hours as needed. 08/02/23  Yes [name], MD  ipratropium (ATROVENT ) 0.06 % nasal spray Place 1 spray into both nostrils 4 (four) times daily. As needed for nasal congestion 06/07/24  Yes [name], MD  lisinopril -hydrochlorothiazide  (ZESTORETIC ) 20-25 MG tablet TAKE 1 TABLET BY MOUTH EVERY DAY 03/06/24  Yes [name], MD  sertraline  (ZOLOFT ) 50 MG tablet TAKE 1 TABLET BY MOUTH DAILY. WITH FOOD. 04/17/24  Yes [name], MD   Social History   Socioeconomic History   Marital status: Single    Spouse name: n/a   Number of children: 0   Years of education: Not on file   Highest education level: Not on file  Occupational History   Occupation: employed    Employer: [employer]  Tobacco Use   Smoking status: Never   Smokeless tobacco:  Never  Vaping Use   Vaping status: Never Used  Substance and Sexual Activity   Alcohol use: No    Alcohol/week: 0.0 standard drinks of alcohol   Drug use: No   Sexual activity: Yes    Birth control/protection: Post-menopausal  Other Topics Concern   Not on file  Social History Narrative   Marital status:  Single; not dating      Children: none      Lives: alone      Employment:  Copywriter, [employer]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      Tobacco: none      Alcohol: none      Exercise:  Four days per week.   Social Drivers of Corporate investment banker Strain: Not on file  Food Insecurity: Not on file  Transportation Needs: Not on file  Physical Activity: Not on file  Stress: Not on file  Social Connections: Not on  file  Intimate Partner Violence: Not on file    Review of Systems   Objective:   Vitals:   06/14/24 1304  BP: 126/70  Pulse: 77  Resp: 16  Temp: 98.3 F (36.8 C)  TempSrc: Temporal  SpO2: 97%  Weight: 244 lb 6.4 oz (110.9 kg)  Height: 5' 5 (1.651 m)     Physical Exam Vitals reviewed.  Constitutional:      Appearance: Normal appearance. She is well-developed.  HENT:     Head: Normocephalic and atraumatic.  Eyes:     Conjunctiva/sclera: Conjunctivae normal.     Pupils: Pupils are equal, round, and reactive to light.  Neck:     Vascular: No carotid bruit.  Cardiovascular:     Rate and Rhythm: Normal rate and regular rhythm.     Heart sounds: Normal heart sounds.  Pulmonary:     Effort: Pulmonary effort is normal. No respiratory distress.     Breath sounds: Normal breath sounds. No stridor. No wheezing, rhonchi or rales.  Abdominal:     General: There is no distension.     Palpations: Abdomen is soft. There is no pulsatile mass.     Tenderness: There is abdominal tenderness (Minimal epigastric discomfort, left upper quadrant discomfort, but no rebound or guarding.).  Musculoskeletal:     Right lower leg: No edema.     Left lower leg: No edema.  Skin:     General: Skin is warm and dry.  Neurological:     Mental Status: She is alert and oriented to person, place, and time.  Psychiatric:        Mood and Affect: Mood normal.        Behavior: Behavior normal.        Assessment & Plan:  Nicole Reyes is a 68 y.o. female . Elevated lipase - Plan: Lipase, CBC, Comprehensive metabolic panel with GFR  Epigastric abdominal pain - Plan: Lipase, CBC, Comprehensive metabolic panel with GFR  Subacute cough  Nausea - Plan: Lipase, CBC, Comprehensive metabolic panel with GFR  Cough is improved.  Lungs clear.  Continue symptomatic care and if cough not improving through the weekend recommended proceeding with chest x-ray that was ordered at [facility].  RTC precautions.  In regards to the persistent vomiting, repeat lipase, CMP, CBC ordered.  Persistent elevated lipase, CT abdomen pelvis ordered previously, but was not scheduled to be performed until later next week.  This was moved up and obtained July 3, no sign of acute pancreatitis.  Updated symptoms on phone call on the third, and she was feeling better.  Plan for repeat lab test next week for lipase, and if persistent elevation will refer to gastroenterology.  ER precautions given over the weekend if any new or worsening symptoms.  No orders of the defined types were placed in this encounter.  Patient Instructions  Glad to hear that the cough has improved.  Okay to continue the same nasal sprays, Tessalon  Perles as needed and if the cough does not continue to improve through the weekend in the next week, I do recommend a chest x-ray, that has already been ordered for the [facility] location below and let me know if that cough is not improving.  I am still concerned about the vomiting over the past week and abdominal discomfort although glad to hear that has improved last night into today.  I am checking some labs today and if the pancreas test is still elevated we will likely have you  do  the CT scan either today or tomorrow.  We will let you know.  If any new or worsening symptoms let me know.  [facility] or xray: Walk in 8:30-4:30 during weekdays, no appointment needed [address]  [place], [place] [id number]     Signed,   Reyes Pines, MD [facility], [facility] 06/14/24 1:34 PM

## 2024-06-15 ENCOUNTER — Ambulatory Visit
Admission: RE | Admit: 2024-06-15 | Discharge: 2024-06-15 | Disposition: A | Discharge status: Home / Self Care | Source: Ambulatory Visit | Attending: Family Medicine | Admitting: Family Medicine

## 2024-06-15 ENCOUNTER — Ambulatory Visit: Payer: Self-pay | Admitting: Family Medicine

## 2024-06-15 DIAGNOSIS — D259 Leiomyoma of uterus, unspecified: Secondary | ICD-10-CM | POA: Diagnosis not present

## 2024-06-15 DIAGNOSIS — R1013 Epigastric pain: Secondary | ICD-10-CM

## 2024-06-15 DIAGNOSIS — R748 Abnormal levels of other serum enzymes: Secondary | ICD-10-CM

## 2024-06-15 DIAGNOSIS — K760 Fatty (change of) liver, not elsewhere classified: Secondary | ICD-10-CM | POA: Diagnosis not present

## 2024-06-15 DIAGNOSIS — R110 Nausea: Secondary | ICD-10-CM

## 2024-06-15 MED ORDER — IOPAMIDOL (ISOVUE-300) INJECTION 61%
100.0000 mL | Freq: Once | INTRAVENOUS | Status: AC | PRN
  Administered 2024-06-15: 100 mL via INTRAVENOUS

## 2024-06-15 NOTE — Telephone Encounter (Signed)
 Per Tobias: Patient called me back and agreed to go today arriving at 10:30.

## 2024-06-15 NOTE — Telephone Encounter (Signed)
-----   Message from Nicole Nicole sent at 06/14/2024  6:02 PM EDT ----- Results sent by MyChart, please have CT abdomen moved to Thursday, July 3 for nausea, vomiting, abdominal pain and elevated lipase, rule out pancreatitis.  Currently that is scheduled for next week,  but needs to be done tomorrow (7/3) if possible.  Thanks.   ----- Message ----- From: Interface, Lab In Three [facility] One Sent: 06/14/2024   3:21 PM EDT To: Nicole JONELLE Pines, MD

## 2024-06-15 NOTE — Telephone Encounter (Signed)
 Attempted to call patient, had to leave a vm. Asked that she give me a call back to let her know that I have reached out to [facility] about getting her in today for that CT instead of 7/8.

## 2024-06-15 NOTE — Progress Notes (Signed)
 Per referral coordinator, Tobias, Appt today at 1:00 but she has to arrive 10:30 to check in and has to drink the contrast 2 hours prior. I called the patient and left a voicemail. I will try her again in a few minutes

## 2024-06-16 ENCOUNTER — Encounter: Payer: Self-pay | Admitting: Family Medicine

## 2024-06-20 ENCOUNTER — Other Ambulatory Visit

## 2024-06-22 ENCOUNTER — Other Ambulatory Visit (INDEPENDENT_AMBULATORY_CARE_PROVIDER_SITE_OTHER)

## 2024-06-22 DIAGNOSIS — R748 Abnormal levels of other serum enzymes: Secondary | ICD-10-CM | POA: Diagnosis not present

## 2024-06-22 LAB — LIPASE: Lipase: 115 U/L — ABNORMAL HIGH (ref 11.0–59.0)

## 2024-06-23 ENCOUNTER — Ambulatory Visit: Payer: Self-pay | Admitting: Family Medicine

## 2024-06-23 DIAGNOSIS — R1013 Epigastric pain: Secondary | ICD-10-CM

## 2024-06-23 DIAGNOSIS — R110 Nausea: Secondary | ICD-10-CM

## 2024-06-23 DIAGNOSIS — R748 Abnormal levels of other serum enzymes: Secondary | ICD-10-CM

## 2024-06-27 NOTE — Telephone Encounter (Signed)
 Patient called back and was informed, there are no concerns from her she agrees to the referral.

## 2024-06-27 NOTE — Telephone Encounter (Signed)
 Referral placed  Thanks!

## 2024-06-29 ENCOUNTER — Other Ambulatory Visit: Payer: Self-pay | Admitting: Family Medicine

## 2024-06-29 DIAGNOSIS — R052 Subacute cough: Secondary | ICD-10-CM

## 2024-06-29 NOTE — Telephone Encounter (Signed)
 Patient requesting change to Atrovent  nasal spray. She is wanting a 90 day supply?

## 2024-06-29 NOTE — Telephone Encounter (Signed)
 Ordered ipratropium nasal spray 1-month supply.

## 2024-07-10 ENCOUNTER — Other Ambulatory Visit: Payer: Self-pay | Admitting: Family Medicine

## 2024-07-10 DIAGNOSIS — Z1231 Encounter for screening mammogram for malignant neoplasm of breast: Secondary | ICD-10-CM

## 2024-07-22 ENCOUNTER — Other Ambulatory Visit: Payer: Self-pay | Admitting: Family Medicine

## 2024-07-22 DIAGNOSIS — R052 Subacute cough: Secondary | ICD-10-CM

## 2024-07-31 ENCOUNTER — Ambulatory Visit
Admission: RE | Admit: 2024-07-31 | Discharge: 2024-07-31 | Disposition: A | Discharge status: Home / Self Care | Source: Ambulatory Visit | Attending: Family Medicine | Admitting: Family Medicine

## 2024-07-31 DIAGNOSIS — Z1231 Encounter for screening mammogram for malignant neoplasm of breast: Secondary | ICD-10-CM

## 2024-08-04 ENCOUNTER — Ambulatory Visit: Payer: Self-pay | Admitting: Gastroenterology

## 2024-08-04 ENCOUNTER — Other Ambulatory Visit

## 2024-08-04 ENCOUNTER — Ambulatory Visit (HOSPITAL_COMMUNITY)
Admission: RE | Admit: 2024-08-04 | Discharge: 2024-08-04 | Disposition: A | Discharge status: Home / Self Care | Source: Ambulatory Visit | Attending: Gastroenterology | Admitting: Gastroenterology

## 2024-08-04 ENCOUNTER — Encounter: Payer: Self-pay | Admitting: Gastroenterology

## 2024-08-04 ENCOUNTER — Ambulatory Visit: Admitting: Gastroenterology

## 2024-08-04 VITALS — BP 132/84 | HR 84 | Ht 65.0 in | Wt 248.6 lb

## 2024-08-04 DIAGNOSIS — R748 Abnormal levels of other serum enzymes: Secondary | ICD-10-CM | POA: Diagnosis not present

## 2024-08-04 DIAGNOSIS — K219 Gastro-esophageal reflux disease without esophagitis: Secondary | ICD-10-CM

## 2024-08-04 DIAGNOSIS — R1010 Upper abdominal pain, unspecified: Secondary | ICD-10-CM | POA: Insufficient documentation

## 2024-08-04 DIAGNOSIS — R112 Nausea with vomiting, unspecified: Secondary | ICD-10-CM | POA: Insufficient documentation

## 2024-08-04 DIAGNOSIS — R109 Unspecified abdominal pain: Secondary | ICD-10-CM | POA: Diagnosis not present

## 2024-08-04 LAB — COMPREHENSIVE METABOLIC PANEL WITH GFR
ALT: 32 U/L (ref 0–35)
AST: 29 U/L (ref 0–37)
Albumin: 4.4 g/dL (ref 3.5–5.2)
Alkaline Phosphatase: 105 U/L (ref 39–117)
BUN: 10 mg/dL (ref 6–23)
CO2: 28 meq/L (ref 19–32)
Calcium: 9.2 mg/dL (ref 8.4–10.5)
Chloride: 103 meq/L (ref 96–112)
Creatinine, Ser: 0.81 mg/dL (ref 0.40–1.20)
GFR: 74.95 mL/min (ref >60.00)
Glucose, Bld: 124 mg/dL — ABNORMAL HIGH (ref 70–99)
Potassium: 3.8 meq/L (ref 3.5–5.1)
Sodium: 141 meq/L (ref 135–145)
Total Bilirubin: 0.6 mg/dL (ref 0.2–1.2)
Total Protein: 7.7 g/dL (ref 6.0–8.3)

## 2024-08-04 LAB — CBC WITH DIFFERENTIAL/PLATELET
Basophils Absolute: 0.1 K/uL (ref 0.0–0.1)
Basophils Relative: 1.3 % (ref 0.0–3.0)
Eosinophils Absolute: 0.3 K/uL (ref 0.0–0.7)
Eosinophils Relative: 5.4 % — ABNORMAL HIGH (ref 0.0–5.0)
HCT: 40.3 % (ref 36.0–46.0)
Hemoglobin: 13.5 g/dL (ref 12.0–15.0)
Lymphocytes Relative: 33.7 % (ref 12.0–46.0)
Lymphs Abs: 1.9 K/uL (ref 0.7–4.0)
MCHC: 33.4 g/dL (ref 30.0–36.0)
MCV: 81.2 fl (ref 78.0–100.0)
Monocytes Absolute: 0.4 K/uL (ref 0.1–1.0)
Monocytes Relative: 7.9 % (ref 3.0–12.0)
Neutro Abs: 2.9 K/uL (ref 1.4–7.7)
Neutrophils Relative %: 51.7 % (ref 43.0–77.0)
Platelets: 239 K/uL (ref 150.0–400.0)
RBC: 4.96 Mil/uL (ref 3.87–5.11)
RDW: 13.8 % (ref 11.5–15.5)
WBC: 5.5 K/uL (ref 4.0–10.5)

## 2024-08-04 LAB — LIPASE: Lipase: 76 U/L — ABNORMAL HIGH (ref 11.0–59.0)

## 2024-08-04 MED ORDER — PANTOPRAZOLE SODIUM 40 MG PO TBEC: 40.0000 mg | DELAYED_RELEASE_TABLET | Freq: Every day | ORAL | 3 refills | Status: AC

## 2024-08-04 NOTE — Patient Instructions (Signed)
 Your provider has requested that you go to the basement level for lab work before leaving today. Press B on the elevator. The lab is located at the first door on the left as you exit the elevator.   You have been scheduled for an abdominal ultrasound at [facility] (1st floor of hospital) on 08/04/2024 TODAY at 4:00 pm. Please arrive 30 minutes prior to your appointment for registration. Make certain not to have anything to eat or drink 6 hours prior to your appointment. Should you need to reschedule your appointment, please contact radiology at [phone]. This test typically takes about 30 minutes to perform. You can eat until 10:00 am then nothing after 10 am    We have sent the following medications to your pharmacy for you to pick up at your convenience: Protonix  40 mg daily   Due to recent changes in healthcare laws, you may see the results of your imaging and laboratory studies on MyChart before your provider has had a chance to review them.  We understand that in some cases there may be results that are confusing or concerning to you. Not all laboratory results come back in the same time frame and the provider may be waiting for multiple results in order to interpret others.  Please give us  48 hours in order for your provider to thoroughly review all the results before contacting the office for clarification of your results.   _______________________________________________________  If your blood pressure at your visit was 140/90 or greater, please contact your primary care physician to follow up on this.  _______________________________________________________  If you are age 68 or older, your body mass index should be between 23-30. Your Body mass index is 41.37 kg/m. If this is out of the aforementioned range listed, please consider follow up with your Primary Care Provider.  If you are age 68 or younger, your body mass index should be between 19-25. Your Body mass index  is 41.37 kg/m. If this is out of the aformentioned range listed, please consider follow up with your Primary Care Provider.   ________________________________________________________  The [facility] providers would like to encourage you to use MYCHART to communicate with providers for non-urgent requests or questions.  Due to long hold times on the telephone, sending your provider a message by [facility] may be a faster and more efficient way to get a response.  Please allow 48 business hours for a response.  Please remember that this is for non-urgent requests.  _______________________________________________________  [facility] is using a team-based approach to care.  Your team is made up of your doctor and two to three APPS. Our APPS (Nurse Practitioners and Physician Assistants) work with your physician to ensure care continuity for you. They are fully qualified to address your health concerns and develop a treatment plan. They communicate directly with your gastroenterologist to care for you. Seeing the Advanced Practice Practitioners on your physician's team can help you by facilitating care more promptly, often allowing for earlier appointments, access to diagnostic testing, procedures, and other specialty referrals.    I appreciate the  opportunity to care for you  Thank You   Camie Heinz,PA-C

## 2024-08-04 NOTE — Progress Notes (Addendum)
 Nicole Reyes [id number] 1956-01-27   Chief Complaint: Abdominal pain, nausea  Referring Provider: [name], MD Primary GI MD: Dr. Avram  HPI: Nicole Reyes is a 68 y.o. female with past medical history of anxiety, arthritis, asthma, HTN who presents today for a complaint of abdominal pain and nausea.    Patient has had rising lipase level since June (85 --> 96 --> 115).  Referred by PCP for further evaluation.  Seen by PCP 06/14/2024.  Had previously been seen for cough with emesis, intermittent abdominal pain, nausea, episodic lightheadedness.  Lungs were clear at that time.  Cough thought to be in part due to postnasal drip.  Emesis stopped primarily posttussive emesis.  Lipase initially elevated at 85, CT A/P was ordered.   At follow-up 06/14/2024 was feeling a little better.  Had minimal upper abdominal soreness on the right side.  Had been vomiting over the past week.  Denied alcohol use.  Repeat lipase was 115. Labs 06/14/2024 otherwise showed normal liver and kidney function, no evidence of anemia or infection.  CT A/P 07/12/2024 showed mild fatty liver, a 5 cm right uterine fibroid, and otherwise unremarkable.   Patient states she has been feeling nauseated and is having intermittent vomiting after eating for about 2 to 3 months.  States that she spits up a lot of mucus.  Has to watch what she eats and has found that things like fried chicken, sandwiches, and solid causes worsening nausea and vomiting and she has been avoiding these.  Nausea comes and goes, and she does not have vomiting daily.    She does have daily abdominal pain which has become fairly constant and she rates it a 6/10 on pain scale.  States that sometimes she cannot sleep due to pain.  She has not been to the ED or urgent care for this.  States that pain is worse than it was last month when she saw her PCP.  Pain primarily localized to her RUQ and states that it feels hot on that side of her  abdomen.  States that she sometimes feels feverish at home but has not checked her temperature.  She reports that last week she had a sensation of acid reflux.  Has had symptoms intermittently for the last couple of months but denies any heartburn.  States that she had acid reflux maybe in the 1980s/1990s but has not had any problems in recent years until now.  She denies any dysphagia.  Denies any family history of gallbladder disease.  She typically has a bowel movement daily and denies any diarrhea, blood in her stools, black stools.  She has occasional constipation.  Denies chest pain.  Has some shortness of breath with asthma which is not new or worsening.  Denies weakness or dizziness.   Previous GI Procedures/Imaging   CT A/P 06/15/2024 1. No acute intra-abdominal or pelvic pathology. 2. Mild fatty liver. 3. A 5 cm right uterine fibroid. 4. Aortic Atherosclerosis (ICD10-I70.0).  Colonoscopy 08/26/2021 - One diminutive polyp in the proximal rectum, removed with a cold snare. Resected and retrieved.  - The examination was otherwise normal on direct and retroflexion views. - Recall 10 years Path: Surgical [P], colon, rectum, polyp (1) - COLONIC MUCOSA WITH POLYPOID INFLAMED GRANULATION TISSUE CONSISTENT WITH MUCOSAL PROLAPSE. - NO ADENOMATOUS CHANGE OR CARCINOMA.   Past Medical History:  Diagnosis Date   Allergy    Anxiety    Arthritis    Asthma    Glaucoma  Hypertension     Past Surgical History:  Procedure Laterality Date   COLONOSCOPY  06/29/2011   Normal   COLONOSCOPY&ENDOSCOPY  08/15/1979   PT UNSURE PLACE&MD WHO DID EXAMS=NORMAL   DILATION AND CURETTAGE OF UTERUS     LAPAROSCOPY     REMOVED SCARE TISSUE FROM OVARIES   ROTATOR CUFF REPAIR  04/24/2022   TOTAL KNEE ARTHROPLASTY Right 08/31/2018   Procedure: RIGHT  TOTAL KNEE ARTHROPLASTY;  Surgeon: Duwayne Purchase, MD;  Location: [facility];  Service: Orthopedics;  Laterality: Right;   TOTAL KNEE ARTHROPLASTY  Left 10/02/2020   Procedure: TOTAL KNEE ARTHROPLASTY;  Surgeon: Duwayne Purchase, MD;  Location: [facility];  Service: Orthopedics;  Laterality: Left;  2.5 hrs    Current Outpatient Medications  Medication Sig Dispense Refill   albuterol  (VENTOLIN  HFA) 108 (90 Base) MCG/ACT inhaler INHALE 2 PUFFS BY MOUTH EVERY 6 HOURS AS NEEDED FOR WHEEZING 8.5 each 3   amLODipine  (NORVASC ) 10 MG tablet TAKE 1 TABLET BY MOUTH EVERY DAY 90 tablet 1   aspirin  EC 81 MG tablet Take 1 tablet (81 mg total) by mouth in the morning and at bedtime. Swallow whole. 30 tablet 11   atorvastatin  (LIPITOR) 10 MG tablet TAKE 1 TABLET (10 MG TOTAL) BY MOUTH DAILY. CAN RESTART ONCE PER WEEK, INCREASE AS TOLERATED TO DAILY. 90 tablet 1   benzonatate  (TESSALON ) 100 MG capsule Take 1 capsule (100 mg total) by mouth 3 (three) times daily as needed for cough. 20 capsule 0   cholecalciferol (VITAMIN D3) 25 MCG (1000 UNIT) tablet Take 1,000 Units by mouth daily.     EPINEPHrine  0.3 mg/0.3 mL IJ SOAJ injection Inject 0.3 mg into the muscle as needed for anaphylaxis. 1 each 0   fluticasone  (FLONASE ) 50 MCG/ACT nasal spray Place 2 sprays into both nostrils daily as needed (allergies.). 48 mL 1   fluticasone -salmeterol (WIXELA INHUB) 250-50 MCG/ACT AEPB Inhale 1 puff into the lungs 2 (two) times daily. in the morning and at bedtime. 60 each 5   hydrOXYzine  (VISTARIL ) 25 MG capsule Take 1 capsule (25 mg total) by mouth every 8 (eight) hours as needed. 30 capsule 3   ipratropium (ATROVENT ) 0.06 % nasal spray PLACE 1 SPRAY INTO BOTH NOSTRILS 4 (FOUR) TIMES DAILY. AS NEEDED FOR NASAL CONGESTION 15 mL 1   lisinopril -hydrochlorothiazide  (ZESTORETIC ) 20-25 MG tablet TAKE 1 TABLET BY MOUTH EVERY DAY 90 tablet 1   sertraline  (ZOLOFT ) 50 MG tablet TAKE 1 TABLET BY MOUTH DAILY. WITH FOOD. 90 tablet 1   No current facility-administered medications for this visit.    Allergies as of 08/04/2024 - Review Complete 08/04/2024  Allergen Reaction Noted    Peanut-containing drug products Anaphylaxis 01/28/2016   Shellfish allergy Hives 10/02/2017   Cephalexin Rash 08/04/2021    Family History  Problem Relation Age of Onset   Hypertension Mother    Asthma Mother    Alcohol abuse Father    Heart disease Father 61       AMI age 72   Stroke Father 16       CVA   Hypertension Sister    Hypertension Sister    Hypertension Sister    Hypertension Sister    Hypertension Brother    Hyperlipidemia Brother    Hypertension Brother    Colon cancer Neg Hx    Colitis Neg Hx    Esophageal cancer Neg Hx    Stomach cancer Neg Hx    Rectal cancer Neg Hx     Social  History   Tobacco Use   Smoking status: Never   Smokeless tobacco: Never  Vaping Use   Vaping status: Never Used  Substance Use Topics   Alcohol use: No    Alcohol/week: 0.0 standard drinks of alcohol   Drug use: No     Review of Systems:    Constitutional: No weight loss. Has felt feverish at times but has not check temp at home Cardiovascular: No chest pain Respiratory: No new SOB  Gastrointestinal: See HPI and otherwise negative Hematologic: No bleeding     Physical Exam:  Vital signs: BP 132/84   Pulse 84   Ht 5' 5 (1.651 m)   Wt 248 lb 9.6 oz (112.8 kg)   BMI 41.37 kg/m   Wt Readings from Last 3 Encounters:  08/04/24 248 lb 9.6 oz (112.8 kg)  06/14/24 244 lb 6.4 oz (110.9 kg)  06/07/24 242 lb 6.4 oz (110 kg)   Constitutional: Pleasant female in NAD but appears uncomfortable, alert and cooperative Head:  Normocephalic and atraumatic.  Eyes: No scleral icterus. Conjunctiva pink. Mouth: No oral lesions. Respiratory: Respirations even and unlabored. Lungs clear to auscultation bilaterally.  No wheezes, crackles, or rhonchi.  Cardiovascular:  Regular rate and rhythm. No murmurs. No peripheral edema. Gastrointestinal:  Soft, nondistended, tender to palpation of upper abdomen, primarily epigastrium. No rebound or guarding. Normal bowel sounds. No appreciable  masses or hepatomegaly. Rectal:  Not performed.  Neurologic:  Alert and oriented x4;  grossly normal neurologically.  Skin:   Dry and intact without significant lesions or rashes. Psychiatric: Oriented to person, place and time. Demonstrates good judgement and reason without abnormal affect or behaviors.   RELEVANT LABS AND IMAGING: CBC    Component Value Date/Time   WBC 6.2 06/14/2024 1328   RBC 5.12 (H) 06/14/2024 1328   HGB 13.5 06/14/2024 1328   HGB 13.6 07/19/2020 1524   HCT 41.1 06/14/2024 1328   HCT 40.4 07/19/2020 1524   PLT 264.0 06/14/2024 1328   PLT 331 06/10/2018 0859   MCV 80.4 06/14/2024 1328   MCV 81 07/19/2020 1524   MCH 27.1 10/05/2020 0335   MCHC 32.8 06/14/2024 1328   RDW 13.8 06/14/2024 1328   RDW 12.7 07/19/2020 1524   LYMPHSABS 2.7 07/19/2020 1524   MONOABS 0.4 07/10/2010 2336   EOSABS 0.3 07/19/2020 1524   BASOSABS 0.1 07/19/2020 1524    CMP     Component Value Date/Time   NA 139 06/14/2024 1328   NA 138 07/19/2020 1524   K 3.4 (L) 06/14/2024 1328   CL 102 06/14/2024 1328   CO2 28 06/14/2024 1328   GLUCOSE 119 (H) 06/14/2024 1328   BUN 17 06/14/2024 1328   BUN 12 07/19/2020 1524   CREATININE 0.84 06/14/2024 1328   CREATININE 0.80 03/21/2016 1507   CALCIUM  9.6 06/14/2024 1328   PROT 8.2 06/14/2024 1328   PROT 7.8 07/19/2020 1524   ALBUMIN 4.8 06/14/2024 1328   ALBUMIN 4.7 07/19/2020 1524   AST 29 06/14/2024 1328   ALT 29 06/14/2024 1328   ALKPHOS 95 06/14/2024 1328   BILITOT 0.6 06/14/2024 1328   BILITOT 0.6 07/19/2020 1524   GFRNONAA >60 10/03/2020 0312   GFRAA 89 07/19/2020 1524     Assessment/Plan:   Upper abdominal pain Nausea and vomiting Mild acid reflux Elevated lipase Patient seen today for evaluation of abdominal pain, nausea and vomiting, and mildly elevated lipase level over the last few months.  States that she has frequent nausea and has  been vomiting occasionally after meals, particularly with certain foods such as  fried chicken, salads, and sandwiches.  She has been avoiding dietary triggers but still feels unwell.  Reports constant RUQ abdominal pain.  Recent CT with no source of pain identified, normal pancreas, normal appearance of the gallbladder, mild hepatic steatosis. States that her abdominal pain is constant, 6/10 in severity, has kept her from sleeping at night on occasion. She has been having some intermittent, mild acid reflux which is new in the last couple of months, though she says she had problems with this 30 to 40 years ago. On most recent labs she had normal liver and kidney function, no evidence of anemia or infection, and lipase level was 115. On exam today she appears uncomfortable but in no acute distress.  She is tender to palpation of her upper abdomen, with increased tenderness to palpation of her epigastrium.  Discussed ED precautions. Discussed going to ED now vs trying outpatient management. Will order labs and stat US  and if concerning findings will advise ED. She is aware of what to look out for and is agreeable to go to the ED if needed.  - Will order stat abdominal ultrasound - Labs today: CBC, CMP, lipase - Start Protonix  40 mg daily - Further workup pending findings   Camie Furbish, PA-C [facility] 08/04/2024, 8:31 AM    [facility] Attending   I agree with the Advanced Practitioner's note, impression and recommendations with the following additions:  US  showed possible gallstone vs artifact in neck of gallbladder otherwise ok.  HIDA is ordered  If that is unhelpful would schedule EGD  Lupita CHARLENA Commander, MD, [facility]    Patient Care Team: Nicole Reyes SAUNDERS, MD as PCP - General (Family Medicine) Melita Drivers, MD as Consulting Physician (Orthopedic Surgery) Fleeta Zerita DASEN, MD as Consulting Physician (Ophthalmology) Commander Lupita BRAVO, MD as Consulting Physician (Gastroenterology) Burnetta Aures, MD as Consulting Physician (Orthopedic Surgery) Duwayne Reyes, MD as Consulting Physician (Orthopedic Surgery)

## 2024-08-08 ENCOUNTER — Other Ambulatory Visit: Payer: Self-pay

## 2024-08-08 DIAGNOSIS — K8020 Calculus of gallbladder without cholecystitis without obstruction: Secondary | ICD-10-CM

## 2024-08-08 DIAGNOSIS — R1010 Upper abdominal pain, unspecified: Secondary | ICD-10-CM

## 2024-08-08 DIAGNOSIS — R112 Nausea with vomiting, unspecified: Secondary | ICD-10-CM

## 2024-08-08 NOTE — Progress Notes (Signed)
 Left message on machine to call back

## 2024-08-15 ENCOUNTER — Ambulatory Visit: Payer: Self-pay | Admitting: Gastroenterology

## 2024-08-15 ENCOUNTER — Other Ambulatory Visit: Payer: Self-pay | Admitting: Family Medicine

## 2024-08-15 ENCOUNTER — Encounter (HOSPITAL_COMMUNITY)
Admission: RE | Admit: 2024-08-15 | Discharge: 2024-08-15 | Disposition: A | Discharge status: Home / Self Care | Source: Ambulatory Visit | Attending: Gastroenterology | Admitting: Gastroenterology

## 2024-08-15 DIAGNOSIS — R1010 Upper abdominal pain, unspecified: Secondary | ICD-10-CM | POA: Diagnosis not present

## 2024-08-15 DIAGNOSIS — R112 Nausea with vomiting, unspecified: Secondary | ICD-10-CM | POA: Diagnosis not present

## 2024-08-15 DIAGNOSIS — K8020 Calculus of gallbladder without cholecystitis without obstruction: Secondary | ICD-10-CM | POA: Diagnosis not present

## 2024-08-15 DIAGNOSIS — R052 Subacute cough: Secondary | ICD-10-CM

## 2024-08-15 MED ORDER — TECHNETIUM TC 99M MEBROFENIN IV KIT
5.3000 | PACK | Freq: Once | INTRAVENOUS | Status: AC
  Administered 2024-08-15: 5.3 via INTRAVENOUS

## 2024-08-15 NOTE — Telephone Encounter (Signed)
 Please advise on refill.

## 2024-08-16 NOTE — Telephone Encounter (Signed)
 Patient returning call. Please advise

## 2024-08-17 ENCOUNTER — Telehealth: Payer: Self-pay | Admitting: Gastroenterology

## 2024-08-17 NOTE — Progress Notes (Signed)
 Left message on machine to call back

## 2024-08-17 NOTE — Telephone Encounter (Deleted)
 PT returning call to discuss labs. Please advile

## 2024-08-17 NOTE — Telephone Encounter (Signed)
 The pt has been advised of the results and EGD order see separate note

## 2024-08-17 NOTE — Telephone Encounter (Signed)
 Inbound call from patient requesting to speak to the nurse in regards to her abdominal pain. Patient is already schedule to see Dr. Avram on October the 2 nd for her abdominal pain. Patient stated that he pain in getting to be severe. Please advise.

## 2024-09-12 ENCOUNTER — Other Ambulatory Visit: Payer: Self-pay | Admitting: Family Medicine

## 2024-09-12 DIAGNOSIS — I10 Essential (primary) hypertension: Secondary | ICD-10-CM

## 2024-09-12 DIAGNOSIS — E785 Hyperlipidemia, unspecified: Secondary | ICD-10-CM

## 2024-09-13 ENCOUNTER — Other Ambulatory Visit: Payer: Self-pay | Admitting: Family Medicine

## 2024-09-13 DIAGNOSIS — I10 Essential (primary) hypertension: Secondary | ICD-10-CM

## 2024-09-14 ENCOUNTER — Telehealth: Payer: Self-pay

## 2024-09-14 ENCOUNTER — Other Ambulatory Visit

## 2024-09-14 ENCOUNTER — Ambulatory Visit: Admitting: Internal Medicine

## 2024-09-14 ENCOUNTER — Other Ambulatory Visit (INDEPENDENT_AMBULATORY_CARE_PROVIDER_SITE_OTHER)

## 2024-09-14 ENCOUNTER — Encounter: Payer: Self-pay | Admitting: Internal Medicine

## 2024-09-14 VITALS — BP 148/84 | HR 75 | Ht 66.0 in | Wt 248.0 lb

## 2024-09-14 DIAGNOSIS — R112 Nausea with vomiting, unspecified: Secondary | ICD-10-CM | POA: Diagnosis not present

## 2024-09-14 DIAGNOSIS — R1010 Upper abdominal pain, unspecified: Secondary | ICD-10-CM | POA: Diagnosis not present

## 2024-09-14 DIAGNOSIS — R748 Abnormal levels of other serum enzymes: Secondary | ICD-10-CM

## 2024-09-14 LAB — AMYLASE: Amylase: 137 U/L — ABNORMAL HIGH (ref 27–131)

## 2024-09-14 LAB — LIPASE: Lipase: 97 U/L — ABNORMAL HIGH (ref 11.0–59.0)

## 2024-09-14 NOTE — Patient Instructions (Signed)
  Your provider has requested that you go to the basement level for lab work before leaving today. Press "B" on the elevator. The lab is located at the first door on the left as you exit the elevator.    We will be in touch with results and plans.    I appreciate the opportunity to care for you. Silvano Rusk, MD, [facility]

## 2024-09-14 NOTE — Telephone Encounter (Signed)
 Please call her and tell her since amylase and lipase are both up I suspect she has a condition where the lipase and amylase molecules are large and we can do a blood test to find out. She can cancel the EGD for now and we can reschedule if needed as blood test may not be back before the EGD.  I spoke with Nicole Reyes and she will come back to have the blood test drawn since they were unable to add to the blood drawn today. EGD cancelled.

## 2024-09-14 NOTE — Addendum Note (Signed)
 Addended by: Audley Hinojos E on: 09/14/2024 01:01 PM   Modules accepted: Orders

## 2024-09-14 NOTE — Addendum Note (Signed)
 Addended by: AVRAM LUPITA BRAVO on: 09/14/2024 01:45 PM   Modules accepted: Orders

## 2024-09-14 NOTE — Progress Notes (Addendum)
 Nicole Reyes 68 y.o. 1956/03/19 [id number]  Assessment & Plan:   Encounter Diagnoses  Name Primary?   Elevated lipase Yes   Nausea and vomiting, unspecified vomiting type    Pain of upper abdomen    Mildly elevated lipase of unclear etiology Normal liver chemistries and CBC. Previous CT scan showed no pancreatic pathology.  There was an abnormality in the neck of the gallbladder of unclear etiology question stone versus artifact HIDA scan was negative.  Symptoms appear to have resolved with Protonix .  Will recheck lipase and check an amylase.  If she has a persistently significantly elevated lipase may need to continue on with EGD next week.  If not then cancel.  Keep in mind that she could have had symptomatic cholelithiasis though overall based upon the symptom profile and what we know I doubt it.  Would anticipate 2 months of Protonix  and then discontinue unless she does come to EGD and that indicates otherwise.  CC: Nicole Reyes SAUNDERS, MD   Amylase and lipase both came back elevated.  This makes me suspicious of macro lipase EMEA macro amylase EMEA which go together.  We will test for that and cancel her EGD and reschedule if needed.  Lab Results  Component Value Date   LIPASE 97.0 (H) 09/14/2024   Lab Results  Component Value Date   AMYLASE 137 (H) 09/14/2024    Subjective:   Chief Complaint: Follow-up of abdominal pain nausea vomiting and elevated lipase  HPI Nicole Reyes  is a 68 year old female who presents for follow-up of elevated lipase, abdominal pain, nausea, and vomiting.  She initially experienced constant right upper quadrant abdominal pain, nausea, and vomiting over the summer. She was evaluated by Camie Furbish PA-C in August of this year - please see that note for further details.   During the initial workup, she had a mildly elevated lipase level of 76, with normal liver chemistries and CBC. An abdominal ultrasound suggested a possible gallstone in  the neck, but this was not confirmed on a normal HIDA scan. A prior CT scan in July 2025 showed mild fatty liver, a uterine fibroid, and atherosclerosis, but no pathology to explain her symptoms.  She was started on Protonix  during the initial visit. No current abdominal pain or vomiting, although she reports feeling pressure but no pain.  States she feels well at this time, she is requesting to cancel her EGD scheduled for October 7 but is willing to do it if thought necessary.   Wt Readings from Last 3 Encounters:  09/14/24 248 lb (112.5 kg)  08/04/24 248 lb 9.6 oz (112.8 kg)  06/14/24 244 lb 6.4 oz (110.9 kg)     Allergies  Allergen Reactions   Peanut-Containing Drug Products Anaphylaxis    Also walnuts and almonds    Shellfish Allergy Hives   Cephalexin Rash   Current Meds  Medication Sig   albuterol  (VENTOLIN  HFA) 108 (90 Base) MCG/ACT inhaler INHALE 2 PUFFS BY MOUTH EVERY 6 HOURS AS NEEDED FOR WHEEZING   amLODipine  (NORVASC ) 10 MG tablet TAKE 1 TABLET BY MOUTH EVERY DAY   aspirin  EC 81 MG tablet Take 1 tablet (81 mg total) by mouth in the morning and at bedtime. Swallow whole.   atorvastatin  (LIPITOR) 10 MG tablet TAKE 1 TABLET (10 MG TOTAL) BY MOUTH DAILY. CAN RESTART ONCE PER WEEK, INCREASE AS TOLERATED TO DAILY.   benzonatate  (TESSALON ) 100 MG capsule Take 1 capsule (100 mg total) by mouth 3 (three) times daily  as needed for cough.   cholecalciferol (VITAMIN D3) 25 MCG (1000 UNIT) tablet Take 1,000 Units by mouth daily.   EPINEPHrine  0.3 mg/0.3 mL IJ SOAJ injection Inject 0.3 mg into the muscle as needed for anaphylaxis.   fluticasone  (FLONASE ) 50 MCG/ACT nasal spray Place 2 sprays into both nostrils daily as needed (allergies.).   fluticasone -salmeterol (WIXELA INHUB) 250-50 MCG/ACT AEPB Inhale 1 puff into the lungs 2 (two) times daily. in the morning and at bedtime.   hydrOXYzine  (VISTARIL ) 25 MG capsule Take 1 capsule (25 mg total) by mouth every 8 (eight) hours as needed.    ipratropium (ATROVENT ) 0.06 % nasal spray USE 1 SPRAY IN EACH NOSTRIL 4 TIMES A DAY AS NEEDED FOR NASAL CONGESTION   lisinopril -hydrochlorothiazide  (ZESTORETIC ) 20-25 MG tablet TAKE 1 TABLET BY MOUTH EVERY DAY   pantoprazole  (PROTONIX ) 40 MG tablet Take 1 tablet (40 mg total) by mouth daily.   sertraline  (ZOLOFT ) 50 MG tablet TAKE 1 TABLET BY MOUTH DAILY. WITH FOOD.   Past Medical History:  Diagnosis Date   Allergy    Anxiety    Arthritis    Asthma    Glaucoma    Hypertension    Past Surgical History:  Procedure Laterality Date   COLONOSCOPY  06/29/2011   Normal   COLONOSCOPY&ENDOSCOPY  08/15/1979   PT UNSURE PLACE&MD WHO DID EXAMS=NORMAL   DILATION AND CURETTAGE OF UTERUS     LAPAROSCOPY     REMOVED SCARE TISSUE FROM OVARIES   ROTATOR CUFF REPAIR  04/24/2022   TOTAL KNEE ARTHROPLASTY Right 08/31/2018   Procedure: RIGHT  TOTAL KNEE ARTHROPLASTY;  Surgeon: Duwayne Purchase, MD;  Location: [facility];  Service: Orthopedics;  Laterality: Right;   TOTAL KNEE ARTHROPLASTY Left 10/02/2020   Procedure: TOTAL KNEE ARTHROPLASTY;  Surgeon: Duwayne Purchase, MD;  Location: [facility];  Service: Orthopedics;  Laterality: Left;  2.5 hrs   Social History   Social History Narrative   Marital status:  Single; not dating      Children: none      Lives: alone      Employment:  Copywriter, [employer]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      Tobacco: none      Alcohol: none      Exercise:  Four days per week.   family history includes Alcohol abuse in her father; Asthma in her mother; Heart disease (age of onset: 25) in her father; Hyperlipidemia in her brother; Hypertension in her brother, brother, mother, sister, sister, sister, and sister; Stroke (age of onset: 62) in her father.   Review of Systems See HPI  Objective:   Physical Exam BP (!) 148/84   Pulse 75   Ht 5' 6 (1.676 m)   Wt 248 lb (112.5 kg)   BMI 40.03 kg/m  Abdomen obese soft nontender no mass  Data reviewed see HPI but I have reviewed prior imaging  studies labs and previous GI notes

## 2024-09-19 ENCOUNTER — Encounter: Admitting: Internal Medicine

## 2024-09-19 NOTE — Telephone Encounter (Signed)
Patient calling in regards to previous note. Please advise.

## 2024-09-19 NOTE — Telephone Encounter (Signed)
 Nicole Reyes was calling to see if her test had resulted yet. I called [facility] and customer service said it gets sent to a town in [place] so she hopes by Friday it will result.

## 2024-09-20 ENCOUNTER — Ambulatory Visit: Payer: Self-pay | Admitting: Internal Medicine

## 2024-09-20 DIAGNOSIS — R748 Abnormal levels of other serum enzymes: Secondary | ICD-10-CM

## 2024-09-20 DIAGNOSIS — R1013 Epigastric pain: Secondary | ICD-10-CM

## 2024-09-29 LAB — AMYLASE ISOENZYMES WITH REFLEX TO MACROAMYLASE
Amylase: 128 U/L — ABNORMAL HIGH (ref 21–101)
Isoamylase-Pancreatic: 52 U/L — ABNORMAL HIGH (ref 16–46)
Salivary Fraction: 76 U/L — ABNORMAL HIGH (ref 4–61)

## 2024-09-29 LAB — MACROAMYLASE

## 2024-09-29 NOTE — Telephone Encounter (Signed)
 Patient requesting f/u call in regards to previous note. Please advise.

## 2024-10-02 ENCOUNTER — Encounter: Payer: Self-pay | Admitting: Family Medicine

## 2024-10-02 ENCOUNTER — Ambulatory Visit (INDEPENDENT_AMBULATORY_CARE_PROVIDER_SITE_OTHER): Admitting: Family Medicine

## 2024-10-02 VITALS — BP 142/78 | HR 91 | Temp 98.5°F | Wt 254.0 lb

## 2024-10-02 DIAGNOSIS — J22 Unspecified acute lower respiratory infection: Secondary | ICD-10-CM | POA: Diagnosis not present

## 2024-10-02 DIAGNOSIS — J4541 Moderate persistent asthma with (acute) exacerbation: Secondary | ICD-10-CM | POA: Diagnosis not present

## 2024-10-02 DIAGNOSIS — R051 Acute cough: Secondary | ICD-10-CM | POA: Diagnosis not present

## 2024-10-02 MED ORDER — PREDNISONE 20 MG PO TABS: 40.0000 mg | ORAL_TABLET | Freq: Every day | ORAL | 0 refills | Status: DC

## 2024-10-02 MED ORDER — ALBUTEROL SULFATE HFA 108 (90 BASE) MCG/ACT IN AERS: INHALATION_SPRAY | RESPIRATORY_TRACT | 3 refills | Status: AC

## 2024-10-02 MED ORDER — ALBUTEROL SULFATE (2.5 MG/3ML) 0.083% IN NEBU: 2.5000 mg | INHALATION_SOLUTION | Freq: Once | RESPIRATORY_TRACT | Status: AC

## 2024-10-02 MED ORDER — AZITHROMYCIN 250 MG PO TABS: ORAL_TABLET | ORAL | 0 refills | Status: AC

## 2024-10-02 NOTE — Patient Instructions (Signed)
 Sorry you are not feeling well.  I think getting back on albuterol  as needed will be helpful as the albuterol  neb today did provide a little bit of relief to the wheeze.  I did send antibiotic for the worsening cough and discolored mucus in case there is a bacteria cause to lower respiratory infection with this worsening cough.  Prednisone  2 pills/day for the next 3 days should really help the wheeze but you can still use albuterol  every 4-6 hours if needed for breakthrough wheezing or shortness of breath.  I expect you to be improving in the next 2 to 3 days, but if any worsening symptoms please be seen.  Take care!

## 2024-10-02 NOTE — Progress Notes (Signed)
 Subjective:  Patient ID: Nicole Nicole, female    DOB: 1956-01-27  Age: 68 y.o. MRN: [id number]  CC:  Chief Complaint  Patient presents with   Cough    Congestion, SOB and cough. Started Thursday but seems to be progressing     HPI Nicole Nicole presents for   Cough congestion, dyspnea Started  4 days ago with watery eyes, itchy eyes, sneezing, next day more cough, congestion. No fever. No sick contacts. No home testing.  Flu and covid booster few weeks ago.  Couch, congestion, then more short of breath 2 days ago - unable to use her albuterol  - ran out. History of moderate persistent asthma, treated with Wixela, albuterol  as needed.has been using wixela, flonase  consistently   Tried otc mucinex , cough syrup.  Productive cough with yellow mucus past few days.     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   History of arthroplasty of right knee 06/11/2020   Acute otitis media 01/08/2019   Sore throat 01/08/2019   Osteoarthritis of right knee 08/31/2018   Right knee DJD 08/31/2018   Allergic reaction 05/23/2018   Allergic urticaria 05/23/2018   Perennial and seasonal allergic rhinitis 05/23/2018   Allergic conjunctivitis 05/23/2018   Food allergy 05/23/2018   Carpal tunnel syndrome of left wrist 01/25/2018   Lattice degeneration 11/20/2017   Dry eye syndrome 11/20/2017   Cataracts, bilateral 11/20/2017   Chronic right SI joint pain 01/28/2016   Nut allergy 01/28/2016   Obesity 01/25/2015   Pure hypercholesterolemia 02/10/2014   Moderate persistent asthma 12/19/2012   Hypertensive disorder 12/19/2012   Past Medical History:  Diagnosis Date   Allergy    Anxiety    Arthritis    Asthma    Glaucoma    Hypertension    Past  Surgical History:  Procedure Laterality Date   COLONOSCOPY  06/29/2011   Normal   COLONOSCOPY&ENDOSCOPY  08/15/1979   PT UNSURE PLACE&MD WHO DID EXAMS=NORMAL   DILATION AND CURETTAGE OF UTERUS     LAPAROSCOPY     REMOVED SCARE TISSUE FROM OVARIES   ROTATOR CUFF REPAIR  04/24/2022   TOTAL KNEE ARTHROPLASTY Right 08/31/2018   Procedure: RIGHT  TOTAL KNEE ARTHROPLASTY;  Surgeon: Duwayne Purchase, MD;  Location: [facility];  Service: Orthopedics;  Laterality: Right;   TOTAL KNEE ARTHROPLASTY Left 10/02/2020   Procedure: TOTAL KNEE ARTHROPLASTY;  Surgeon: Duwayne Purchase, MD;  Location: [facility];  Service: Orthopedics;  Laterality: Left;  2.5 hrs   Allergies  Allergen Reactions   Peanut-Containing Drug Products Anaphylaxis    Also walnuts and almonds    Shellfish Allergy Hives   Cephalexin Rash   Prior to Admission medications   Medication Sig Start Date End Date Taking? Authorizing Provider  albuterol  (VENTOLIN  HFA) 108 (90 Base) MCG/ACT inhaler INHALE 2 PUFFS BY MOUTH EVERY 6 HOURS AS NEEDED FOR WHEEZING 06/11/23  Yes Levora Purchase SAUNDERS, MD  amLODipine  (NORVASC ) 10 MG tablet TAKE 1 TABLET BY MOUTH EVERY DAY 09/13/24  Yes Levora Purchase SAUNDERS, MD  aspirin  EC 81 MG tablet Take 1 tablet (81 mg total) by mouth in the morning and at bedtime. Swallow whole. 10/04/20  Yes Bissell, Jaclyn M, PA-C  atorvastatin  (LIPITOR) 10 MG tablet TAKE 1 TABLET (  10 MG TOTAL) BY MOUTH DAILY. CAN RESTART ONCE PER WEEK, INCREASE AS TOLERATED TO DAILY. 09/12/24  Yes [name], MD  benzonatate  (TESSALON ) 100 MG capsule Take 1 capsule (100 mg total) by mouth 3 (three) times daily as needed for cough. 06/07/24  Yes [name], MD  cholecalciferol (VITAMIN D3) 25 MCG (1000 UNIT) tablet Take 1,000 Units by mouth daily.   Yes [provider]  EPINEPHrine  0.3 mg/0.3 mL IJ SOAJ injection Inject 0.3 mg into the muscle as needed for anaphylaxis. 04/26/24  Yes [name], MD  fluticasone  (FLONASE ) 50 MCG/ACT  nasal spray Place 2 sprays into both nostrils daily as needed (allergies.). 06/07/24  Yes [name], MD  fluticasone -salmeterol (WIXELA INHUB) 250-50 MCG/ACT AEPB Inhale 1 puff into the lungs 2 (two) times daily. in the morning and at bedtime. 04/26/24  Yes [name], MD  hydrOXYzine  (VISTARIL ) 25 MG capsule Take 1 capsule (25 mg total) by mouth every 8 (eight) hours as needed. 08/02/23  Yes [name], MD  ipratropium (ATROVENT ) 0.06 % nasal spray USE 1 SPRAY IN EACH NOSTRIL 4 TIMES A DAY AS NEEDED FOR NASAL CONGESTION 08/15/24  Yes [name], MD  lisinopril -hydrochlorothiazide  (ZESTORETIC ) 20-25 MG tablet TAKE 1 TABLET BY MOUTH EVERY DAY 09/12/24  Yes [name], MD  pantoprazole  (PROTONIX ) 40 MG tablet Take 1 tablet (40 mg total) by mouth daily. 08/04/24  Yes Heinz, Sara E, PA-C  sertraline  (ZOLOFT ) 50 MG tablet TAKE 1 TABLET BY MOUTH DAILY. WITH FOOD. 04/17/24  Yes [name], MD   Social History   Socioeconomic History   Marital status: Single    Spouse name: n/a   Number of children: 0   Years of education: Not on file   Highest education level: Not on file  Occupational History   Occupation: employed    Employer: [employer]  Tobacco Use   Smoking status: Never   Smokeless tobacco: Never  Vaping Use   Vaping status: Never Used  Substance and Sexual Activity   Alcohol use: No    Alcohol/week: 0.0 standard drinks of alcohol   Drug use: No   Sexual activity: Yes    Birth control/protection: Post-menopausal  Other Topics Concern   Not on file  Social History Narrative   Marital status:  Single; not dating      Children: none      Lives: alone      Employment:  Copywriter, [employer]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      Tobacco: none      Alcohol: none      Exercise:  Four days per week.   Social Drivers of Corporate investment banker Strain: Not on file  Food Insecurity: Not on file  Transportation Needs: Not on file  Physical Activity: Not on file   Stress: Not on file  Social Connections: Not on file  Intimate Partner Violence: Not on file    Review of Systems Per HPI.   Objective:   Vitals:   10/02/24 1355 10/02/24 1406  BP: (!) 150/90 (!) 142/78  Pulse: 91   Temp: 98.5 F (36.9 C)   TempSrc: Temporal   SpO2: 97%   Weight: 254 lb (115.2 kg)   Elevated bp noted - coughing, wheezing at times during visit.   Physical Exam Vitals reviewed.  Constitutional:      General: She is not in acute distress.    Appearance: She is well-developed.  HENT:  Head: Normocephalic and atraumatic.     Right Ear: Hearing, tympanic membrane, ear canal and external ear normal.     Left Ear: Hearing, tympanic membrane, ear canal and external ear normal.     Nose: Nose normal.     Mouth/Throat:     Pharynx: No posterior oropharyngeal erythema.  Eyes:     Conjunctiva/sclera: Conjunctivae normal.     Pupils: Pupils are equal, round, and reactive to light.  Cardiovascular:     Rate and Rhythm: Normal rate and regular rhythm.     Heart sounds: Normal heart sounds. No murmur heard. Pulmonary:     Effort: Pulmonary effort is normal. No respiratory distress.     Breath sounds: Wheezing (diffuse on initial exam, no distress, speaking in full sentences, but does feel dyspneic.) present. No rhonchi.  Skin:    General: Skin is warm and dry.     Findings: No rash.  Neurological:     Mental Status: She is alert and oriented to person, place, and time.  Psychiatric:        Mood and Affect: Mood normal.        Behavior: Behavior normal.   3:04 PM Repeat exam after albuterol  2.5 mg neb.  Less wheezing, faint end expiratory wheeze still present, few coarse breath sounds bases bilaterally.  Improved aeration, symptomatically improved.  Assessment & Plan:  Nicole Nicole is a 68 y.o. female . Acute cough  Moderate persistent asthma with acute exacerbation - Plan: albuterol  (VENTOLIN  HFA) 108 (90 Base) MCG/ACT inhaler, albuterol   (PROVENTIL ) (2.5 MG/3ML) 0.083% nebulizer solution 2.5 mg, predniSONE  (DELTASONE ) 20 MG tablet  LRTI (lower respiratory tract infection) - Plan: azithromycin  (ZITHROMAX ) 250 MG tablet Initially suspected flare of asthma, and unfortunately has been out of albuterol .  However with worsening productive cough, discolored phlegm, possible secondary bacterial infection, atypical infection.  Few coarse breath sounds, rhonchi after neb treatment.  Hold on imaging at this time, start azithromycin , prednisone  for asthma flare, refilled albuterol  as needed and ER/RTC precautions given if not improving in the next few days as I would want to check x-ray at that time.  Potential side effects and risks of meds were discussed with understanding of plan expressed.  Meds ordered this encounter  Medications   albuterol  (VENTOLIN  HFA) 108 (90 Base) MCG/ACT inhaler    Sig: INHALE 2 PUFFS BY MOUTH EVERY 6 HOURS AS NEEDED FOR WHEEZING    Dispense:  8.5 each    Refill:  3   albuterol  (PROVENTIL ) (2.5 MG/3ML) 0.083% nebulizer solution 2.5 mg   azithromycin  (ZITHROMAX ) 250 MG tablet    Sig: Take 2 tablets on day 1, then 1 tablet daily on days 2 through 5    Dispense:  6 tablet    Refill:  0   predniSONE  (DELTASONE ) 20 MG tablet    Sig: Take 2 tablets (40 mg total) by mouth daily with breakfast.    Dispense:  6 tablet    Refill:  0   Patient Instructions  Sorry you are not feeling well.  I think getting back on albuterol  as needed will be helpful as the albuterol  neb today did provide a little bit of relief to the wheeze.  I did send antibiotic for the worsening cough and discolored mucus in case there is a bacteria cause to lower respiratory infection with this worsening cough.  Prednisone  2 pills/day for the next 3 days should really help the wheeze but you can still use albuterol  every 4-6 hours if  needed for breakthrough wheezing or shortness of breath.  I expect you to be improving in the next 2 to 3 days, but if  any worsening symptoms please be seen.  Take care!    Signed,   Nicole Pines, MD [facility], [facility] 10/02/24 3:04 PM

## 2024-10-02 NOTE — Telephone Encounter (Signed)
 Per lab results Dr Avram advised her to do EGD. She wished to proceed. Instructions to be mailed to her. Confirmed address. She has done an EGD before and will call with any questions when she receives the instructions. She is set up for 10/10/2024 at 12:30pm. We went over the general instructions today on the phone.

## 2024-10-03 ENCOUNTER — Encounter: Payer: Self-pay | Admitting: Internal Medicine

## 2024-10-06 ENCOUNTER — Telehealth: Payer: Self-pay

## 2024-10-06 NOTE — Telephone Encounter (Signed)
 Nicole Reyes is going to come by Monday AM and pick up her instructions. I will put them up front.

## 2024-10-06 NOTE — Telephone Encounter (Signed)
 Kinzley still hasn't received her instructions in the mail so I have emailed them to her. Confirmed email address.

## 2024-10-06 NOTE — Telephone Encounter (Signed)
 Patient returning call states she did not receive email

## 2024-10-09 NOTE — Progress Notes (Unsigned)
 [facility] History and Physical   Primary Care Physician:  [name], MD   Reason for Procedure:    Encounter Diagnoses  Name Primary?   Abdominal pain, epigastric Yes   Elevated amylase    Elevated lipase      Plan:    colonoscopy     HPI: Nicole Reyes is a 68 y.o. female here for evaluation of epigastric pain. US  RUQ as below and subsequent HID w/ EF NL. Amylase isoenzmes and macroamylasemia - some salivary and pancreatic amylase but no macroamylase.  IMPRESSION: 1. No acute sonographic abnormality identified. 2. Possible shadowing echogenic structure measuring approximately 1-1.5 cm at the level of the gallbladder neck could represent a gallstone or artifact. No sonographic evidence of acute cholecystitis.     Past Medical History:  Diagnosis Date   Allergy    Anxiety    Arthritis    Asthma    Glaucoma    Hypertension     Past Surgical History:  Procedure Laterality Date   COLONOSCOPY  06/29/2011   Normal   COLONOSCOPY&ENDOSCOPY  08/15/1979   PT UNSURE PLACE&MD WHO DID EXAMS=NORMAL   DILATION AND CURETTAGE OF UTERUS     LAPAROSCOPY     REMOVED SCARE TISSUE FROM OVARIES   ROTATOR CUFF REPAIR  04/24/2022   TOTAL KNEE ARTHROPLASTY Right 08/31/2018   Procedure: RIGHT  TOTAL KNEE ARTHROPLASTY;  Surgeon: Duwayne Reyes, MD;  Location: [facility];  Service: Orthopedics;  Laterality: Right;   TOTAL KNEE ARTHROPLASTY Left 10/02/2020   Procedure: TOTAL KNEE ARTHROPLASTY;  Surgeon: Duwayne Reyes, MD;  Location: [facility];  Service: Orthopedics;  Laterality: Left;  2.5 hrs     Current Outpatient Medications  Medication Sig Dispense Refill   albuterol  (VENTOLIN  HFA) 108 (90 Base) MCG/ACT inhaler INHALE 2 PUFFS BY MOUTH EVERY 6 HOURS AS NEEDED FOR WHEEZING 8.5 each 3   amLODipine  (NORVASC ) 10 MG tablet TAKE 1 TABLET BY MOUTH EVERY DAY 90 tablet 1   aspirin  EC 81 MG tablet Take 1 tablet (81 mg total) by mouth in the morning and at bedtime.  Swallow whole. 30 tablet 11   atorvastatin  (LIPITOR) 10 MG tablet TAKE 1 TABLET (10 MG TOTAL) BY MOUTH DAILY. CAN RESTART ONCE PER WEEK, INCREASE AS TOLERATED TO DAILY. 90 tablet 1   benzonatate  (TESSALON ) 100 MG capsule Take 1 capsule (100 mg total) by mouth 3 (three) times daily as needed for cough. 20 capsule 0   cholecalciferol (VITAMIN D3) 25 MCG (1000 UNIT) tablet Take 1,000 Units by mouth daily.     EPINEPHrine  0.3 mg/0.3 mL IJ SOAJ injection Inject 0.3 mg into the muscle as needed for anaphylaxis. 1 each 0   fluticasone  (FLONASE ) 50 MCG/ACT nasal spray Place 2 sprays into both nostrils daily as needed (allergies.). 48 mL 1   fluticasone -salmeterol (WIXELA INHUB) 250-50 MCG/ACT AEPB Inhale 1 puff into the lungs 2 (two) times daily. in the morning and at bedtime. 60 each 5   hydrOXYzine  (VISTARIL ) 25 MG capsule Take 1 capsule (25 mg total) by mouth every 8 (eight) hours as needed. 30 capsule 3   ipratropium (ATROVENT ) 0.06 % nasal spray USE 1 SPRAY IN EACH NOSTRIL 4 TIMES A DAY AS NEEDED FOR NASAL CONGESTION 45 mL 1   lisinopril -hydrochlorothiazide  (ZESTORETIC ) 20-25 MG tablet TAKE 1 TABLET BY MOUTH EVERY DAY 90 tablet 1   pantoprazole  (PROTONIX ) 40 MG tablet Take 1 tablet (40 mg total) by mouth daily. 90 tablet 3   predniSONE  (DELTASONE ) 20 MG  tablet Take 2 tablets (40 mg total) by mouth daily with breakfast. 6 tablet 0   sertraline  (ZOLOFT ) 50 MG tablet TAKE 1 TABLET BY MOUTH DAILY. WITH FOOD. 90 tablet 1   Current Facility-Administered Medications  Medication Dose Route Frequency Provider Last Rate Last Admin   albuterol  (PROVENTIL ) (2.5 MG/3ML) 0.083% nebulizer solution 2.5 mg  2.5 mg Nebulization Once         Allergies as of 10/10/2024 - Review Complete 10/02/2024  Allergen Reaction Noted   Peanut-containing drug products Anaphylaxis 01/28/2016   Shellfish allergy Hives 10/02/2017   Cephalexin Rash 08/04/2021    Family History  Problem Relation Age of Onset   Hypertension  Mother    Asthma Mother    Alcohol abuse Father    Heart disease Father 19       AMI age 60   Stroke Father 33       CVA   Hypertension Sister    Hypertension Sister    Hypertension Sister    Hypertension Sister    Hypertension Brother    Hyperlipidemia Brother    Hypertension Brother    Colon cancer Neg Hx    Colitis Neg Hx    Esophageal cancer Neg Hx    Stomach cancer Neg Hx    Rectal cancer Neg Hx     Social History   Socioeconomic History   Marital status: Single    Spouse name: n/a   Number of children: 0   Years of education: Not on file   Highest education level: Not on file  Occupational History   Occupation: employed    Associate Professor: [employer]  Tobacco Use   Smoking status: Never   Smokeless tobacco: Never  Vaping Use   Vaping status: Never Used  Substance and Sexual Activity   Alcohol use: No    Alcohol/week: 0.0 standard drinks of alcohol   Drug use: No   Sexual activity: Yes    Birth control/protection: Post-menopausal  Other Topics Concern   Not on file  Social History Narrative   Marital status:  Single; not dating      Children: none      Lives: alone      Employment:  Copywriter, [employer]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      Tobacco: none      Alcohol: none      Exercise:  Four days per week.   Social Drivers of Corporate Investment Banker Strain: Not on file  Food Insecurity: Not on file  Transportation Needs: Not on file  Physical Activity: Not on file  Stress: Not on file  Social Connections: Not on file  Intimate Partner Violence: Not on file    Review of Systems: Positive for *** All other review of systems negative except as mentioned in the HPI.  Physical Exam: Vital signs There were no vitals taken for this visit.  General:   Alert,  Well-developed, well-nourished, pleasant and cooperative in NAD Lungs:  Clear throughout to auscultation.   Heart:  Regular rate and rhythm; no murmurs, clicks, rubs,  or gallops. Abdomen:  Soft, nontender and  nondistended. Normal bowel sounds.   Neuro/Psych:  Alert and cooperative. Normal mood and affect. A and O x 3   @Lindsey Demonte  CHARLENA Commander, MD, [facility] [phone] (pager) 10/09/2024 7:51 PM@

## 2024-10-09 NOTE — Progress Notes (Signed)
 Nicole Reyes                                          MRN: [id number]   10/09/2024   The [facility] Quality Team Specialist reviewed this patient medical record for the purposes of chart review for care gap closure. The following were reviewed: chart review for care gap closure-controlling blood pressure.    [facility] Quality Team

## 2024-10-10 ENCOUNTER — Ambulatory Visit: Admitting: Internal Medicine

## 2024-10-10 ENCOUNTER — Encounter: Payer: Self-pay | Admitting: Internal Medicine

## 2024-10-10 VITALS — BP 114/81 | HR 89 | Temp 97.5°F | Resp 20 | Ht 66.0 in | Wt 254.0 lb

## 2024-10-10 DIAGNOSIS — B9681 Helicobacter pylori [H. pylori] as the cause of diseases classified elsewhere: Secondary | ICD-10-CM | POA: Diagnosis not present

## 2024-10-10 DIAGNOSIS — R1013 Epigastric pain: Secondary | ICD-10-CM

## 2024-10-10 DIAGNOSIS — K2950 Unspecified chronic gastritis without bleeding: Secondary | ICD-10-CM | POA: Diagnosis not present

## 2024-10-10 DIAGNOSIS — I10 Essential (primary) hypertension: Secondary | ICD-10-CM | POA: Diagnosis not present

## 2024-10-10 DIAGNOSIS — R748 Abnormal levels of other serum enzymes: Secondary | ICD-10-CM

## 2024-10-10 DIAGNOSIS — F419 Anxiety disorder, unspecified: Secondary | ICD-10-CM | POA: Diagnosis not present

## 2024-10-10 DIAGNOSIS — J45909 Unspecified asthma, uncomplicated: Secondary | ICD-10-CM | POA: Diagnosis not present

## 2024-10-10 MED ORDER — SODIUM CHLORIDE 0.9 % IV SOLN: 500.0000 mL | INTRAVENOUS | Status: AC

## 2024-10-10 NOTE — Patient Instructions (Signed)
 YOU HAD AN ENDOSCOPIC PROCEDURE TODAY AT THE [facility]:   Refer to the procedure report that was given to you for any specific questions about what was found during the examination.  If the procedure report does not answer your questions, please call your gastroenterologist to clarify.  If you requested that your care partner not be given the details of your procedure findings, then the procedure report has been included in a sealed envelope for you to review at your convenience later.  YOU SHOULD EXPECT: Some feelings of bloating in the abdomen. Passage of more gas than usual.  Walking can help get rid of the air that was put into your GI tract during the procedure and reduce the bloating. If you had a lower endoscopy (such as a colonoscopy or flexible sigmoidoscopy) you may notice spotting of blood in your stool or on the toilet paper. If you underwent a bowel prep for your procedure, you may not have a normal bowel movement for a few days.  Please Note:  You might notice some irritation and congestion in your nose or some drainage.  This is from the oxygen  used during your procedure.  There is no need for concern and it should clear up in a day or so.  SYMPTOMS TO REPORT IMMEDIATELY:  Following upper endoscopy (EGD)  Vomiting of blood or coffee ground material  New chest pain or pain under the shoulder blades  Painful or persistently difficult swallowing  New shortness of breath  Fever of 100F or higher  Black, tarry-looking stools  For urgent or emergent issues, a gastroenterologist can be reached at any hour by calling [phone]. Do not use MyChart messaging for urgent concerns.    DIET:  We do recommend a small meal at first, but then you may proceed to your regular diet.  Drink plenty of fluids but you should avoid alcoholic beverages for 24 hours.  MEDICATIONS: Continue present medications.  FOLLOW UP: Await pathology results. Consider MRCP vs EUS given possible  gallbladder neck stoe +the elevated lipase and amylase.  Thank you for allowing us  to provide for your healthcare needs today.  ACTIVITY:  You should plan to take it easy for the rest of today and you should NOT DRIVE or use heavy machinery until tomorrow (because of the sedation medicines used during the test).    FOLLOW UP: Our staff will call the number listed on your records the next business day following your procedure.  We will call around 7:15- 8:00 am to check on you and address any questions or concerns that you may have regarding the information given to you following your procedure. If we do not reach you, we will leave a message.     If any biopsies were taken you will be contacted by phone or by letter within the next 1-3 weeks.  Please call us  at [phone] if you have not heard about the biopsies in 3 weeks.    SIGNATURES/CONFIDENTIALITY: You and/or your care partner have signed paperwork which will be entered into your electronic medical record.  These signatures attest to the fact that that the information above on your After Visit Summary has been reviewed and is understood.  Full responsibility of the confidentiality of this discharge information lies with you and/or your care-partner.

## 2024-10-10 NOTE — Progress Notes (Signed)
 Sedate, gd SR, tolerated procedure well, VSS, report to RN

## 2024-10-10 NOTE — Progress Notes (Signed)
 Pt's states no medical or surgical changes since previsit or office visit.

## 2024-10-10 NOTE — Progress Notes (Signed)
 Called to room to assist during endoscopic procedure.  Patient ID and intended procedure confirmed with present staff. Received instructions for my participation in the procedure from the performing physician.

## 2024-10-10 NOTE — Op Note (Signed)
 [facility] Patient Name: Nicole Reyes Procedure Date: 10/10/2024 1:14 PM MRN: [id number] Endoscopist: Lupita FORBES Commander , MD, [id number] Age: 68 Referring MD:  Date of Birth: December 11, 1956 Gender: Female Account #: [id number] Procedure:                Upper GI endoscopy Indications:              Epigastric abdominal pain, elevated lipase and                            amylase (not macroamylasemia) CT negative, US                             possible neck stone, NL HIDA + EF Medicines:                Monitored Anesthesia Care Procedure:                Pre-Anesthesia Assessment:                           - Prior to the procedure, a History and Physical                            was performed, and patient medications and                            allergies were reviewed. The patient's tolerance of                            previous anesthesia was also reviewed. The risks                            and benefits of the procedure and the sedation                            options and risks were discussed with the patient.                            All questions were answered, and informed consent                            was obtained. Prior Anticoagulants: The patient has                            taken no anticoagulant or antiplatelet agents. ASA                            Grade Assessment: III - A patient with severe                            systemic disease. After reviewing the risks and                            benefits, the patient was deemed in satisfactory  condition to undergo the procedure.                           After obtaining informed consent, the endoscope was                            passed under direct vision. Throughout the                            procedure, the patient's blood pressure, pulse, and                            oxygen  saturations were monitored continuously. The                            GIF [id number]  #[id number] was introduced through the                            mouth, and advanced to the second part of duodenum.                            The upper GI endoscopy was accomplished without                            difficulty. The patient tolerated the procedure                            well. Scope In: Scope Out: Findings:                 Patchy mildly erythematous mucosa without bleeding                            was found in the gastric antrum. Biopsies were                            taken with a cold forceps for histology.                            Verification of patient identification for the                            specimen was done. Estimated blood loss was minimal.                           The gastroesophageal flap valve was visualized                            endoscopically and classified as Hill Grade II                            (fold present, opens with respiration).                           The exam was otherwise without abnormality.  The cardia and gastric fundus were normal on                            retroflexion. Complications:            No immediate complications. Estimated Blood Loss:     Estimated blood loss was minimal. Impression:               - Erythematous mucosa in the antrum. Biopsied.                           - Gastroesophageal flap valve classified as Hill                            Grade II (fold present, opens with respiration).                           - The examination was otherwise normal. Recommendation:           - Patient has a contact number available for                            emergencies. The signs and symptoms of potential                            delayed complications were discussed with the                            patient. Return to normal activities tomorrow.                            Written discharge instructions were provided to the                            patient.                            - Resume previous diet.                           - Continue present medications.                           - Await pathology results. Consider MRCP vs EUS                            given possible gallbladder neck stoe + the elevated                            lipase and amylase Lupita FORBES Commander, MD 10/10/2024 1:36:22 PM This report has been signed electronically.

## 2024-10-11 ENCOUNTER — Telehealth: Payer: Self-pay | Admitting: *Deleted

## 2024-10-11 NOTE — Telephone Encounter (Signed)
  Follow up Call-     10/10/2024   12:41 PM  Call back number  Post procedure Call Back phone  # [phone]  Permission to leave phone message Yes     Patient questions:  Do you have a fever, pain , or abdominal swelling? No. Pain Score  0 *  Have you tolerated food without any problems? Yes.    Have you been able to return to your normal activities? Yes.    Do you have any questions about your discharge instructions: Diet   No. Medications  No. Follow up visit  No.  Do you have questions or concerns about your Care? No.  Actions: * If pain score is 4 or above: No action needed, pain <4.

## 2024-10-16 LAB — SURGICAL PATHOLOGY

## 2024-10-19 ENCOUNTER — Ambulatory Visit: Payer: Self-pay | Admitting: Internal Medicine

## 2024-10-19 DIAGNOSIS — R748 Abnormal levels of other serum enzymes: Secondary | ICD-10-CM

## 2024-10-19 DIAGNOSIS — B9681 Helicobacter pylori [H. pylori] as the cause of diseases classified elsewhere: Secondary | ICD-10-CM | POA: Insufficient documentation

## 2024-10-19 MED ORDER — BISMUTH SUBSALICYLATE 262 MG PO CHEW: 524.0000 mg | CHEWABLE_TABLET | Freq: Four times a day (QID) | ORAL | 0 refills | Status: AC

## 2024-10-19 MED ORDER — METRONIDAZOLE 250 MG PO TABS: 250.0000 mg | ORAL_TABLET | Freq: Four times a day (QID) | ORAL | 0 refills | Status: AC

## 2024-10-19 MED ORDER — DOXYCYCLINE HYCLATE 100 MG PO TABS: 100.0000 mg | ORAL_TABLET | Freq: Two times a day (BID) | ORAL | 0 refills | Status: AC

## 2024-10-19 NOTE — Telephone Encounter (Signed)
 Patient has H pylori gastritis  I have sent Rxs please let her know to take these meds this way  1) Pantoprazole  40 mg a day x 14 d (she has it) 2) Pepto Bismol 2 tabs (262 mg each) 4 times a day x 14 d 3) Metronidazole  250 mg 4 times a day x 14 d 4) doxycycline  100 mg 2 times a day x 14 d  After 14 d stop pantoprazole  also  In early Jan around Jan 5 after treatment completed do H. Pylori stool antigen - dx H. Pylori gastritis  Also needs to do a lipase and amylase when she turns this in -   All ordered

## 2024-10-23 NOTE — Telephone Encounter (Signed)
 Pt called and started she did not receive the email with the instructions. Resent instructions to [email or web address].

## 2024-10-26 DIAGNOSIS — H04123 Dry eye syndrome of bilateral lacrimal glands: Secondary | ICD-10-CM | POA: Diagnosis not present

## 2024-10-26 DIAGNOSIS — H43393 Other vitreous opacities, bilateral: Secondary | ICD-10-CM | POA: Diagnosis not present

## 2024-10-26 DIAGNOSIS — H02831 Dermatochalasis of right upper eyelid: Secondary | ICD-10-CM | POA: Diagnosis not present

## 2024-10-26 DIAGNOSIS — H524 Presbyopia: Secondary | ICD-10-CM | POA: Diagnosis not present

## 2024-10-26 DIAGNOSIS — H02834 Dermatochalasis of left upper eyelid: Secondary | ICD-10-CM | POA: Diagnosis not present

## 2024-10-30 DIAGNOSIS — K219 Gastro-esophageal reflux disease without esophagitis: Secondary | ICD-10-CM | POA: Diagnosis not present

## 2024-10-30 DIAGNOSIS — Z79899 Other long term (current) drug therapy: Secondary | ICD-10-CM | POA: Diagnosis not present

## 2024-10-30 DIAGNOSIS — J310 Chronic rhinitis: Secondary | ICD-10-CM | POA: Diagnosis not present

## 2024-10-30 DIAGNOSIS — K529 Noninfective gastroenteritis and colitis, unspecified: Secondary | ICD-10-CM | POA: Diagnosis not present

## 2024-10-30 DIAGNOSIS — J45909 Unspecified asthma, uncomplicated: Secondary | ICD-10-CM | POA: Diagnosis not present

## 2024-10-30 DIAGNOSIS — R32 Unspecified urinary incontinence: Secondary | ICD-10-CM | POA: Diagnosis not present

## 2024-10-30 DIAGNOSIS — Z5900 Homelessness unspecified: Secondary | ICD-10-CM | POA: Diagnosis not present

## 2024-10-30 DIAGNOSIS — F325 Major depressive disorder, single episode, in full remission: Secondary | ICD-10-CM | POA: Diagnosis not present

## 2024-10-30 DIAGNOSIS — Z91010 Allergy to peanuts: Secondary | ICD-10-CM | POA: Diagnosis not present

## 2024-10-30 DIAGNOSIS — F411 Generalized anxiety disorder: Secondary | ICD-10-CM | POA: Diagnosis not present

## 2024-10-30 DIAGNOSIS — Z6838 Body mass index (BMI) 38.0-38.9, adult: Secondary | ICD-10-CM | POA: Diagnosis not present

## 2024-10-30 DIAGNOSIS — Z91013 Allergy to seafood: Secondary | ICD-10-CM | POA: Diagnosis not present

## 2024-10-30 DIAGNOSIS — Z792 Long term (current) use of antibiotics: Secondary | ICD-10-CM | POA: Diagnosis not present

## 2024-10-30 DIAGNOSIS — E785 Hyperlipidemia, unspecified: Secondary | ICD-10-CM | POA: Diagnosis not present

## 2024-10-30 DIAGNOSIS — I10 Essential (primary) hypertension: Secondary | ICD-10-CM | POA: Diagnosis not present

## 2024-10-30 DIAGNOSIS — Z7982 Long term (current) use of aspirin: Secondary | ICD-10-CM | POA: Diagnosis not present

## 2024-12-20 ENCOUNTER — Ambulatory Visit: Payer: Self-pay

## 2024-12-20 NOTE — Telephone Encounter (Signed)
 FYI Only or Action Required?: FYI only for provider: appointment scheduled on 12/21/24.  Patient was last seen in primary care on 10/02/2024 by [name], MD.  Called Nurse Triage reporting Back Pain.  Symptoms began about a month ago.  Interventions attempted: OTC medications: Acetaminophen .  Symptoms are: gradually worsening.  Triage Disposition: See Physician Within 24 Hours  Patient/caregiver understands and will follow disposition?: Yes  Reason for Disposition  Numbness in a leg or foot (i.e., loss of sensation)  Answer Assessment - Initial Assessment Questions 7/10 lower back pain radiating down right leg with numbness to right big toe starting in December. Denies any other symptoms. Office visit scheduled.   1. ONSET: When did the pain begin? (e.g., minutes, hours, days)     Patient has intermittent back pain from pinched nerve, started to feel it about a month ago  2. LOCATION: Where does it hurt? (upper, mid or lower back)     Lower back down to upper buttocks  3. SEVERITY: How bad is the pain?  (e.g., Scale 1-10; mild, moderate, or severe)     7/10  4. PATTERN: Is the pain constant? (e.g., yes, no; constant, intermittent)      Yes  5. RADIATION: Does the pain shoot into your legs or somewhere else?     Yes, radiates down right leg  6. CAUSE:  What do you think is causing the back pain?      Patient states that she has pinched nerve  7. BACK OVERUSE:  Any recent lifting of heavy objects, strenuous work or exercise?     No  8. MEDICINES: What have you taken so far for the pain? (e.g., nothing, acetaminophen , NSAIDS)     Acetaminophen   9. NEUROLOGIC SYMPTOMS: Do you have any weakness, numbness, or problems with bowel/bladder control?     Numbness in right big toe  10. OTHER SYMPTOMS: Do you have any other symptoms? (e.g., fever, abdomen pain, burning with urination, blood in urine)       Denies any other symptoms  11. PREGNANCY: Is  there any chance you are pregnant? When was your last menstrual period?       NA  Protocols used: Back Pain-A-AH  Copied from CRM [id number]. Topic: Clinical - Red Word Triage >> Dec 20, 2024 12:44 PM Jayma L wrote: Red Word that prompted transfer to Nurse Triage: lower back pain and hard to walk , pain is getting worse

## 2024-12-21 ENCOUNTER — Encounter: Payer: Self-pay | Admitting: Family Medicine

## 2024-12-21 ENCOUNTER — Ambulatory Visit: Admitting: Family Medicine

## 2024-12-21 VITALS — BP 120/80 | HR 64 | Temp 98.1°F | Resp 18 | Ht 66.0 in | Wt 251.6 lb

## 2024-12-21 DIAGNOSIS — R739 Hyperglycemia, unspecified: Secondary | ICD-10-CM | POA: Diagnosis not present

## 2024-12-21 DIAGNOSIS — M5410 Radiculopathy, site unspecified: Secondary | ICD-10-CM | POA: Diagnosis not present

## 2024-12-21 LAB — POCT GLYCOSYLATED HEMOGLOBIN (HGB A1C): Hemoglobin A1C: 6.3 % — AB (ref 4.0–5.6)

## 2024-12-21 LAB — GLUCOSE, POCT (MANUAL RESULT ENTRY): POC Glucose: 134 mg/dL — AB (ref 70–99)

## 2024-12-21 MED ORDER — METHYLPREDNISOLONE 4 MG PO TBPK: ORAL_TABLET | ORAL | 0 refills | Status: AC

## 2024-12-21 NOTE — Progress Notes (Unsigned)
 "  Subjective:  Patient ID: Nicole Reyes, female    DOB: January 16, 1956  Age: 69 y.o. MRN: [id number]  CC:  Chief Complaint  Patient presents with   Back Pain    Low back pain. Right side and down right leg. Unbearable to walk. Sx started awhile ago and it is getting worse    HPI Nicole Reyes presents for   Low back pain Suspected sciatica in the past, discussed in 2024, 2025.  Prior epidural spinal injection in L5-S1, Dr. Burnetta, Dr. Bonner at [facility] In July 2022. No recent ortho visit. Prior notes in 01/2021 indicated MRI demonstrating multifactorial moderate to severe spinal stenosis at L4-5 with grade 1 spondylolisthesis.  Suspected L5 radiculopathy from spinal stenosis at that time.  Epidural spinal injection on 01/29/2021.  With continued symptoms fusion surgery was given as an option versus ongoing injections therapy or formalized PT.  Underwent physical therapy - reports nmo change - made pain worse.   Initially had deferred surgery until 2023. No ortho follow up since 2023.   Back pain off and on for years, similar pain as in past. More notable past month. NKI, no new exercises. Pain in R low back - radiating to R leg. Similar to prior sx's.  No recent prednisone  or injections. Same back pain and leg pain as in 2022.  Had been a little better for a few years.   No bowel or bladder incontinence, no saddle anesthesia, no lower extremity weakness. No fever, weight loss, night sweats.   Tx: tylenol , alleve, no relief.    Tolerated prednisone  in the past.   No difficulty with sleep.   Glucose 124 in 07/2024.  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   History of arthroplasty of right knee 06/11/2020   Acute otitis media 01/08/2019   Sore throat 01/08/2019   Osteoarthritis of right knee 08/31/2018   Right knee DJD 08/31/2018   Allergic reaction 05/23/2018   Allergic urticaria 05/23/2018   Perennial and seasonal allergic rhinitis 05/23/2018   Allergic conjunctivitis 05/23/2018   Food allergy 05/23/2018   Carpal tunnel syndrome of left wrist 01/25/2018   Lattice degeneration 11/20/2017   Dry eye syndrome 11/20/2017   Cataracts, bilateral 11/20/2017   Chronic right SI joint pain 01/28/2016   Nut allergy 01/28/2016   Obesity 01/25/2015   Pure hypercholesterolemia 02/10/2014   Moderate persistent asthma 12/19/2012   Hypertensive disorder 12/19/2012   Past Medical History:  Diagnosis Date   Allergy    Anxiety    Arthritis    Asthma    Glaucoma    Hypertension    Past Surgical History:  Procedure Laterality Date   COLONOSCOPY  06/29/2011   Normal   COLONOSCOPY&ENDOSCOPY  08/15/1979   PT UNSURE PLACE&MD WHO DID EXAMS=NORMAL   DILATION AND CURETTAGE OF UTERUS     LAPAROSCOPY     REMOVED SCARE TISSUE FROM OVARIES   ROTATOR CUFF REPAIR  04/24/2022   TOTAL KNEE ARTHROPLASTY Right 08/31/2018   Procedure: RIGHT  TOTAL KNEE ARTHROPLASTY;  Surgeon: Duwayne Purchase, MD;  Location: [facility];  Service: Orthopedics;  Laterality: Right;  TOTAL KNEE ARTHROPLASTY Left 10/02/2020   Procedure: TOTAL KNEE ARTHROPLASTY;  Surgeon: Duwayne Purchase, MD;  Location: [facility];  Service: Orthopedics;  Laterality: Left;  2.5 hrs   Allergies[1] Prior to Admission medications  Medication Sig Start Date End Date Taking? Authorizing Provider  albuterol  (VENTOLIN  HFA) 108 (90 Base) MCG/ACT inhaler INHALE 2 PUFFS BY MOUTH EVERY 6 HOURS AS NEEDED FOR WHEEZING 10/02/24  Yes Levora Purchase SAUNDERS, MD  amLODipine  (NORVASC ) 10 MG tablet TAKE 1 TABLET BY MOUTH EVERY DAY 09/13/24  Yes Levora Purchase SAUNDERS, MD  aspirin  EC 81 MG tablet Take 1 tablet (81  mg total) by mouth in the morning and at bedtime. Swallow whole. 10/04/20  Yes Bissell, Jaclyn M, PA-C  atorvastatin  (LIPITOR) 10 MG tablet TAKE 1 TABLET (10 MG TOTAL) BY MOUTH DAILY. CAN RESTART ONCE PER WEEK, INCREASE AS TOLERATED TO DAILY. 09/12/24  Yes Levora Purchase SAUNDERS, MD  cholecalciferol (VITAMIN D3) 25 MCG (1000 UNIT) tablet Take 1,000 Units by mouth daily.   Yes [provider]  EPINEPHrine  0.3 mg/0.3 mL IJ SOAJ injection Inject 0.3 mg into the muscle as needed for anaphylaxis. 04/26/24  Yes Levora Purchase SAUNDERS, MD  fluticasone  (FLONASE ) 50 MCG/ACT nasal spray Place 2 sprays into both nostrils daily as needed (allergies.). 06/07/24  Yes Levora Purchase SAUNDERS, MD  fluticasone -salmeterol (WIXELA INHUB) 250-50 MCG/ACT AEPB Inhale 1 puff into the lungs 2 (two) times daily. in the morning and at bedtime. 04/26/24  Yes Levora Purchase SAUNDERS, MD  hydrOXYzine  (VISTARIL ) 25 MG capsule Take 1 capsule (25 mg total) by mouth every 8 (eight) hours as needed. 08/02/23  Yes Levora Purchase SAUNDERS, MD  ipratropium (ATROVENT ) 0.06 % nasal spray USE 1 SPRAY IN EACH NOSTRIL 4 TIMES A DAY AS NEEDED FOR NASAL CONGESTION 08/15/24  Yes Levora Purchase SAUNDERS, MD  lisinopril -hydrochlorothiazide  (ZESTORETIC ) 20-25 MG tablet TAKE 1 TABLET BY MOUTH EVERY DAY 09/12/24  Yes Levora Purchase SAUNDERS, MD  pantoprazole  (PROTONIX ) 40 MG tablet Take 1 tablet (40 mg total) by mouth daily. 08/04/24  Yes Heinz, Sara E, PA-C  sertraline  (ZOLOFT ) 50 MG tablet TAKE 1 TABLET BY MOUTH DAILY. WITH FOOD. 04/17/24  Yes Levora Purchase SAUNDERS, MD  benzonatate  (TESSALON ) 100 MG capsule Take 1 capsule (100 mg total) by mouth 3 (three) times daily as needed for cough. Patient not taking: Reported on 12/21/2024 06/07/24   Levora Purchase SAUNDERS, MD  predniSONE  (DELTASONE ) 20 MG tablet Take 2 tablets (40 mg total) by mouth daily with breakfast. Patient not taking: Reported on 12/21/2024 10/02/24   Levora Purchase SAUNDERS, MD   Social History   Socioeconomic History   Marital status:  Single    Spouse name: n/a   Number of children: 0   Years of education: Not on file   Highest education level: Not on file  Occupational History   Occupation: employed    Employer: [employer]  Tobacco Use   Smoking status: Never   Smokeless tobacco: Never  Vaping Use   Vaping status: Never Used  Substance and Sexual Activity   Alcohol use: No    Alcohol/week: 0.0 standard drinks of alcohol   Drug use: No   Sexual activity: Yes    Birth control/protection: Post-menopausal  Other Topics Concern   Not on file  Social History Narrative   Marital status:  Single; not dating      Children: none      Lives: alone      Employment:  Copywriter, [employer]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  Tobacco: none      Alcohol: none      Exercise:  Four days per week.   Social Drivers of Health   Tobacco Use: Low Risk (12/21/2024)   Patient History    Smoking Tobacco Use: Never    Smokeless Tobacco Use: Never    Passive Exposure: Not on file  Financial Resource Strain: Not on file  Food Insecurity: Not on file  Transportation Needs: Not on file  Physical Activity: Not on file  Stress: Not on file  Social Connections: Not on file  Intimate Partner Violence: Not on file  Depression (EYV7-0): Medium Risk (04/26/2024)   Depression (PHQ2-9)    PHQ-2 Score: 6  Alcohol Screen: Not on file  Housing: Not on file  Utilities: Not on file  Health Literacy: Not on file    Review of Systems Per HPI.   Objective:   Vitals:   12/21/24 1414  BP: 120/80  Pulse: 64  Resp: 18  Temp: 98.1 F (36.7 C)  TempSrc: Temporal  SpO2: 99%  Weight: 251 lb 9.6 oz (114.1 kg)  Height: 5' 6 (1.676 m)     Physical Exam Vitals reviewed.  Constitutional:      General: She is not in acute distress.    Appearance: Normal appearance. She is well-developed.  HENT:     Head: Normocephalic and atraumatic.  Cardiovascular:     Rate and Rhythm: Normal rate.  Pulmonary:     Effort: Pulmonary effort is normal.   Musculoskeletal:     Comments: No midline bony tenderness of the lumbar spine.  Slight discomfort of the right lower paraspinals.  Positive seated straight leg raise with pain extending down the right lateral leg.  Able to heel and toe walk without difficulty, no assistive devices used.  Overall intact range of motion of the lumbar spine.  Neurological:     Mental Status: She is alert and oriented to person, place, and time.  Psychiatric:        Mood and Affect: Mood normal.     Results for orders placed or performed in visit on 12/21/24  POCT glucose (manual entry)   Collection Time: 12/21/24  3:02 PM  Result Value Ref Range   POC Glucose 134 (A) 70 - 99 mg/dl  POCT glycosylated hemoglobin (Hb A1C)   Collection Time: 12/21/24  3:05 PM  Result Value Ref Range   Hemoglobin A1C 6.3 (A) 4.0 - 5.6 %   HbA1c POC (<> result, manual entry)     HbA1c, POC (prediabetic range)     HbA1c, POC (controlled diabetic range)       Assessment & Plan:  Nicole Reyes is a 69 y.o. female . Radicular low back pain - Plan: Ambulatory referral to Spine Surgery, methylPREDNISolone  (MEDROL  DOSEPAK) 4 MG TBPK tablet  Hyperglycemia - Plan: POCT glycosylated hemoglobin (Hb A1C), POCT glucose (manual entry)  History of moderate to severe spinal stenosis with L5 radiculopathy, low back pain treated by Ortho including epidural spinal injection and physical therapy as above back in 2022.  Had been doing okay until recently, now with persistent pain past month, similar to previous symptoms.  Does appear to be consistent with her prior L5 radiculopathy.  No red flags on exam or history.  Without specific injury will hold on imaging at this time but have referred her back to Ortho to decide on additional treatment options.  For now we will try Medrol  Dosepak, potential side effects and risk discussed.  A1c was  obtained with prior elevated glucose.   Meds ordered this encounter  Medications    methylPREDNISolone  (MEDROL  DOSEPAK) 4 MG TBPK tablet    Sig: Use as directed.    Dispense:  21 each    Refill:  0   Patient Instructions  Unfortunately it sounds like the previous low back pain has returned.  Unfortunately with your previous spinal stenosis, that likely is pinching on the nerve causing the leg symptoms again.  We can try a steroid taper initially, but I have referred you back to orthopedics to discuss other options, including if a repeat injection may be indicated.  Hopefully the steroid from today will help.  If any new or worsening symptoms please be seen.  Hang in there.      Signed,   Reyes Pines, MD [facility], [facility] 12/21/2024 2:57 PM      [1]  Allergies Allergen Reactions   Peanut-Containing Drug Products Anaphylaxis    Also walnuts and almonds    Shellfish Allergy Hives   Cephalexin Rash   "

## 2024-12-21 NOTE — Patient Instructions (Addendum)
 Unfortunately it sounds like the previous low back pain has returned.  Unfortunately with your previous spinal stenosis, that likely is pinching on the nerve causing the leg symptoms again.  We can try a steroid taper initially, but I have referred you back to orthopedics to discuss other options, including if a repeat injection may be indicated.  Hopefully the steroid from today will help.  If any new or worsening symptoms please be seen.  Hang in there.

## 2024-12-25 ENCOUNTER — Telehealth: Payer: Self-pay

## 2024-12-25 ENCOUNTER — Ambulatory Visit: Admitting: Family Medicine

## 2024-12-25 NOTE — Telephone Encounter (Signed)
 Copied from CRM [id number]. Topic: Clinical - Medication Question >> Dec 25, 2024 11:10 AM Carlyon D wrote: Reason for CRM: Pt is calling stating she had an allergic reaction to her medication and had to administer EPI Saturday night methylPREDNISolone  (MEDROL  DOSEPAK) 4 MG TBPK tablet  She is asking if something else can be sent in to the pharmacy  for her as she can not take this medication.    Preferred pharmacy: [id number] [address], [place] - [address] [place] [address] Phone: [phone] Fax: [phone]

## 2024-12-26 NOTE — Telephone Encounter (Signed)
 Called patient and she verbalized understanding

## 2024-12-26 NOTE — Telephone Encounter (Signed)
 I am sorry to hear about her reaction.  It would be unlikely to have an allergic reaction to methylprednisolone  as steroids often will treat allergic reactions, typically do not cause them, and she has taken steroids previously. I recommend she is seen at an appointment to discuss these symptoms and reaction further.  If any return of allergic reaction symptoms, should be seen through the ER.

## 2024-12-26 NOTE — Telephone Encounter (Signed)
 Since it would be unlikely that the methylprednisolone  caused a reaction and if she tolerated dose this morning I think it would be reasonable to continue that medication but to monitor for any new symptoms and be seen right away if she does have any signs or symptoms of allergic reaction.

## 2024-12-26 NOTE — Telephone Encounter (Signed)
 Patient wants to know should she continue medication? She did not take it yesterday. She took it this morning with no reaction. Okay for her to continue. I offered her appt but she declined and just wants to know if she should continue medication or not

## 2024-12-27 ENCOUNTER — Other Ambulatory Visit: Payer: Self-pay | Admitting: Family Medicine

## 2024-12-27 DIAGNOSIS — F411 Generalized anxiety disorder: Secondary | ICD-10-CM

## 2025-01-16 ENCOUNTER — Other Ambulatory Visit: Payer: Self-pay | Admitting: Internal Medicine

## 2025-01-16 ENCOUNTER — Other Ambulatory Visit: Payer: Self-pay | Admitting: Family Medicine

## 2025-01-16 DIAGNOSIS — M5410 Radiculopathy, site unspecified: Secondary | ICD-10-CM

## 2025-01-16 DIAGNOSIS — J4541 Moderate persistent asthma with (acute) exacerbation: Secondary | ICD-10-CM

## 2025-01-30 ENCOUNTER — Ambulatory Visit

## 2025-01-31 ENCOUNTER — Ambulatory Visit
